# Patient Record
Sex: Male | Born: 1975
Health system: Southern US, Community
[De-identification: ages and names within clinical notes are randomized; demographics above are authoritative.]

## PROBLEM LIST (undated history)

## (undated) DIAGNOSIS — M1990 Unspecified osteoarthritis, unspecified site: Secondary | ICD-10-CM

## (undated) DIAGNOSIS — I10 Essential (primary) hypertension: Secondary | ICD-10-CM

## (undated) DIAGNOSIS — J302 Other seasonal allergic rhinitis: Secondary | ICD-10-CM

## (undated) DIAGNOSIS — F329 Major depressive disorder, single episode, unspecified: Secondary | ICD-10-CM

## (undated) DIAGNOSIS — F419 Anxiety disorder, unspecified: Secondary | ICD-10-CM

## (undated) DIAGNOSIS — J45909 Unspecified asthma, uncomplicated: Secondary | ICD-10-CM

## (undated) DIAGNOSIS — F32A Depression, unspecified: Secondary | ICD-10-CM

## (undated) DIAGNOSIS — G4730 Sleep apnea, unspecified: Secondary | ICD-10-CM

## (undated) DIAGNOSIS — E785 Hyperlipidemia, unspecified: Secondary | ICD-10-CM

## (undated) DIAGNOSIS — J189 Pneumonia, unspecified organism: Secondary | ICD-10-CM

## (undated) DIAGNOSIS — K219 Gastro-esophageal reflux disease without esophagitis: Secondary | ICD-10-CM

## (undated) HISTORY — PX: OTHER SURGICAL HISTORY: SHX169

## (undated) HISTORY — DX: Hyperlipidemia, unspecified: E78.5

## (undated) HISTORY — PX: TONSILECTOMY/ADENOIDECTOMY WITH MYRINGOTOMY: SHX6125

## (undated) HISTORY — PX: VASECTOMY: SHX75

## (undated) HISTORY — DX: Unspecified asthma, uncomplicated: J45.909

## (undated) HISTORY — DX: Essential (primary) hypertension: I10

## (undated) HISTORY — PX: EYE SURGERY: SHX253

---
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
## 2013-09-19 DIAGNOSIS — J189 Pneumonia, unspecified organism: Secondary | ICD-10-CM

## 2013-09-19 HISTORY — DX: Pneumonia, unspecified organism: J18.9

## 2013-11-01 ENCOUNTER — Ambulatory Visit (INDEPENDENT_AMBULATORY_CARE_PROVIDER_SITE_OTHER): Payer: BC Managed Care – HMO | Admitting: Physician Assistant

## 2013-11-01 ENCOUNTER — Encounter: Payer: Self-pay | Admitting: Physician Assistant

## 2013-11-01 VITALS — BP 125/77 | HR 79 | Ht >= 80 in | Wt 315.0 lb

## 2013-11-01 DIAGNOSIS — R635 Abnormal weight gain: Secondary | ICD-10-CM

## 2013-11-01 DIAGNOSIS — I10 Essential (primary) hypertension: Secondary | ICD-10-CM

## 2013-11-01 DIAGNOSIS — F32A Depression, unspecified: Secondary | ICD-10-CM

## 2013-11-01 DIAGNOSIS — F411 Generalized anxiety disorder: Secondary | ICD-10-CM

## 2013-11-01 DIAGNOSIS — F3289 Other specified depressive episodes: Secondary | ICD-10-CM

## 2013-11-01 DIAGNOSIS — F329 Major depressive disorder, single episode, unspecified: Secondary | ICD-10-CM

## 2013-11-01 DIAGNOSIS — K219 Gastro-esophageal reflux disease without esophagitis: Secondary | ICD-10-CM

## 2013-11-01 MED ORDER — BUPROPION HCL ER (XL) 150 MG PO TB24: 150.0000 mg | ORAL_TABLET | Freq: Every day | ORAL | Status: DC

## 2013-11-01 MED ORDER — FLUOXETINE HCL 20 MG PO TABS: 20.0000 mg | ORAL_TABLET | Freq: Every day | ORAL | Status: DC

## 2013-11-01 NOTE — Patient Instructions (Signed)
Celexa 40mg  take 1/2 tab of celexa for days and 1/2 tab for prozac for 7 days then stop celexa and start prozac 20mg  daily.   Start Wellbutrin 150mg  in morning once a day.

## 2013-11-01 NOTE — Progress Notes (Signed)
   Subjective:    Patient ID: Elijah Ford, male    DOB: 1975/11/20, 38 y.o.   MRN: [id number]  HPI Pt is a 38 yo male who presents to the clinic to establish care.   Pt is on medication for depression and anxiety. He feels like it is causing him to gain weight. Started on celexa 2 years ago and was 265 pt is now 3. He does feel like depression is controlled but still suffers with anxiety. He has problems going to sleep at night. Denies suicidal or homicidal thoughts. Never tried anything except celexa.   Marland KitchenGERD controlled on omeprazole.   HTN- no CP, paliptations, headaches, vision changes. Bp controlled with toprol.  Active Ambulatory Problems    Diagnosis Date Noted  . Essential hypertension, benign 11/04/2013  . Anxiety state, unspecified 11/04/2013  . Depression 11/04/2013  . Abnormal weight gain 11/04/2013  . GERD (gastroesophageal reflux disease) 11/04/2013   Resolved Ambulatory Problems    Diagnosis Date Noted  . No Resolved Ambulatory Problems   Past Medical History  Diagnosis Date  . Hypertension   . Hyperlipidemia    . Family History  Problem Relation Age of Onset  . Depression Mother   . Cancer Father   . Alcohol abuse Brother   . Depression Brother   . Alcohol abuse Brother   . Depression Brother    . History   Social History  . Marital Status: Married    Spouse Name: N/A    Number of Children: N/A  . Years of Education: N/A   Occupational History  . Not on file.   Social History Main Topics  . Smoking status: Never Smoker   . Smokeless tobacco: Not on file  . Alcohol Use: No  . Drug Use: No  . Sexual Activity: Yes   Other Topics Concern  . Not on file   Social History Narrative  . No narrative on file      Review of Systems  All other systems reviewed and are negative.       Objective:   Physical Exam  Constitutional: He is oriented to person, place, and time. He appears well-developed and well-nourished.  HENT:  Head:  Normocephalic and atraumatic.  Cardiovascular: Normal rate, regular rhythm and normal heart sounds.   Pulmonary/Chest: Effort normal and breath sounds normal.  Neurological: He is alert and oriented to person, place, and time.  Skin: Skin is dry.  Psychiatric: He has a normal mood and affect. His behavior is normal.          Assessment & Plan:  Depression/Anxietyabnormal weight gain/- PHQ-9 was 11 and GAD-7 was 13. Pt feels like celexa could be causing weight gain. Let's try prozac. Discussed taper of 1/2 celexa for 1 week with 1/2 prozac for one week then stop celexa and start 1 full tab prozac. Also added wellbutrin to help with anxiety as well as motivation for weight loss. Discussed regular exercise and starting to chart calories. Follow up in 6 weeks and will consider fasting labs etc.   HTN- Controlled on TOprol daily.   GERD- continue on omeprazole 40mg  daily.

## 2013-11-04 DIAGNOSIS — E6609 Other obesity due to excess calories: Secondary | ICD-10-CM | POA: Insufficient documentation

## 2013-11-04 DIAGNOSIS — E669 Obesity, unspecified: Secondary | ICD-10-CM | POA: Insufficient documentation

## 2013-11-04 DIAGNOSIS — F329 Major depressive disorder, single episode, unspecified: Secondary | ICD-10-CM | POA: Insufficient documentation

## 2013-11-04 DIAGNOSIS — I10 Essential (primary) hypertension: Secondary | ICD-10-CM | POA: Insufficient documentation

## 2013-11-04 DIAGNOSIS — F411 Generalized anxiety disorder: Secondary | ICD-10-CM

## 2013-11-04 DIAGNOSIS — F419 Anxiety disorder, unspecified: Secondary | ICD-10-CM | POA: Insufficient documentation

## 2013-11-04 DIAGNOSIS — K219 Gastro-esophageal reflux disease without esophagitis: Secondary | ICD-10-CM | POA: Insufficient documentation

## 2013-11-04 DIAGNOSIS — F32A Depression, unspecified: Secondary | ICD-10-CM | POA: Insufficient documentation

## 2013-12-18 ENCOUNTER — Ambulatory Visit (INDEPENDENT_AMBULATORY_CARE_PROVIDER_SITE_OTHER): Payer: Commercial Managed Care - PPO | Admitting: Physician Assistant

## 2013-12-18 ENCOUNTER — Encounter: Payer: Self-pay | Admitting: Physician Assistant

## 2013-12-18 VITALS — BP 137/82 | HR 72 | Ht <= 58 in | Wt 304.0 lb

## 2013-12-18 DIAGNOSIS — K219 Gastro-esophageal reflux disease without esophagitis: Secondary | ICD-10-CM | POA: Diagnosis not present

## 2013-12-18 DIAGNOSIS — R635 Abnormal weight gain: Secondary | ICD-10-CM

## 2013-12-18 DIAGNOSIS — R059 Cough, unspecified: Secondary | ICD-10-CM | POA: Diagnosis not present

## 2013-12-18 DIAGNOSIS — J45909 Unspecified asthma, uncomplicated: Secondary | ICD-10-CM

## 2013-12-18 DIAGNOSIS — I10 Essential (primary) hypertension: Secondary | ICD-10-CM | POA: Diagnosis not present

## 2013-12-18 DIAGNOSIS — E669 Obesity, unspecified: Secondary | ICD-10-CM

## 2013-12-18 DIAGNOSIS — F329 Major depressive disorder, single episode, unspecified: Secondary | ICD-10-CM

## 2013-12-18 DIAGNOSIS — F32A Depression, unspecified: Secondary | ICD-10-CM

## 2013-12-18 DIAGNOSIS — F3289 Other specified depressive episodes: Secondary | ICD-10-CM

## 2013-12-18 DIAGNOSIS — R05 Cough: Secondary | ICD-10-CM | POA: Diagnosis not present

## 2013-12-18 DIAGNOSIS — F411 Generalized anxiety disorder: Secondary | ICD-10-CM

## 2013-12-18 MED ORDER — AZITHROMYCIN 250 MG PO TABS: ORAL_TABLET | ORAL | Status: DC

## 2013-12-18 MED ORDER — PHENTERMINE HCL 37.5 MG PO CAPS: 37.5000 mg | ORAL_CAPSULE | ORAL | Status: DC

## 2013-12-18 MED ORDER — BUPROPION HCL ER (XL) 300 MG PO TB24: 300.0000 mg | ORAL_TABLET | Freq: Every day | ORAL | Status: DC

## 2013-12-18 MED ORDER — OMEPRAZOLE 40 MG PO CPDR: 40.0000 mg | DELAYED_RELEASE_CAPSULE | Freq: Every day | ORAL | Status: DC

## 2013-12-18 MED ORDER — ALBUTEROL SULFATE HFA 108 (90 BASE) MCG/ACT IN AERS: 2.0000 | INHALATION_SPRAY | Freq: Four times a day (QID) | RESPIRATORY_TRACT | Status: DC | PRN

## 2013-12-18 MED ORDER — METOPROLOL SUCCINATE ER 50 MG PO TB24: 50.0000 mg | ORAL_TABLET | Freq: Every day | ORAL | Status: DC

## 2013-12-18 MED ORDER — PREDNISONE 50 MG PO TABS
ORAL_TABLET | ORAL | Status: DC
Start: 2013-12-18 — End: 2014-01-07

## 2013-12-18 MED ORDER — IPRATROPIUM-ALBUTEROL 0.5-2.5 (3) MG/3ML IN SOLN
3.0000 mL | Freq: Once | RESPIRATORY_TRACT | Status: AC
  Administered 2013-12-18: 3 mL via RESPIRATORY_TRACT

## 2013-12-18 MED ORDER — HYDROCODONE-HOMATROPINE 5-1.5 MG/5ML PO SYRP: 5.0000 mL | ORAL_SOLUTION | Freq: Every evening | ORAL | Status: DC | PRN

## 2013-12-18 NOTE — Patient Instructions (Signed)
Could be triggered by allergies: zyrtec OTC daily/flonase 2 sprays each nostril.  Acute Bronchitis Bronchitis is inflammation of the airways that extend from the windpipe into the lungs (bronchi). The inflammation often causes mucus to develop. This leads to a cough, which is the most common symptom of bronchitis.  In acute bronchitis, the condition usually develops suddenly and goes away over time, usually in a couple weeks. Smoking, allergies, and asthma can make bronchitis worse. Repeated episodes of bronchitis may cause further lung problems.  CAUSES Acute bronchitis is most often caused by the same virus that causes a cold. The virus can spread from person to person (contagious).  SIGNS AND SYMPTOMS   Cough.   Fever.   Coughing up mucus.   Body aches.   Chest congestion.   Chills.   Shortness of breath.   Sore throat.  DIAGNOSIS  Acute bronchitis is usually diagnosed through a physical exam. Tests, such as chest X-rays, are sometimes done to rule out other conditions.  TREATMENT  Acute bronchitis usually goes away in a couple weeks. Often times, no medical treatment is necessary. Medicines are sometimes given for relief of fever or cough. Antibiotics are usually not needed but may be prescribed in certain situations. In some cases, an inhaler may be recommended to help reduce shortness of breath and control the cough. A cool mist vaporizer may also be used to help thin bronchial secretions and make it easier to clear the chest.  HOME CARE INSTRUCTIONS  Get plenty of rest.   Drink enough fluids to keep your urine clear or pale yellow (unless you have a medical condition that requires fluid restriction). Increasing fluids may help thin your secretions and will prevent dehydration.   Only take over-the-counter or prescription medicines as directed by your health care provider.   Avoid smoking and secondhand smoke. Exposure to cigarette smoke or irritating chemicals  will make bronchitis worse. If you are a smoker, consider using nicotine gum or skin patches to help control withdrawal symptoms. Quitting smoking will help your lungs heal faster.   Reduce the chances of another bout of acute bronchitis by washing your hands frequently, avoiding people with cold symptoms, and trying not to touch your hands to your mouth, nose, or eyes.   Follow up with your health care provider as directed.  SEEK MEDICAL CARE IF: Your symptoms do not improve after 1 week of treatment.  SEEK IMMEDIATE MEDICAL CARE IF:  You develop an increased fever or chills.   You have chest pain.   You have severe shortness of breath.  You have bloody sputum.   You develop dehydration.  You develop fainting.  You develop repeated vomiting.  You develop a severe headache. MAKE SURE YOU:   Understand these instructions.  Will watch your condition.  Will get help right away if you are not doing well or get worse. Document Released: 10/13/2004 Document Revised: 05/08/2013 Document Reviewed: 02/26/2013 [facility] Patient Information 2014 [place].

## 2013-12-19 DIAGNOSIS — R05 Cough: Secondary | ICD-10-CM

## 2013-12-19 DIAGNOSIS — R059 Cough, unspecified: Secondary | ICD-10-CM

## 2013-12-19 DIAGNOSIS — J45909 Unspecified asthma, uncomplicated: Secondary | ICD-10-CM | POA: Diagnosis not present

## 2013-12-19 MED ORDER — METHYLPREDNISOLONE SODIUM SUCC 125 MG IJ SOLR
125.0000 mg | Freq: Once | INTRAMUSCULAR | Status: AC
  Administered 2013-12-19: 125 mg via INTRAMUSCULAR

## 2013-12-19 NOTE — Progress Notes (Signed)
   Subjective:    Patient ID: Soloman Mckeithan, male    DOB: Jan 25, 1976, 38 y.o.   MRN: [id number]  HPI Pt comes for follow up on anxiety and depression but also has a cough to discuss.   Anxiety and depression- doing much better but still struggles with anxious feelings. Not regularly exercising but does work outside. Does feel more motivated. Depression much better. No side effects to medication. Concern with SSRI was weight gain. With change has lost 11lbs. Motivated to lose more. Would like to discuss options.   Cough for one month. Hx of asthma growing up and does have occasional flares. Chest feels very tight. Some wheezing. Often feels SOB. Cough is productive. Worse when he lays down at night. No sick contacts. Denies any fever, chills, sinus pressure, ear pain or ST. Not tried anything to make better. Not used albuterol.   HTN- taking medication daily. Denies any CP, palpitations, Headaches or vision changes.    Review of Systems     Objective:   Physical Exam  Constitutional: He is oriented to person, place, and time. He appears well-developed and well-nourished.  Obese.   HENT:  Head: Normocephalic and atraumatic.  Right Ear: External ear normal.  Left Ear: External ear normal.  Nose: Nose normal.  Mouth/Throat: Oropharynx is clear and moist.  Uvula removed.   Eyes: Conjunctivae are normal. Right eye exhibits no discharge. Left eye exhibits no discharge.  Neck: Normal range of motion. Neck supple.  Cardiovascular: Normal rate, regular rhythm and normal heart sounds.   Pulmonary/Chest:  Excessive coughing made worse with deep breathing. Wheezing heard bilaterally.   Neurological: He is alert and oriented to person, place, and time.  Skin: Skin is dry.  Psychiatric: He has a normal mood and affect. His behavior is normal.          Assessment & Plan:  Asthmatic bronchitis- peak flows in the red and yellow. duoneb given in office today. Wheezing and breathing improved  after nebulizer. Sent rescue inhaler to pharmacy to use every 2-6 hours. Solumedrol 125mg  IM today. Prednisone 50mg  for 5 days. Zpak for 5 days. Hycodan for bedtime.  Anxiety/depression-PHQ-9 was 13. GAD-7 was 10. Numbers have decreased minimally. increased wellbutrin to 300mg  daily. Continue on prozac 20mg  daily. Refilled medication. Vistaril as needed. Follow up in 3 months.   GERD- omeprazole refilled.   HTN- controlled refilled toprol.   Abnormal weight gain/obesity- started phentermine. Discussed SE of insomnia, anxiety, palpitations, increased HR, increased BP. Follow up in 1 month. Encouraged regular diet and exercise.

## 2014-01-07 ENCOUNTER — Encounter: Payer: Self-pay | Admitting: Physician Assistant

## 2014-01-07 ENCOUNTER — Ambulatory Visit (INDEPENDENT_AMBULATORY_CARE_PROVIDER_SITE_OTHER): Payer: Commercial Managed Care - PPO | Admitting: Physician Assistant

## 2014-01-07 VITALS — BP 124/83 | HR 62 | Ht >= 80 in | Wt 298.0 lb

## 2014-01-07 DIAGNOSIS — R635 Abnormal weight gain: Secondary | ICD-10-CM | POA: Diagnosis not present

## 2014-01-07 MED ORDER — PHENTERMINE HCL 37.5 MG PO CAPS: 37.5000 mg | ORAL_CAPSULE | ORAL | Status: DC

## 2014-01-07 NOTE — Progress Notes (Signed)
   Subjective:    Patient ID: Elijah Ford, male    DOB: Oct 05, 1975, 39 y.o.   MRN: [id number]  HPI Patient is a 38 year old male who presents to the clinic to followup on phentermine and to get her refill. He is also total of 17 pounds in a little over a month on phentermine. He has limited and almost stopped drinking sodas. He has added more walking and with his wife and kids. He is doing some yard work as well for exercise. He feels great and denies any chest pains, palpitations, insomnia.     Review of Systems     Objective:   Physical Exam  Constitutional: He appears well-developed and well-nourished.  Obese  HENT:  Head: Normocephalic and atraumatic.  Cardiovascular: Normal rate, regular rhythm and normal heart sounds.   Pulmonary/Chest: Effort normal and breath sounds normal. He has no wheezes.  Psychiatric: He has a normal mood and affect. His behavior is normal.          Assessment & Plan:  Abnormal weight gain-will refill phentermine for another month. Next month we can do a nurse visit only. Encouraged patient to continue diet and exercise. BMI is 32 today. This is very encouraging. Next goals to get BMI under 30. We'll continue to monitor.

## 2014-02-03 ENCOUNTER — Ambulatory Visit (INDEPENDENT_AMBULATORY_CARE_PROVIDER_SITE_OTHER): Payer: Commercial Managed Care - PPO | Admitting: Physician Assistant

## 2014-02-03 ENCOUNTER — Encounter: Payer: Self-pay | Admitting: Physician Assistant

## 2014-02-03 ENCOUNTER — Ambulatory Visit (INDEPENDENT_AMBULATORY_CARE_PROVIDER_SITE_OTHER): Payer: Commercial Managed Care - PPO

## 2014-02-03 VITALS — BP 108/72 | HR 63 | Ht >= 80 in | Wt 291.0 lb

## 2014-02-03 DIAGNOSIS — F411 Generalized anxiety disorder: Secondary | ICD-10-CM | POA: Diagnosis not present

## 2014-02-03 DIAGNOSIS — B359 Dermatophytosis, unspecified: Secondary | ICD-10-CM

## 2014-02-03 DIAGNOSIS — D2370 Other benign neoplasm of skin of unspecified lower limb, including hip: Secondary | ICD-10-CM

## 2014-02-03 DIAGNOSIS — F3289 Other specified depressive episodes: Secondary | ICD-10-CM

## 2014-02-03 DIAGNOSIS — M542 Cervicalgia: Secondary | ICD-10-CM

## 2014-02-03 DIAGNOSIS — D2270 Melanocytic nevi of unspecified lower limb, including hip: Secondary | ICD-10-CM

## 2014-02-03 DIAGNOSIS — F32A Depression, unspecified: Secondary | ICD-10-CM

## 2014-02-03 DIAGNOSIS — F329 Major depressive disorder, single episode, unspecified: Secondary | ICD-10-CM

## 2014-02-03 MED ORDER — CYCLOBENZAPRINE HCL 10 MG PO TABS: 10.0000 mg | ORAL_TABLET | Freq: Three times a day (TID) | ORAL | Status: DC | PRN

## 2014-02-03 MED ORDER — TERBINAFINE HCL 1 % EX CREA: 1.0000 "application " | TOPICAL_CREAM | Freq: Two times a day (BID) | CUTANEOUS | Status: DC

## 2014-02-03 MED ORDER — IBUPROFEN 800 MG PO TABS: 800.0000 mg | ORAL_TABLET | Freq: Three times a day (TID) | ORAL | Status: DC | PRN
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
## 2014-02-03 NOTE — Patient Instructions (Signed)
Ibuprofen 800mg  as needed up to three times a day for neck pain. Work on [employer] while doing activities that cause worsening neck pain.  Will give flexeril to use at bedtime.   Can use lamisil if does not continue to go away.   Body Ringworm Ringworm (tinea corporis) is a fungal infection of the skin on the body. This infection is not caused by worms, but is actually caused by a fungus. Fungus normally lives on the top of your skin and can be useful. However, in the case of ringworms, the fungus grows out of control and causes a skin infection. It can involve any area of skin on the body and can spread easily from one person to another (contagious). Ringworm is a common problem for children, but it can affect adults as well. Ringworm is also often found in athletes, especially wrestlers who share equipment and mats.  CAUSES  Ringworm of the body is caused by a fungus called dermatophyte. It can spread by:  Touchingother people who are infected.  Touchinginfected pets.  Touching or sharingobjects that have been in contact with the infected person or pet (hats, combs, towels, clothing, sports equipment). SYMPTOMS   Itchy, raised red spots and bumps on the skin.  Ring-shaped rash.  Redness near the border of the rash with a clear center.  Dry and scaly skin on or around the rash. Not every person develops a ring-shaped rash. Some develop only the red, scaly patches. DIAGNOSIS  Most often, ringworm can be diagnosed by performing a skin exam. Your caregiver may choose to take a skin scraping from the affected area. The sample will be examined under the microscope to see if the fungus is present.  TREATMENT  Body ringworm may be treated with a topical antifungal cream or ointment. Sometimes, an antifungal shampoo that can be used on your body is prescribed. You may be prescribed antifungal medicines to take by mouth if your ringworm is severe, keeps coming back, or lasts a long time.   HOME CARE INSTRUCTIONS   Only take over-the-counter or prescription medicines as directed by your caregiver.  Wash the infected area and dry it completely before applying yourcream or ointment.  When using antifungal shampoo to treat the ringworm, leave the shampoo on the body for 3 5 minutes before rinsing.   Wear loose clothing to stop clothes from rubbing and irritating the rash.  Wash or change your bed sheets every night while you have the rash.  Have your pet treated by your veterinarian if it has the same infection. To prevent ringworm:   Practice good hygiene.  Wear sandals or shoes in public places and showers.  Do not share personal items with others.  Avoid touching red patches of skin on other people.  Avoid touching pets that have bald spots or wash your hands after doing so. SEEK MEDICAL CARE IF:   Your rash continues to spread after 7 days of treatment.  Your rash is not gone in 4 weeks.  The area around your rash becomes red, warm, tender, and swollen. Document Released: 09/02/2000 Document Revised: 05/30/2012 Document Reviewed: 03/19/2012 [facility] Patient Information 2014 [place].   Cervical Strain and Sprain (Whiplash) with Rehab Cervical strain and sprains are injuries that commonly occur with "whiplash" injuries. Whiplash occurs when the neck is forcefully whipped backward or forward, such as during a motor vehicle accident. The muscles, ligaments, tendons, discs and nerves of the neck are susceptible to injury when this occurs. SYMPTOMS  Pain or stiffness in the front and/or back of neck  Symptoms may present immediately or up to 24 hours after injury.  Dizziness, headache, nausea and vomiting.  Muscle spasm with soreness and stiffness in the neck.  Tenderness and swelling at the injury site. CAUSES  Whiplash injuries often occur during contact sports or motor vehicle accidents.  RISK INCREASES WITH:  Osteoarthritis of the  spine.  Situations that make head or neck accidents or trauma more likely.  High-risk sports (football, rugby, wrestling, hockey, auto racing, gymnastics, diving, contact karate or boxing).  Poor strength and flexibility of the neck.  Previous neck injury.  Poor tackling technique.  Improperly fitted or padded equipment. PREVENTION  Learn and use proper technique (avoid tackling with the head, spearing and head-butting; use proper falling techniques to avoid landing on the head).  Warm up and stretch properly before activity.  Maintain physical fitness:  Strength, flexibility and endurance.  Cardiovascular fitness.  Wear properly fitted and padded protective equipment, such as padded soft collars, for participation in contact sports. PROGNOSIS  Recovery for cervical strain and sprain injuries is dependent on the extent of the injury. These injuries are usually curable in 1 week to 3 months with appropriate treatment.  RELATED COMPLICATIONS   Temporary numbness and weakness may occur if the nerve roots are damaged, and this may persist until the nerve has completely healed.  Chronic pain due to frequent recurrence of symptoms.  Prolonged healing, especially if activity is resumed too soon (before complete recovery). TREATMENT  Treatment initially involves the use of ice and medication to help reduce pain and inflammation. It is also important to perform strengthening and stretching exercises and modify activities that worsen symptoms so the injury does not get worse. These exercises may be performed at home or with a therapist. For patients who experience severe symptoms, a soft padded collar may be recommended to be worn around the neck.  Improving your posture may help reduce symptoms. Posture improvement includes pulling your chin and abdomen in while sitting or standing. If you are sitting, sit in a firm chair with your buttocks against the back of the chair. While sleeping,  try replacing your pillow with a small towel rolled to 2 inches in diameter, or use a cervical pillow or soft cervical collar. Poor sleeping positions delay healing.  For patients with nerve root damage, which causes numbness or weakness, the use of a cervical traction apparatus may be recommended. Surgery is rarely necessary for these injuries. However, cervical strain and sprains that are present at birth (congenital) may require surgery. MEDICATION   If pain medication is necessary, nonsteroidal anti-inflammatory medications, such as aspirin and ibuprofen, or other minor pain relievers, such as acetaminophen, are often recommended.  Do not take pain medication for 7 days before surgery.  Prescription pain relievers may be given if deemed necessary by your caregiver. Use only as directed and only as much as you need. HEAT AND COLD:   Cold treatment (icing) relieves pain and reduces inflammation. Cold treatment should be applied for 10 to 15 minutes every 2 to 3 hours for inflammation and pain and immediately after any activity that aggravates your symptoms. Use ice packs or an ice massage.  Heat treatment may be used prior to performing the stretching and strengthening activities prescribed by your caregiver, physical therapist, or athletic trainer. Use a heat pack or a warm soak. SEEK MEDICAL CARE IF:   Symptoms get worse or do not improve in 2  weeks despite treatment.  New, unexplained symptoms develop (drugs used in treatment may produce side effects). EXERCISES RANGE OF MOTION (ROM) AND STRETCHING EXERCISES - Cervical Strain and Sprain These exercises may help you when beginning to rehabilitate your injury. In order to successfully resolve your symptoms, you must improve your posture. These exercises are designed to help reduce the forward-head and rounded-shoulder posture which contributes to this condition. Your symptoms may resolve with or without further involvement from your  physician, physical therapist or athletic trainer. While completing these exercises, remember:   Restoring tissue flexibility helps normal motion to return to the joints. This allows healthier, less painful movement and activity.  An effective stretch should be held for at least 20 seconds, although you may need to begin with shorter hold times for comfort.  A stretch should never be painful. You should only feel a gentle lengthening or release in the stretched tissue. STRETCH- Axial Extensors  Lie on your back on the floor. You may bend your knees for comfort. Place a rolled up hand towel or dish towel, about 2 inches in diameter, under the part of your head that makes contact with the floor.  Gently tuck your chin, as if trying to make a "double chin," until you feel a gentle stretch at the base of your head.  Hold __________ seconds. Repeat __________ times. Complete this exercise __________ times per day.  STRETECH - Axial Extension   Stand or sit on a firm surface. Assume a good posture: chest up, shoulders drawn back, abdominal muscles slightly tense, knees unlocked (if standing) and feet hip width apart.  Slowly retract your chin so your head slides back and your chin slightly lowers.Continue to look straight ahead.  You should feel a gentle stretch in the back of your head. Be certain not to feel an aggressive stretch since this can cause headaches later.  Hold for __________ seconds. Repeat __________ times. Complete this exercise __________ times per day. STRETCH  Cervical Side Bend   Stand or sit on a firm surface. Assume a good posture: chest up, shoulders drawn back, abdominal muscles slightly tense, knees unlocked (if standing) and feet hip width apart.  Without letting your nose or shoulders move, slowly tip your right / left ear to your shoulder until your feel a gentle stretch in the muscles on the opposite side of your neck.  Hold __________ seconds. Repeat  __________ times. Complete this exercise __________ times per day. STRETCH  Cervical Rotators   Stand or sit on a firm surface. Assume a good posture: chest up, shoulders drawn back, abdominal muscles slightly tense, knees unlocked (if standing) and feet hip width apart.  Keeping your eyes level with the ground, slowly turn your head until you feel a gentle stretch along the back and opposite side of your neck.  Hold __________ seconds. Repeat __________ times. Complete this exercise __________ times per day. RANGE OF MOTION - Neck Circles   Stand or sit on a firm surface. Assume a good posture: chest up, shoulders drawn back, abdominal muscles slightly tense, knees unlocked (if standing) and feet hip width apart.  Gently roll your head down and around from the back of one shoulder to the back of the other. The motion should never be forced or painful.  Repeat the motion 10-20 times, or until you feel the neck muscles relax and loosen. Repeat __________ times. Complete the exercise __________ times per day. STRENGTHENING EXERCISES - Cervical Strain and Sprain These exercises may help  you when beginning to rehabilitate your injury. They may resolve your symptoms with or without further involvement from your physician, physical therapist or athletic trainer. While completing these exercises, remember:   Muscles can gain both the endurance and the strength needed for everyday activities through controlled exercises.  Complete these exercises as instructed by your physician, physical therapist or athletic trainer. Progress the resistance and repetitions only as guided.  You may experience muscle soreness or fatigue, but the pain or discomfort you are trying to eliminate should never worsen during these exercises. If this pain does worsen, stop and make certain you are following the directions exactly. If the pain is still present after adjustments, discontinue the exercise until you can discuss  the trouble with your clinician. STRENGTH Cervical Flexors, Isometric  Face a wall, standing about 6 inches away. Place a small pillow, a ball about 6-8 inches in diameter, or a folded towel between your forehead and the wall.  Slightly tuck your chin and gently push your forehead into the soft object. Push only with mild to moderate intensity, building up tension gradually. Keep your jaw and forehead relaxed.  Hold 10 to 20 seconds. Keep your breathing relaxed.  Release the tension slowly. Relax your neck muscles completely before you start the next repetition. Repeat __________ times. Complete this exercise __________ times per day. STRENGTH- Cervical Lateral Flexors, Isometric   Stand about 6 inches away from a wall. Place a small pillow, a ball about 6-8 inches in diameter, or a folded towel between the side of your head and the wall.  Slightly tuck your chin and gently tilt your head into the soft object. Push only with mild to moderate intensity, building up tension gradually. Keep your jaw and forehead relaxed.  Hold 10 to 20 seconds. Keep your breathing relaxed.  Release the tension slowly. Relax your neck muscles completely before you start the next repetition. Repeat __________ times. Complete this exercise __________ times per day. STRENGTH  Cervical Extensors, Isometric   Stand about 6 inches away from a wall. Place a small pillow, a ball about 6-8 inches in diameter, or a folded towel between the back of your head and the wall.  Slightly tuck your chin and gently tilt your head back into the soft object. Push only with mild to moderate intensity, building up tension gradually. Keep your jaw and forehead relaxed.  Hold 10 to 20 seconds. Keep your breathing relaxed.  Release the tension slowly. Relax your neck muscles completely before you start the next repetition. Repeat __________ times. Complete this exercise __________ times per day. POSTURE AND BODY MECHANICS  CONSIDERATIONS - Cervical Strain and Sprain Keeping correct posture when sitting, standing or completing your activities will reduce the stress put on different body tissues, allowing injured tissues a chance to heal and limiting painful experiences. The following are general guidelines for improved posture. Your physician or physical therapist will provide you with any instructions specific to your needs. While reading these guidelines, remember:  The exercises prescribed by your provider will help you have the flexibility and strength to maintain correct postures.  The correct posture provides the optimal environment for your joints to work. All of your joints have less wear and tear when properly supported by a spine with good posture. This means you will experience a healthier, less painful body.  Correct posture must be practiced with all of your activities, especially prolonged sitting and standing. Correct posture is as important when doing repetitive low-stress activities (typing)  as it is when doing a single heavy-load activity (lifting). PROLONGED STANDING WHILE SLIGHTLY LEANING FORWARD When completing a task that requires you to lean forward while standing in one place for a long time, place either foot up on a stationary 2-4 inch high object to help maintain the best posture. When both feet are on the ground, the low back tends to lose its slight inward curve. If this curve flattens (or becomes too large), then the back and your other joints will experience too much stress, fatigue more quickly and can cause pain.  RESTING POSITIONS Consider which positions are most painful for you when choosing a resting position. If you have pain with flexion-based activities (sitting, bending, stooping, squatting), choose a position that allows you to rest in a less flexed posture. You would want to avoid curling into a fetal position on your side. If your pain worsens with extension-based activities  (prolonged standing, working overhead), avoid resting in an extended position such as sleeping on your stomach. Most people will find more comfort when they rest with their spine in a more neutral position, neither too rounded nor too arched. Lying on a non-sagging bed on your side with a pillow between your knees, or on your back with a pillow under your knees will often provide some relief. Keep in mind, being in any one position for a prolonged period of time, no matter how correct your posture, can still lead to stiffness. WALKING Walk with an upright posture. Your ears, shoulders and hips should all line-up. OFFICE WORK When working at a desk, create an environment that supports good, upright posture. Without extra support, muscles fatigue and lead to excessive strain on joints and other tissues. CHAIR:  A chair should be able to slide under your desk when your back makes contact with the back of the chair. This allows you to work closely.  The chair's height should allow your eyes to be level with the upper part of your monitor and your hands to be slightly lower than your elbows.  Body position:  Your feet should make contact with the floor. If this is not possible, use a foot rest.  Keep your ears over your shoulders. This will reduce stress on your neck and low back. Document Released: 09/05/2005 Document Revised: 12/31/2012 Document Reviewed: 12/18/2008 [facility] Patient Information 2014 [place], [place].

## 2014-02-07 ENCOUNTER — Telehealth: Payer: Self-pay | Admitting: *Deleted

## 2014-02-07 MED ORDER — CEPHALEXIN 500 MG PO CAPS: 500.0000 mg | ORAL_CAPSULE | Freq: Two times a day (BID) | ORAL | Status: DC

## 2014-02-07 NOTE — Telephone Encounter (Signed)
Wife has called the area where you took off on the leg is red and swollen and she is asking if you will call in an antibiotic?

## 2014-02-07 NOTE — Telephone Encounter (Signed)
Ok to send keflex 500mg  bid for 7 days number 14 no refills to preferred pharmacy.

## 2014-02-07 NOTE — Telephone Encounter (Signed)
Sent to [facility]. Per wife's request.  Oscar La, LPN

## 2014-02-14 ENCOUNTER — Telehealth: Payer: Self-pay | Admitting: *Deleted

## 2014-02-14 DIAGNOSIS — R5383 Other fatigue: Secondary | ICD-10-CM

## 2014-02-14 DIAGNOSIS — N529 Male erectile dysfunction, unspecified: Secondary | ICD-10-CM

## 2014-02-14 DIAGNOSIS — Z0000 Encounter for general adult medical examination without abnormal findings: Secondary | ICD-10-CM

## 2014-02-14 NOTE — Telephone Encounter (Signed)
Labs ordered.

## 2014-02-20 ENCOUNTER — Other Ambulatory Visit: Payer: Self-pay | Admitting: Physician Assistant

## 2014-02-20 LAB — COMPREHENSIVE METABOLIC PANEL
ALT: 16 U/L (ref 0–53)
AST: 14 U/L (ref 0–37)
Albumin: 4.4 g/dL (ref 3.5–5.2)
Alkaline Phosphatase: 44 U/L (ref 39–117)
BILIRUBIN TOTAL: 0.6 mg/dL (ref 0.2–1.2)
BUN: 11 mg/dL (ref 6–23)
CO2: 26 meq/L (ref 19–32)
CREATININE: 1.15 mg/dL (ref 0.50–1.35)
Calcium: 9.6 mg/dL (ref 8.4–10.5)
Chloride: 103 mEq/L (ref 96–112)
Glucose, Bld: 101 mg/dL — ABNORMAL HIGH (ref 70–99)
Potassium: 4.1 mEq/L (ref 3.5–5.3)
Sodium: 140 mEq/L (ref 135–145)
Total Protein: 6.8 g/dL (ref 6.0–8.3)

## 2014-02-20 LAB — LIPID PANEL
Cholesterol: 217 mg/dL — ABNORMAL HIGH (ref 0–200)
HDL: 36 mg/dL — ABNORMAL LOW (ref >39)
LDL Cholesterol: 128 mg/dL — ABNORMAL HIGH (ref 0–99)
TRIGLYCERIDES: 266 mg/dL — AB (ref <150)
Total CHOL/HDL Ratio: 6 Ratio
VLDL: 53 mg/dL — ABNORMAL HIGH (ref 0–40)

## 2014-02-20 LAB — TESTOSTERONE, FREE, TOTAL, SHBG
Sex Hormone Binding: 16 nmol/L (ref 13–71)
TESTOSTERONE FREE: 86.1 pg/mL (ref 47.0–244.0)
Testosterone-% Free: 2.8 % (ref 1.6–2.9)
Testosterone: 308 ng/dL (ref 300–890)

## 2014-04-07 ENCOUNTER — Other Ambulatory Visit: Payer: Self-pay | Admitting: Physician Assistant

## 2014-04-22 ENCOUNTER — Encounter: Payer: Self-pay | Admitting: Emergency Medicine

## 2014-04-22 ENCOUNTER — Emergency Department
Admission: EM | Admit: 2014-04-22 | Discharge: 2014-04-22 | Disposition: A | Discharge status: Home / Self Care | Payer: 59 | Source: Home / Self Care

## 2014-04-22 DIAGNOSIS — R5383 Other fatigue: Secondary | ICD-10-CM

## 2014-04-22 DIAGNOSIS — R5381 Other malaise: Secondary | ICD-10-CM

## 2014-04-22 DIAGNOSIS — J209 Acute bronchitis, unspecified: Secondary | ICD-10-CM

## 2014-04-22 LAB — POCT CBC W AUTO DIFF (K'VILLE URGENT CARE)

## 2014-04-22 LAB — POCT MONO SCREEN (KUC): MONO, POC: NEGATIVE

## 2014-04-22 MED ORDER — ALBUTEROL SULFATE (2.5 MG/3ML) 0.083% IN NEBU
2.5000 mg | INHALATION_SOLUTION | Freq: Once | RESPIRATORY_TRACT | Status: AC
  Administered 2014-04-22: 2.5 mg via RESPIRATORY_TRACT

## 2014-04-22 MED ORDER — AZITHROMYCIN 250 MG PO TABS: ORAL_TABLET | ORAL | Status: DC

## 2014-04-22 MED ORDER — ALBUTEROL SULFATE HFA 108 (90 BASE) MCG/ACT IN AERS: 2.0000 | INHALATION_SPRAY | Freq: Four times a day (QID) | RESPIRATORY_TRACT | Status: DC | PRN

## 2014-04-22 MED ORDER — HYDROCODONE-HOMATROPINE 5-1.5 MG/5ML PO SYRP: 5.0000 mL | ORAL_SOLUTION | Freq: Three times a day (TID) | ORAL | Status: DC | PRN

## 2014-04-22 NOTE — ED Notes (Signed)
Crews complains of fever, chills, body aches, dizziness, headaches, diarrhea, productive cough with green sputum and shortness of breath for 6 days.

## 2014-04-22 NOTE — Discharge Instructions (Signed)

## 2014-04-22 NOTE — ED Provider Notes (Signed)
CSN: [id number]     Arrival date & time 04/22/14  0941 History   None    Chief Complaint  Patient presents with  . Fever  . Generalized Body Aches  . Cough   (Consider location/radiation/quality/duration/timing/severity/associated sxs/prior Treatment) HPI Pt is a 38 yo male who presents to the clinic with 6 days of fever, chill, body aches, dizziness, cough, headaches, SOB and diarrhea. No fever in office today but reports fever around 100 at home. Taking dayquil, nyquil, and motrin. Son has mono. Denies any ST, ear pain, nausea or vomiting. Some sinus pressure. Cough is productive with green sputum.    Past Medical History  Diagnosis Date  . Hypertension   . Hyperlipidemia    Past Surgical History  Procedure Laterality Date  . Uvula removal    . Tonsilectomy/adenoidectomy with myringotomy     Family History  Problem Relation Age of Onset  . Depression Mother   . Cancer Father   . Alcohol abuse Brother   . Depression Brother   . Alcohol abuse Brother   . Depression Brother    History  Substance Use Topics  . Smoking status: Never Smoker   . Smokeless tobacco: Not on file  . Alcohol Use: No    Review of Systems  All other systems reviewed and are negative.   Allergies  Review of patient's allergies indicates no known allergies.  Home Medications   Prior to Admission medications   Medication Sig Start Date End Date Taking? Authorizing Provider  albuterol (PROVENTIL HFA;VENTOLIN HFA) 108 (90 BASE) MCG/ACT inhaler Inhale 2 puffs into the lungs every 6 (six) hours as needed for wheezing or shortness of breath. 12/18/13  Yes Mannat Benedetti L Mae Cianci, PA-C  buPROPion (WELLBUTRIN XL) 300 MG 24 hr tablet TAKE 1 TABLET BY MOUTH DAILY. 04/07/14  Yes Demarrio Menges L Tresia Revolorio, PA-C  cyclobenzaprine (FLEXERIL) 10 MG tablet Take 1 tablet (10 mg total) by mouth 3 (three) times daily as needed for muscle spasms. 02/03/14  Yes Kobee Medlen L Tyshon Fanning, PA-C  FLUoxetine (PROZAC) 20 MG capsule TAKE 1 CAPSULE BY  MOUTH DAILY. 02/20/14  Yes Omeka Holben L Omaree Fuqua, PA-C  hydrOXYzine (VISTARIL) 50 MG capsule Take 50 mg by mouth 2 (two) times daily.   Yes Historical Provider, MD  ibuprofen (ADVIL,MOTRIN) 800 MG tablet Take 1 tablet (800 mg total) by mouth every 8 (eight) hours as needed. 02/03/14  Yes Jamerson Vonbargen L Natan Hartog, PA-C  metoprolol succinate (TOPROL-XL) 50 MG 24 hr tablet Take 1 tablet (50 mg total) by mouth daily. Take with or immediately following a meal. 12/18/13  Yes Isidoro Santillana L Ashante Snelling, PA-C  omeprazole (PRILOSEC) 40 MG capsule Take 1 capsule (40 mg total) by mouth daily. 12/18/13  Yes Saunders Arlington L Trenden Hazelrigg, PA-C  phentermine 37.5 MG capsule Take 1 capsule (37.5 mg total) by mouth every morning. 01/07/14  Yes Lamija Besse L Cyenna Rebello, PA-C  terbinafine (LAMISIL) 1 % cream Apply 1 application topically 2 (two) times daily. 02/03/14  Yes Diani Jillson L Riese Hellard, PA-C  Vilazodone HCl (VIIBRYD) 10 & 20 & 40 MG KIT Take by mouth.   Yes Historical Provider, MD  albuterol (PROVENTIL HFA;VENTOLIN HFA) 108 (90 BASE) MCG/ACT inhaler Inhale 2 puffs into the lungs every 6 (six) hours as needed for wheezing. And cough. 04/22/14   Ailana Cuadrado L Sharnika Binney, PA-C  azithromycin (ZITHROMAX Z-PAK) 250 MG tablet Take 2 tablets (500 mg) on  Day 1,  followed by 1 tablet (250 mg) once daily on Days 2 through 5. 04/22/14   Donella Stade,  PA-C  cephALEXin (KEFLEX) 500 MG capsule Take 1 capsule (500 mg total) by mouth 2 (two) times daily. 02/07/14   Sharlie Shreffler L Karla Pavone, PA-C  HYDROcodone-homatropine (HYCODAN) 5-1.5 MG/5ML syrup Take 5 mLs by mouth every 8 (eight) hours as needed for cough. 04/22/14   Wavie Hashimi L Briggs Edelen, PA-C   BP 128/84  Pulse 60  Temp(Src) 98.2 F (36.8 C) (Oral)  Ht 6' 8"  (2.032 m)  SpO2 97% Physical Exam  Constitutional: He is oriented to person, place, and time. He appears well-developed and well-nourished.  HENT:  Head: Normocephalic and atraumatic.  Right Ear: External ear normal.  Left Ear: External ear normal.  TM's clear.  No uvula present.  Oropharynx  erythematous with no tonsillar exudate or swelling.  Maxillary sinus tenderness bilaterally.  Nasal turbinates red and swollen.    Eyes: Conjunctivae are normal. Right eye exhibits no discharge. Left eye exhibits no discharge.  Neck: Normal range of motion. Neck supple.  Cardiovascular: Normal rate, regular rhythm and normal heart sounds.   Pulmonary/Chest: He has no wheezes.  Coarse breath sounds. No wheezing.   Abdominal: Soft. Bowel sounds are normal.  Generalized abdominal tenderness.   Lymphadenopathy:    He has no cervical adenopathy.  Neurological: He is alert and oriented to person, place, and time.  Skin: Skin is warm and dry.  Psychiatric: He has a normal mood and affect. His behavior is normal.    ED Course  Procedures (including critical care time) Labs Review Labs Reviewed  POCT CBC W AUTO DIFF ([place]) - Normal  POCT MONO SCREEN ([place]) - Normal  TSH    Imaging Review No results found.   MDM   1. Acute bronchitis, unspecified organism    WBC 5.0, HgB 16.8. Platelets 240.  Mono negative.  Nebulizer albuterol given today. Pt reports significant improvement.  zpak given for 5 days.  Albuterol rescue inhaler sent home with patient to use as needed 2puffs.  Hycodan for cough.  Encouraged mucinex D twice a day for congestion.  Follow up with PCP in 3-5 days if not improving.       Pt request thyroid to be checked. He is my patient in primary care as well ordered to have evaluated for overall fatigue.    Donella Stade, PA-C 04/22/14 1246

## 2014-04-23 LAB — TSH: TSH: 1.392 u[IU]/mL (ref 0.350–4.500)

## 2014-04-23 NOTE — ED Provider Notes (Signed)
Agree with exam, assessment, and plan.   Kandra Nicolas, MD 04/23/14 [phone]

## 2014-04-25 ENCOUNTER — Telehealth: Payer: Self-pay | Admitting: Emergency Medicine

## 2014-04-25 NOTE — ED Notes (Signed)
Inquired about patient's status; encourage them to call with questions/concerns.  

## 2014-05-06 ENCOUNTER — Other Ambulatory Visit: Payer: Self-pay | Admitting: Physician Assistant

## 2014-06-04 ENCOUNTER — Other Ambulatory Visit: Payer: Self-pay | Admitting: Emergency Medicine

## 2014-06-04 MED ORDER — OMEPRAZOLE 40 MG PO CPDR: 40.0000 mg | DELAYED_RELEASE_CAPSULE | Freq: Every day | ORAL | Status: DC

## 2014-06-04 MED ORDER — FLUOXETINE HCL 20 MG PO CAPS: 20.0000 mg | ORAL_CAPSULE | Freq: Every day | ORAL | Status: DC

## 2014-06-04 MED ORDER — BUPROPION HCL ER (XL) 300 MG PO TB24: 300.0000 mg | ORAL_TABLET | Freq: Every day | ORAL | Status: DC

## 2014-06-04 MED ORDER — METOPROLOL SUCCINATE ER 50 MG PO TB24: 50.0000 mg | ORAL_TABLET | Freq: Every day | ORAL | Status: DC

## 2014-06-04 NOTE — Telephone Encounter (Signed)
Elijah Ford, his wife, that pharmacy information has been updated; refills called in; necessary for patient to be evaluated for his hypertension within the next 30-45 days. She understands and will make appointment.

## 2014-06-18 ENCOUNTER — Ambulatory Visit (INDEPENDENT_AMBULATORY_CARE_PROVIDER_SITE_OTHER): Payer: Managed Care, Other (non HMO) | Admitting: Physician Assistant

## 2014-06-18 ENCOUNTER — Ambulatory Visit (INDEPENDENT_AMBULATORY_CARE_PROVIDER_SITE_OTHER): Payer: Managed Care, Other (non HMO)

## 2014-06-18 ENCOUNTER — Encounter: Payer: Self-pay | Admitting: Physician Assistant

## 2014-06-18 VITALS — BP 115/83 | HR 62 | Temp 98.1°F | Ht >= 80 in | Wt 297.0 lb

## 2014-06-18 DIAGNOSIS — M79671 Pain in right foot: Secondary | ICD-10-CM

## 2014-06-18 DIAGNOSIS — M19079 Primary osteoarthritis, unspecified ankle and foot: Secondary | ICD-10-CM

## 2014-06-18 DIAGNOSIS — M7730 Calcaneal spur, unspecified foot: Secondary | ICD-10-CM

## 2014-06-18 DIAGNOSIS — M79609 Pain in unspecified limb: Secondary | ICD-10-CM

## 2014-06-18 DIAGNOSIS — F329 Major depressive disorder, single episode, unspecified: Secondary | ICD-10-CM

## 2014-06-18 DIAGNOSIS — R635 Abnormal weight gain: Secondary | ICD-10-CM

## 2014-06-18 DIAGNOSIS — R2241 Localized swelling, mass and lump, right lower limb: Secondary | ICD-10-CM

## 2014-06-18 DIAGNOSIS — M19072 Primary osteoarthritis, left ankle and foot: Secondary | ICD-10-CM

## 2014-06-18 DIAGNOSIS — E669 Obesity, unspecified: Secondary | ICD-10-CM

## 2014-06-18 DIAGNOSIS — M19071 Primary osteoarthritis, right ankle and foot: Secondary | ICD-10-CM

## 2014-06-18 DIAGNOSIS — F32A Depression, unspecified: Secondary | ICD-10-CM

## 2014-06-18 DIAGNOSIS — F411 Generalized anxiety disorder: Secondary | ICD-10-CM

## 2014-06-18 DIAGNOSIS — I10 Essential (primary) hypertension: Secondary | ICD-10-CM

## 2014-06-18 DIAGNOSIS — F3289 Other specified depressive episodes: Secondary | ICD-10-CM

## 2014-06-18 MED ORDER — PHENTERMINE HCL 37.5 MG PO CAPS: 37.5000 mg | ORAL_CAPSULE | ORAL | Status: DC

## 2014-06-18 MED ORDER — BUPROPION HCL ER (XL) 300 MG PO TB24: 300.0000 mg | ORAL_TABLET | Freq: Every day | ORAL | Status: DC

## 2014-06-18 MED ORDER — FLUOXETINE HCL 40 MG PO CAPS: 40.0000 mg | ORAL_CAPSULE | Freq: Every day | ORAL | Status: DC

## 2014-06-18 NOTE — Patient Instructions (Signed)
Ibuprofen 800mg  up to three times.

## 2014-06-20 ENCOUNTER — Encounter: Payer: Self-pay | Admitting: Physician Assistant

## 2014-06-20 DIAGNOSIS — M19071 Primary osteoarthritis, right ankle and foot: Secondary | ICD-10-CM | POA: Insufficient documentation

## 2014-06-20 DIAGNOSIS — M19072 Primary osteoarthritis, left ankle and foot: Secondary | ICD-10-CM

## 2014-06-22 NOTE — Progress Notes (Signed)
   Subjective:    Patient ID: Elijah Ford, male    DOB: September 02, 1976, 38 y.o.   MRN: [id number]  HPI Pt presents to the clinic for follow up and to discuss bilateral dorsal foot irritation.   Pt had been losing weight but never followed back up for phentermine. Only on for 2 months and lost 20lbs. Continues to walk 14,000 steps a day and weight lift. Would like to try phentermine for a few more months. No side effects of medication.   HTN- doing well. Denies any CP, palpitations, vision changes or headaches. On metroprolol.   Anxiety and depression- on wellbutrin and prozac. Still finds himself down a lot. Mind often goes to the worse scenerio.   Having bilateral foot irritation over first metatarsal joint of both feet. Knot of foot seems to be getting larger and larger. Not tried anyting to make better. Walking for long distances makes worse.     Review of Systems  All other systems reviewed and are negative.      Objective:   Physical Exam  Constitutional: He is oriented to person, place, and time. He appears well-developed and well-nourished.  Obesity.   HENT:  Head: Normocephalic and atraumatic.  Cardiovascular: Normal rate, regular rhythm and normal heart sounds.   Pulmonary/Chest: Effort normal and breath sounds normal. He has no wheezes.  Musculoskeletal:  Larger than normal first metatarsal joint to palpation.right bigger than left. Right more sensitive than left.   Neurological: He is alert and oriented to person, place, and time.  Skin: Skin is dry.  Psychiatric: He has a normal mood and affect. His behavior is normal.          Assessment & Plan:  Abnormal weight/obesity- refilled phentermine. Recheck in one month.   HTn- refilled for 6 months.   Anxiety/depression- increased prozac to 40mg  daily. Continue wellbutrin. Follow up in 2 months.   Osteoarthritis of dorsal feet- xray over feet showed bone spurring and arthritis. Will send to T for injection. Avoid  shoes that rub the dorsal foot over that area. Consider ibuprofen when painful.

## 2014-06-27 ENCOUNTER — Encounter: Payer: Self-pay | Admitting: Sports Medicine

## 2014-06-27 ENCOUNTER — Ambulatory Visit (INDEPENDENT_AMBULATORY_CARE_PROVIDER_SITE_OTHER): Payer: Managed Care, Other (non HMO)

## 2014-06-27 ENCOUNTER — Ambulatory Visit (INDEPENDENT_AMBULATORY_CARE_PROVIDER_SITE_OTHER): Payer: Managed Care, Other (non HMO) | Admitting: Sports Medicine

## 2014-06-27 VITALS — BP 122/79 | HR 58 | Ht >= 80 in | Wt 291.0 lb

## 2014-06-27 DIAGNOSIS — R05 Cough: Secondary | ICD-10-CM

## 2014-06-27 DIAGNOSIS — M19072 Primary osteoarthritis, left ankle and foot: Secondary | ICD-10-CM

## 2014-06-27 DIAGNOSIS — M19071 Primary osteoarthritis, right ankle and foot: Secondary | ICD-10-CM

## 2014-06-27 DIAGNOSIS — R509 Fever, unspecified: Secondary | ICD-10-CM

## 2014-06-27 DIAGNOSIS — M6588 Other synovitis and tenosynovitis, other site: Secondary | ICD-10-CM

## 2014-06-27 DIAGNOSIS — J209 Acute bronchitis, unspecified: Secondary | ICD-10-CM

## 2014-06-27 DIAGNOSIS — M7750 Other enthesopathy of unspecified foot: Secondary | ICD-10-CM | POA: Insufficient documentation

## 2014-06-27 DIAGNOSIS — B353 Tinea pedis: Secondary | ICD-10-CM

## 2014-06-27 MED ORDER — BENZONATATE 200 MG PO CAPS: 200.0000 mg | ORAL_CAPSULE | Freq: Three times a day (TID) | ORAL | Status: DC | PRN

## 2014-06-27 MED ORDER — AZITHROMYCIN 250 MG PO TABS: ORAL_TABLET | ORAL | Status: DC

## 2014-06-27 MED ORDER — TERBINAFINE HCL 250 MG PO TABS: 250.0000 mg | ORAL_TABLET | Freq: Every day | ORAL | Status: DC

## 2014-06-27 NOTE — Assessment & Plan Note (Addendum)
Terbinafine 250 mg daily. He does have a hyperpigmented lesion between his fourth and fifth toes of the left foot, after the toenail fungus is treated I would like to consider a biopsy of this lesion.

## 2014-06-27 NOTE — Assessment & Plan Note (Addendum)
There is some osteoarthritis however this is not his pain generator.

## 2014-06-27 NOTE — Progress Notes (Signed)
Subjective:    I'm seeing this patient as a consultation for:  Iran Planas, PA-C  CC: Bilateral foot pain  HPI: This is a very pleasant 38 year old male with multiple complaints. For months now he's had swelling and pain over the dorsum of his feet, localized over the tarsometatarsal joint, it is tender to palpate, and it hurts when his shoes rub across. He did have x-rays done that showed some arthritis at the tarsometatarsal joints. Pain is moderate, persistent.  Cough: Moderate, persistent, present for several days. No fevers, chills, night sweats, weight loss, cough is nonproductive. No skin rashes or GI symptoms.  Foot rash: Pruritic, he does have an area in the 4/5 interphalangeal space, of hyperpigmentation..  Past medical history, Surgical history, Family history not pertinant except as noted below, Social history, Allergies, and medications have been entered into the medical record, reviewed, and no changes needed.   Review of Systems: No headache, visual changes, nausea, vomiting, diarrhea, constipation, dizziness, abdominal pain, skin rash, fevers, chills, night sweats, weight loss, swollen lymph nodes, body aches, joint swelling, muscle aches, chest pain, shortness of breath, mood changes, visual or auditory hallucinations.   Objective:   General: Well Developed, well nourished, and in no acute distress.  Neuro/Psych: Alert and oriented x3, extra-ocular muscles intact, able to move all 4 extremities, sensation grossly intact. Skin: Warm and dry, no rashes noted.  Respiratory: Not using accessory muscles, speaking in full sentences, trachea midline.  Cardiovascular: Pulses palpable, no extremity edema. Abdomen: Does not appear distended. Bilateral feet: Visible swelling over the dorsum of the tarsometatarsal joint, over the extensor hallucis longus tendon bilaterally. There is also scaling over the entire foot. On the left foot between the fourth and fifth digits there is a  hyperpigmented 1 cm macule. Range of motion is full in all directions. Strength is 5/5 in all directions. No hallux valgus. Bilateral pes cavus No abnormal callus noted. No pain over the navicular prominence, or base of fifth metatarsal. No tenderness to palpation of the calcaneal insertion of plantar fascia. No pain at the Achilles insertion. No pain over the calcaneal bursa. No pain of the retrocalcaneal bursa. No tenderness to palpation over the tarsals, metatarsals, or phalanges. No hallux rigidus or limitus. No tenderness palpation over interphalangeal joints. No pain with compression of the metatarsal heads. Neurovascularly intact distally.  Procedure: Real-time Ultrasound Guided Injection of left extensor hallucis longus tendon sheath Device: GE Logiq E  Verbal informed consent obtained.  Time-out conducted.  Noted no overlying erythema, induration, or other signs of local infection.  Skin prepped in a sterile fashion.  Local anesthesia: Topical Ethyl chloride.  With sterile technique and under real time ultrasound guidance: Initially suspected swelling was from a first metatarsocuneiform joint effusion however noted fluid pocket in the extensor hallucis longus tendon sheath. 0.5 cc Kenalog 40, 0.5 cc lidocaine injected easily.  Completed without difficulty  Pain immediately resolved suggesting accurate placement of the medication.  Advised to call if fevers/chills, erythema, induration, drainage, or persistent bleeding.  Images permanently stored and available for review in the ultrasound unit.  Impression: Technically successful ultrasound guided injection.  Procedure: Real-time Ultrasound Guided Injection of right extensor hallucis longus tendon sheath Device: GE Logiq E  Verbal informed consent obtained.  Time-out conducted.  Noted no overlying erythema, induration, or other signs of local infection.  Skin prepped in a sterile fashion.  Local anesthesia: Topical Ethyl  chloride.  With sterile technique and under real time ultrasound guidance: Initially suspected  swelling was from a first metatarsocuneiform joint effusion however noted fluid pocket in the extensor hallucis longus tendon sheath. 0.5 cc Kenalog 40, 0.5 cc lidocaine injected easily.  Completed without difficulty  Pain immediately resolved suggesting accurate placement of the medication.  Advised to call if fevers/chills, erythema, induration, drainage, or persistent bleeding.  Images permanently stored and available for review in the ultrasound unit.  Impression: Technically successful ultrasound guided injection.  Impression and Recommendations:   This case required medical decision making of moderate complexity.

## 2014-06-27 NOTE — Assessment & Plan Note (Signed)
Noted significant fluid in the extensor hallucis longus tendon sheath bilaterally. Bilateral extensor hallucis longus tendon sheath injections, return for custom orthotics.

## 2014-06-27 NOTE — Assessment & Plan Note (Signed)
Persistent and severe. Benzonatate, chest x-ray, azithromycin. Return to see PCP if no better in 2 weeks.

## 2014-07-08 ENCOUNTER — Ambulatory Visit (INDEPENDENT_AMBULATORY_CARE_PROVIDER_SITE_OTHER): Payer: Managed Care, Other (non HMO)

## 2014-07-08 ENCOUNTER — Encounter: Payer: Self-pay | Admitting: Sports Medicine

## 2014-07-08 ENCOUNTER — Ambulatory Visit (INDEPENDENT_AMBULATORY_CARE_PROVIDER_SITE_OTHER): Payer: Managed Care, Other (non HMO) | Admitting: Sports Medicine

## 2014-07-08 VITALS — BP 125/77 | HR 71 | Ht >= 80 in | Wt 290.0 lb

## 2014-07-08 DIAGNOSIS — M84375A Stress fracture, left foot, initial encounter for fracture: Secondary | ICD-10-CM | POA: Insufficient documentation

## 2014-07-08 DIAGNOSIS — M6588 Other synovitis and tenosynovitis, other site: Secondary | ICD-10-CM

## 2014-07-08 DIAGNOSIS — R7309 Other abnormal glucose: Secondary | ICD-10-CM

## 2014-07-08 DIAGNOSIS — R200 Anesthesia of skin: Secondary | ICD-10-CM

## 2014-07-08 DIAGNOSIS — R7303 Prediabetes: Secondary | ICD-10-CM

## 2014-07-08 DIAGNOSIS — M109 Gout, unspecified: Secondary | ICD-10-CM

## 2014-07-08 DIAGNOSIS — R208 Other disturbances of skin sensation: Secondary | ICD-10-CM

## 2014-07-08 DIAGNOSIS — M545 Low back pain: Secondary | ICD-10-CM

## 2014-07-08 DIAGNOSIS — B353 Tinea pedis: Secondary | ICD-10-CM

## 2014-07-08 DIAGNOSIS — M7750 Other enthesopathy of unspecified foot: Secondary | ICD-10-CM

## 2014-07-08 LAB — COMPREHENSIVE METABOLIC PANEL WITH GFR
ALT: 17 U/L (ref 0–53)
AST: 16 U/L (ref 0–37)
Calcium: 9.6 mg/dL (ref 8.4–10.5)
Chloride: 106 meq/L (ref 96–112)
Creat: 1.2 mg/dL (ref 0.50–1.35)
Sodium: 140 meq/L (ref 135–145)
Total Bilirubin: 0.4 mg/dL (ref 0.2–1.2)
Total Protein: 7.1 g/dL (ref 6.0–8.3)

## 2014-07-08 LAB — CBC
HCT: 48.6 % (ref 39.0–52.0)
Hemoglobin: 17 g/dL (ref 13.0–17.0)
MCH: 31.3 pg (ref 26.0–34.0)
MCHC: 35 g/dL (ref 30.0–36.0)
MCV: 89.3 fL (ref 78.0–100.0)
Platelets: 261 K/uL (ref 150–400)
RBC: 5.44 MIL/uL (ref 4.22–5.81)
RDW: 14 % (ref 11.5–15.5)
WBC: 7.6 10*3/uL (ref 4.0–10.5)

## 2014-07-08 LAB — COMPREHENSIVE METABOLIC PANEL
Albumin: 4.7 g/dL (ref 3.5–5.2)
Alkaline Phosphatase: 55 U/L (ref 39–117)
BUN: 16 mg/dL (ref 6–23)
CO2: 20 mEq/L (ref 19–32)
Glucose, Bld: 98 mg/dL (ref 70–99)
Potassium: 4.4 mEq/L (ref 3.5–5.3)

## 2014-07-08 LAB — VITAMIN B12: Vitamin B-12: 467 pg/mL (ref 211–911)

## 2014-07-08 LAB — URIC ACID: Uric Acid, Serum: 8.5 mg/dL — ABNORMAL HIGH (ref 4.0–7.8)

## 2014-07-08 LAB — TSH: TSH: 0.827 u[IU]/mL (ref 0.350–4.500)

## 2014-07-08 MED ORDER — TERBINAFINE HCL 250 MG PO TABS: 250.0000 mg | ORAL_TABLET | Freq: Every day | ORAL | Status: DC

## 2014-07-08 MED ORDER — PREDNISONE 50 MG PO TABS: ORAL_TABLET | ORAL | Status: DC

## 2014-07-08 NOTE — Progress Notes (Signed)

## 2014-07-08 NOTE — Assessment & Plan Note (Signed)
Improved with extensor pollicis longus tendon sheath injection bilaterally. Still with some pain. Custom orthotics as above.

## 2014-07-08 NOTE — Assessment & Plan Note (Signed)
Bilateral, I do suspect that this is radicular, prednisone, lumbar spine x-rays. Also checking blood work.

## 2014-07-08 NOTE — Assessment & Plan Note (Signed)
Continue Lamisil.

## 2014-07-09 DIAGNOSIS — R7303 Prediabetes: Secondary | ICD-10-CM | POA: Insufficient documentation

## 2014-07-09 DIAGNOSIS — M109 Gout, unspecified: Secondary | ICD-10-CM | POA: Insufficient documentation

## 2014-07-09 LAB — VITAMIN D 25 HYDROXY (VIT D DEFICIENCY, FRACTURES): Vit D, 25-Hydroxy: 26 ng/mL — ABNORMAL LOW (ref 30–89)

## 2014-07-09 LAB — HEMOGLOBIN A1C
Hgb A1c MFr Bld: 6 % — ABNORMAL HIGH (ref <5.7)
Mean Plasma Glucose: 126 mg/dL — ABNORMAL HIGH (ref <117)

## 2014-07-09 MED ORDER — VITAMIN D (ERGOCALCIFEROL) 1.25 MG (50000 UNIT) PO CAPS: 50000.0000 [IU] | ORAL_CAPSULE | ORAL | Status: DC

## 2014-07-09 NOTE — Addendum Note (Signed)
Addended by: Silverio Decamp on: 07/09/2014 11:44 AM   Modules accepted: Orders

## 2014-07-09 NOTE — Assessment & Plan Note (Signed)
We will consider lipid-lowering therapy in the future.

## 2014-07-10 ENCOUNTER — Other Ambulatory Visit: Payer: Self-pay | Admitting: Physician Assistant

## 2014-07-17 ENCOUNTER — Ambulatory Visit (INDEPENDENT_AMBULATORY_CARE_PROVIDER_SITE_OTHER): Payer: Managed Care, Other (non HMO) | Admitting: Sports Medicine

## 2014-07-17 ENCOUNTER — Encounter: Payer: Self-pay | Admitting: Sports Medicine

## 2014-07-17 VITALS — BP 106/74 | HR 66 | Ht >= 80 in | Wt 289.0 lb

## 2014-07-17 DIAGNOSIS — M7750 Other enthesopathy of unspecified foot: Secondary | ICD-10-CM

## 2014-07-17 DIAGNOSIS — M6588 Other synovitis and tenosynovitis, other site: Secondary | ICD-10-CM

## 2014-07-17 DIAGNOSIS — Z9889 Other specified postprocedural states: Secondary | ICD-10-CM | POA: Insufficient documentation

## 2014-07-17 DIAGNOSIS — M109 Gout, unspecified: Secondary | ICD-10-CM

## 2014-07-17 DIAGNOSIS — G4733 Obstructive sleep apnea (adult) (pediatric): Secondary | ICD-10-CM | POA: Insufficient documentation

## 2014-07-17 NOTE — Assessment & Plan Note (Signed)
Still with persistent pain dorsally. We can address this further at the future visit.

## 2014-07-17 NOTE — Progress Notes (Signed)
  Subjective:    CC: Bilateral knee swelling  HPI: Knee pain: Swelling started a day or 2 ago, he does have a history of an elevated uric acid, swelling is moderate, persistent, no mechanical symptoms, no constitutional symptoms.  Foot pain: Persistent pain over the extensor hallucis longus, there was some swelling that improved a little bit with an injection at the last visit. He is overall doing well with his custom orthotics. We will also discuss his foot numbness at a future visit.  Past medical history, Surgical history, Family history not pertinant except as noted below, Social history, Allergies, and medications have been entered into the medical record, reviewed, and no changes needed.   Review of Systems: No fevers, chills, night sweats, weight loss, chest pain, or shortness of breath.   Objective:    General: Well Developed, well nourished, and in no acute distress.  Neuro: Alert and oriented x3, extra-ocular muscles intact, sensation grossly intact.  HEENT: Normocephalic, atraumatic, pupils equal round reactive to light, neck supple, no masses, no lymphadenopathy, thyroid nonpalpable.  Skin: Warm and dry, no rashes. Cardiac: Regular rate and rhythm, no murmurs rubs or gallops, no lower extremity edema.  Respiratory: Clear to auscultation bilaterally. Not using accessory muscles, speaking in full sentences. Bilateral knees: Visible and palpable effusion. Palpation normal with no warmth or joint line tenderness or patellar tenderness or condyle tenderness. ROM normal in flexion and extension and lower leg rotation. Ligaments with solid consistent endpoints including ACL, PCL, LCL, MCL. Negative Mcmurray's and provocative meniscal tests. Non painful patellar compression. Patellar and quadriceps tendons unremarkable. Hamstring and quadriceps strength is normal.  Procedure: Real-time Ultrasound Guided aspiration/Injection of right knee Device: GE Logiq E  Verbal informed  consent obtained.  Time-out conducted.  Noted no overlying erythema, induration, or other signs of local infection.  Skin prepped in a sterile fashion.  Local anesthesia: Topical Ethyl chloride.  With sterile technique and under real time ultrasound guidance:  Aspirated 15 mL of straw-colored fluid, syringe switched and 2 mL kenalog 40, 4 mL lidocaine injected easily. Completed without difficulty  Pain immediately resolved suggesting accurate placement of the medication.  Advised to call if fevers/chills, erythema, induration, drainage, or persistent bleeding.  Images permanently stored and available for review in the ultrasound unit.  Impression: Technically successful ultrasound guided injection.  Procedure: Real-time Ultrasound Guided Injection of left knee Device: GE Logiq E  Verbal informed consent obtained.  Time-out conducted.  Noted no overlying erythema, induration, or other signs of local infection.  Skin prepped in a sterile fashion.  Local anesthesia: Topical Ethyl chloride.  With sterile technique and under real time ultrasound guidance:  2 mL kenalog 40, 4 mL lidocaine injected easily. Completed without difficulty  Pain immediately resolved suggesting accurate placement of the medication.  Advised to call if fevers/chills, erythema, induration, drainage, or persistent bleeding.  Images permanently stored and available for review in the ultrasound unit.  Impression: Technically successful ultrasound guided injection.  Impression and Recommendations:

## 2014-07-17 NOTE — Assessment & Plan Note (Signed)
With bilateral knee effusions. Aspiration and injection as above. Fluid analysis. Return in one month, we will likely start allopurinol at that time, uric acid levels were greater than 8.

## 2014-07-18 LAB — SYNOVIAL CELL COUNT + DIFF, W/ CRYSTALS
Crystals, Fluid: NONE SEEN
Eosinophils-Synovial: 0 % (ref 0–1)
Lymphocytes-Synovial Fld: 72 % — ABNORMAL HIGH (ref 0–20)
Monocyte/Macrophage: 28 % — ABNORMAL LOW (ref 50–90)
Neutrophil, Synovial: 0 % (ref 0–25)
WBC, Synovial: 450 cu mm — ABNORMAL HIGH (ref 0–200)

## 2014-07-21 LAB — BODY FLUID CULTURE
Gram Stain: NONE SEEN
Gram Stain: NONE SEEN
Organism ID, Bacteria: NO GROWTH

## 2014-07-28 ENCOUNTER — Ambulatory Visit: Payer: Managed Care, Other (non HMO) | Admitting: Sports Medicine

## 2014-08-09 ENCOUNTER — Other Ambulatory Visit: Payer: Self-pay | Admitting: Physician Assistant

## 2014-08-13 ENCOUNTER — Encounter: Payer: Self-pay | Admitting: Sports Medicine

## 2014-08-13 ENCOUNTER — Ambulatory Visit (INDEPENDENT_AMBULATORY_CARE_PROVIDER_SITE_OTHER): Payer: Commercial Managed Care - PPO | Admitting: Sports Medicine

## 2014-08-13 VITALS — BP 114/78 | HR 65 | Wt 289.0 lb

## 2014-08-13 DIAGNOSIS — R208 Other disturbances of skin sensation: Secondary | ICD-10-CM

## 2014-08-13 DIAGNOSIS — M6588 Other synovitis and tenosynovitis, other site: Secondary | ICD-10-CM

## 2014-08-13 DIAGNOSIS — R200 Anesthesia of skin: Secondary | ICD-10-CM

## 2014-08-13 DIAGNOSIS — M109 Gout, unspecified: Secondary | ICD-10-CM

## 2014-08-13 DIAGNOSIS — M7750 Other enthesopathy of unspecified foot: Secondary | ICD-10-CM

## 2014-08-13 MED ORDER — ALLOPURINOL 300 MG PO TABS: 300.0000 mg | ORAL_TABLET | Freq: Every day | ORAL | Status: DC

## 2014-08-13 MED ORDER — GABAPENTIN 300 MG PO CAPS: ORAL_CAPSULE | ORAL | Status: DC

## 2014-08-13 NOTE — Progress Notes (Signed)
  Subjective:    CC: follow-up  HPI: Bilateral foot pain: Resolved after injections. And with custom orthotics.  Bilateral knee pain: Improved significantly with aspiration and injection. Right knee is starting to hurt a bit more.  Gout: This is the likely cause of his knee and foot pain in addition to some underlying degenerative changes. Uric acid levels were above 8, he has not yet started a uric acid lowering medication.  Right foot numbness: Moderate, persistent, with back pain and localized in an L4 distribution. Would like to try medication before pursuing MRI or intervention.  Past medical history, Surgical history, Family history not pertinant except as noted below, Social history, Allergies, and medications have been entered into the medical record, reviewed, and no changes needed.   Review of Systems: No fevers, chills, night sweats, weight loss, chest pain, or shortness of breath.   Objective:    General: Well Developed, well nourished, and in no acute distress.  Neuro: Alert and oriented x3, extra-ocular muscles intact, sensation grossly intact.  HEENT: Normocephalic, atraumatic, pupils equal round reactive to light, neck supple, no masses, no lymphadenopathy, thyroid nonpalpable.  Skin: Warm and dry, no rashes. Cardiac: Regular rate and rhythm, no murmurs rubs or gallops, no lower extremity edema.  Respiratory: Clear to auscultation bilaterally. Not using accessory muscles, speaking in full sentences. Right Knee: Normal to inspection with no erythema or effusion or obvious bony abnormalities. Palpation normal with no warmth or joint line tenderness or patellar tenderness or condyle tenderness. ROM normal in flexion and extension and lower leg rotation. Ligaments with solid consistent endpoints including ACL, PCL, LCL, MCL. Negative Mcmurray's and provocative meniscal tests. Non painful patellar compression. Patellar and quadriceps tendons unremarkable. Hamstring and  quadriceps strength is normal. Bilateral Foot: No visible erythema or swelling. There is mild visible fullness over the first tarsometatarsal joint bilaterally. Range of motion is full in all directions. Strength is 5/5 in all directions. No hallux valgus. No pes cavus or pes planus. No abnormal callus noted. No pain over the navicular prominence, or base of fifth metatarsal. No tenderness to palpation of the calcaneal insertion of plantar fascia. No pain at the Achilles insertion. No pain over the calcaneal bursa. No pain of the retrocalcaneal bursa. No tenderness to palpation over the tarsals, metatarsals, or phalanges. No hallux rigidus or limitus. No tenderness palpation over interphalangeal joints. No pain with compression of the metatarsal heads. Neurovascularly intact distally.  Impression and Recommendations:

## 2014-08-13 NOTE — Assessment & Plan Note (Signed)
Resolved with injections and orthotics.

## 2014-08-13 NOTE — Assessment & Plan Note (Signed)
Likely represents right L4 radiculopathy. Starting gabapentin. Return in one month, we will consider MRI for intervention at that point.

## 2014-08-13 NOTE — Assessment & Plan Note (Signed)
Flares in both knees has resolved. Starting allopurinol. Recheck uric acid levels in one month.

## 2014-08-25 ENCOUNTER — Telehealth: Payer: Self-pay

## 2014-08-25 ENCOUNTER — Other Ambulatory Visit: Payer: Self-pay | Admitting: Physician Assistant

## 2014-08-25 MED ORDER — HYDROXYZINE PAMOATE 50 MG PO CAPS: 50.0000 mg | ORAL_CAPSULE | Freq: Two times a day (BID) | ORAL | Status: DC

## 2014-08-25 NOTE — Telephone Encounter (Signed)
Sent!

## 2014-08-25 NOTE — Telephone Encounter (Signed)
Patient request refill for Vistaril. Rhonda Cunningham,CMA

## 2014-09-08 ENCOUNTER — Other Ambulatory Visit: Payer: Self-pay | Admitting: Physician Assistant

## 2014-09-09 ENCOUNTER — Ambulatory Visit: Payer: Managed Care, Other (non HMO) | Admitting: Sports Medicine

## 2014-09-09 ENCOUNTER — Ambulatory Visit (INDEPENDENT_AMBULATORY_CARE_PROVIDER_SITE_OTHER): Payer: Managed Care, Other (non HMO) | Admitting: Physician Assistant

## 2014-09-09 ENCOUNTER — Encounter: Payer: Self-pay | Admitting: Physician Assistant

## 2014-09-09 VITALS — BP 116/77 | HR 75 | Temp 98.1°F | Ht >= 80 in | Wt 296.0 lb

## 2014-09-09 DIAGNOSIS — J209 Acute bronchitis, unspecified: Secondary | ICD-10-CM

## 2014-09-09 MED ORDER — PREDNISONE 50 MG PO TABS: ORAL_TABLET | ORAL | Status: DC

## 2014-09-09 MED ORDER — HYDROCOD POLST-CHLORPHEN POLST 10-8 MG/5ML PO LQCR: 5.0000 mL | Freq: Two times a day (BID) | ORAL | Status: DC | PRN

## 2014-09-09 MED ORDER — DOXYCYCLINE HYCLATE 100 MG PO TABS: 100.0000 mg | ORAL_TABLET | Freq: Two times a day (BID) | ORAL | Status: DC

## 2014-09-09 NOTE — Progress Notes (Signed)
   Subjective:    Patient ID: Elijah Ford, male    DOB: September 04, 1976, 38 y.o.   MRN: [id number]  HPI Patient presents to the clinic with a cough, headache, fatigue for the last 3 days. He did run a low-grade temperature at start of symptoms. He has not ran temperatures since. His cough is extremely violent causes his whole body ache. He does feel short of breath and wheezing. He does have an inhaler and he is using infrequently with little to no benefit. No other sick contacts.   Review of Systems  All other systems reviewed and are negative.      Objective:   Physical Exam  Constitutional: He is oriented to person, place, and time. He appears well-developed and well-nourished.  HENT:  Head: Normocephalic and atraumatic.  Right Ear: External ear normal.  Left Ear: External ear normal.  Nose: Nose normal.  Mouth/Throat: Oropharynx is clear and moist.  Eyes:  Watery bilateral eyes with no injected conjunctiva  Neck: Normal range of motion. Neck supple.  Cardiovascular: Normal rate, regular rhythm and normal heart sounds.   Pulmonary/Chest:  Patient is unable to take deep breaths due to coughing violently. DuoNeb was given and lungs were auscultated. Bilateral wheezing, expiratory. No rhonchi or rales.  Lymphadenopathy:    He has no cervical adenopathy.  Neurological: He is alert and oriented to person, place, and time.  Skin: Skin is dry.  Psychiatric: He has a normal mood and affect. His behavior is normal.          Assessment & Plan:  Acute bronchitis/wheezing-DuoNeb given in office today with significant improvement. Solu Medrol 125mg  IM given in office today. Prednisone 50 mg for the next 5 days give to start tomorrow. Doxycycline twice daily for the next 10 days. Tussionex given for cough at bedtime. Written out a work for the next 2 days rest and hydration. Follow-up as needed. Patient has albuterol at home to continue to use every 2-4 hours as needed for SOB. If still not  get improvement call we can get set up with a nebulizer.

## 2014-09-09 NOTE — Patient Instructions (Signed)
Keep albuterol 2 puff every 2-4 hours.   Acute Bronchitis Bronchitis is inflammation of the airways that extend from the windpipe into the lungs (bronchi). The inflammation often causes mucus to develop. This leads to a cough, which is the most common symptom of bronchitis.  In acute bronchitis, the condition usually develops suddenly and goes away over time, usually in a couple weeks. Smoking, allergies, and asthma can make bronchitis worse. Repeated episodes of bronchitis may cause further lung problems.  CAUSES Acute bronchitis is most often caused by the same virus that causes a cold. The virus can spread from person to person (contagious) through coughing, sneezing, and touching contaminated objects. SIGNS AND SYMPTOMS   Cough.   Fever.   Coughing up mucus.   Body aches.   Chest congestion.   Chills.   Shortness of breath.   Sore throat.  DIAGNOSIS  Acute bronchitis is usually diagnosed through a physical exam. Your health care provider will also ask you questions about your medical history. Tests, such as chest X-rays, are sometimes done to rule out other conditions.  TREATMENT  Acute bronchitis usually goes away in a couple weeks. Oftentimes, no medical treatment is necessary. Medicines are sometimes given for relief of fever or cough. Antibiotic medicines are usually not needed but may be prescribed in certain situations. In some cases, an inhaler may be recommended to help reduce shortness of breath and control the cough. A cool mist vaporizer may also be used to help thin bronchial secretions and make it easier to clear the chest.  HOME CARE INSTRUCTIONS  Get plenty of rest.   Drink enough fluids to keep your urine clear or pale yellow (unless you have a medical condition that requires fluid restriction). Increasing fluids may help thin your respiratory secretions (sputum) and reduce chest congestion, and it will prevent dehydration.   Take medicines only as  directed by your health care provider.  If you were prescribed an antibiotic medicine, finish it all even if you start to feel better.  Avoid smoking and secondhand smoke. Exposure to cigarette smoke or irritating chemicals will make bronchitis worse. If you are a smoker, consider using nicotine gum or skin patches to help control withdrawal symptoms. Quitting smoking will help your lungs heal faster.   Reduce the chances of another bout of acute bronchitis by washing your hands frequently, avoiding people with cold symptoms, and trying not to touch your hands to your mouth, nose, or eyes.   Keep all follow-up visits as directed by your health care provider.  SEEK MEDICAL CARE IF: Your symptoms do not improve after 1 week of treatment.  SEEK IMMEDIATE MEDICAL CARE IF:  You develop an increased fever or chills.   You have chest pain.   You have severe shortness of breath.  You have bloody sputum.   You develop dehydration.  You faint or repeatedly feel like you are going to pass out.  You develop repeated vomiting.  You develop a severe headache. MAKE SURE YOU:   Understand these instructions.  Will watch your condition.  Will get help right away if you are not doing well or get worse. Document Released: 10/13/2004 Document Revised: 01/20/2014 Document Reviewed: 02/26/2013 [facility] Patient Information 2015 [place], [place]. This information is not intended to replace advice given to you by your health care provider. Make sure you discuss any questions you have with your health care provider.

## 2014-09-18 ENCOUNTER — Encounter (HOSPITAL_BASED_OUTPATIENT_CLINIC_OR_DEPARTMENT_OTHER): Payer: Self-pay | Admitting: *Deleted

## 2014-09-18 ENCOUNTER — Emergency Department (HOSPITAL_BASED_OUTPATIENT_CLINIC_OR_DEPARTMENT_OTHER): Payer: Managed Care, Other (non HMO)

## 2014-09-18 ENCOUNTER — Inpatient Hospital Stay (HOSPITAL_BASED_OUTPATIENT_CLINIC_OR_DEPARTMENT_OTHER)
Admission: EM | Admit: 2014-09-18 | Discharge: 2014-09-20 | DRG: 194 | Disposition: A | Discharge status: Home / Self Care | Payer: Managed Care, Other (non HMO) | Attending: Internal Medicine | Admitting: Internal Medicine

## 2014-09-18 DIAGNOSIS — I10 Essential (primary) hypertension: Secondary | ICD-10-CM | POA: Diagnosis present

## 2014-09-18 DIAGNOSIS — K219 Gastro-esophageal reflux disease without esophagitis: Secondary | ICD-10-CM

## 2014-09-18 DIAGNOSIS — M109 Gout, unspecified: Secondary | ICD-10-CM | POA: Diagnosis present

## 2014-09-18 DIAGNOSIS — R059 Cough, unspecified: Secondary | ICD-10-CM

## 2014-09-18 DIAGNOSIS — J45901 Unspecified asthma with (acute) exacerbation: Secondary | ICD-10-CM | POA: Diagnosis present

## 2014-09-18 DIAGNOSIS — Z6832 Body mass index (BMI) 32.0-32.9, adult: Secondary | ICD-10-CM | POA: Diagnosis not present

## 2014-09-18 DIAGNOSIS — R7309 Other abnormal glucose: Secondary | ICD-10-CM | POA: Diagnosis present

## 2014-09-18 DIAGNOSIS — R7303 Prediabetes: Secondary | ICD-10-CM | POA: Diagnosis present

## 2014-09-18 DIAGNOSIS — J189 Pneumonia, unspecified organism: Secondary | ICD-10-CM | POA: Diagnosis present

## 2014-09-18 DIAGNOSIS — R55 Syncope and collapse: Secondary | ICD-10-CM

## 2014-09-18 DIAGNOSIS — R42 Dizziness and giddiness: Secondary | ICD-10-CM | POA: Diagnosis not present

## 2014-09-18 DIAGNOSIS — E785 Hyperlipidemia, unspecified: Secondary | ICD-10-CM | POA: Diagnosis present

## 2014-09-18 DIAGNOSIS — E669 Obesity, unspecified: Secondary | ICD-10-CM | POA: Diagnosis present

## 2014-09-18 DIAGNOSIS — R05 Cough: Secondary | ICD-10-CM

## 2014-09-18 DIAGNOSIS — J1100 Influenza due to unidentified influenza virus with unspecified type of pneumonia: Secondary | ICD-10-CM | POA: Diagnosis not present

## 2014-09-18 LAB — CBC WITH DIFFERENTIAL/PLATELET
BASOS PCT: 2 % — AB (ref 0–1)
Basophils Absolute: 0.2 10*3/uL — ABNORMAL HIGH (ref 0.0–0.1)
EOS PCT: 5 % (ref 0–5)
Eosinophils Absolute: 0.4 10*3/uL (ref 0.0–0.7)
HCT: 46.3 % (ref 39.0–52.0)
HEMOGLOBIN: 16 g/dL (ref 13.0–17.0)
Lymphocytes Relative: 21 % (ref 12–46)
Lymphs Abs: 1.9 10*3/uL (ref 0.7–4.0)
MCH: 31.4 pg (ref 26.0–34.0)
MCHC: 34.6 g/dL (ref 30.0–36.0)
MCV: 91 fL (ref 78.0–100.0)
MONO ABS: 0.6 10*3/uL (ref 0.1–1.0)
MONOS PCT: 7 % (ref 3–12)
Metamyelocytes Relative: 1 %
NEUTROS PCT: 64 % (ref 43–77)
Neutro Abs: 5.8 10*3/uL (ref 1.7–7.7)
Platelets: 260 10*3/uL (ref 150–400)
RBC: 5.09 MIL/uL (ref 4.22–5.81)
RDW: 14.2 % (ref 11.5–15.5)
WBC: 8.9 10*3/uL (ref 4.0–10.5)

## 2014-09-18 LAB — COMPREHENSIVE METABOLIC PANEL
ALT: 29 U/L (ref 0–53)
AST: 22 U/L (ref 0–37)
Albumin: 4.2 g/dL (ref 3.5–5.2)
Alkaline Phosphatase: 54 U/L (ref 39–117)
Anion gap: 8 (ref 5–15)
BUN: 16 mg/dL (ref 6–23)
CHLORIDE: 102 meq/L (ref 96–112)
CO2: 29 mmol/L (ref 19–32)
CREATININE: 1.18 mg/dL (ref 0.50–1.35)
Calcium: 10.2 mg/dL (ref 8.4–10.5)
GFR calc Af Amer: 88 mL/min — ABNORMAL LOW (ref >90)
GFR calc non Af Amer: 76 mL/min — ABNORMAL LOW (ref >90)
Glucose, Bld: 98 mg/dL (ref 70–99)
POTASSIUM: 4.5 mmol/L (ref 3.5–5.1)
Sodium: 139 mmol/L (ref 135–145)
TOTAL PROTEIN: 7.2 g/dL (ref 6.0–8.3)
Total Bilirubin: 0.4 mg/dL (ref 0.3–1.2)

## 2014-09-18 LAB — CREATININE, SERUM
Creatinine, Ser: 1.15 mg/dL (ref 0.50–1.35)
GFR calc Af Amer: >90 mL/min (ref >90)
GFR calc non Af Amer: 79 mL/min — ABNORMAL LOW (ref >90)

## 2014-09-18 LAB — I-STAT CG4 LACTIC ACID, ED: LACTIC ACID, VENOUS: 1.43 mmol/L (ref 0.5–2.2)

## 2014-09-18 LAB — TROPONIN I

## 2014-09-18 LAB — STREP PNEUMONIAE URINARY ANTIGEN: STREP PNEUMO URINARY ANTIGEN: NEGATIVE

## 2014-09-18 MED ORDER — SODIUM CHLORIDE 0.9 % IV SOLN
1000.0000 mL | INTRAVENOUS | Status: DC
  Administered 2014-09-18 – 2014-09-19 (×2): 1000 mL via INTRAVENOUS

## 2014-09-18 MED ORDER — ALBUTEROL SULFATE HFA 108 (90 BASE) MCG/ACT IN AERS: 2.0000 | INHALATION_SPRAY | Freq: Four times a day (QID) | RESPIRATORY_TRACT | Status: DC | PRN

## 2014-09-18 MED ORDER — SODIUM CHLORIDE 0.9 % IV SOLN
1000.0000 mL | Freq: Once | INTRAVENOUS | Status: AC
  Administered 2014-09-18: 1000 mL via INTRAVENOUS

## 2014-09-18 MED ORDER — HYDROXYZINE HCL 50 MG PO TABS
50.0000 mg | ORAL_TABLET | Freq: Two times a day (BID) | ORAL | Status: DC
  Administered 2014-09-18 – 2014-09-20 (×4): 50 mg via ORAL
  Filled 2014-09-18 (×5): qty 1

## 2014-09-18 MED ORDER — ACETAMINOPHEN 500 MG PO TABS
1000.0000 mg | ORAL_TABLET | Freq: Once | ORAL | Status: AC
  Administered 2014-09-18: 1000 mg via ORAL
  Filled 2014-09-18: qty 2

## 2014-09-18 MED ORDER — ALBUTEROL SULFATE (2.5 MG/3ML) 0.083% IN NEBU: 2.5000 mg | INHALATION_SOLUTION | RESPIRATORY_TRACT | Status: DC | PRN

## 2014-09-18 MED ORDER — ACETAMINOPHEN 325 MG PO TABS
650.0000 mg | ORAL_TABLET | Freq: Once | ORAL | Status: DC
Start: 2014-09-18 — End: 2014-09-18

## 2014-09-18 MED ORDER — ENOXAPARIN SODIUM 40 MG/0.4ML ~~LOC~~ SOLN
40.0000 mg | SUBCUTANEOUS | Status: DC
  Administered 2014-09-18 – 2014-09-19 (×2): 40 mg via SUBCUTANEOUS
  Filled 2014-09-18 (×3): qty 0.4

## 2014-09-18 MED ORDER — GUAIFENESIN-DM 100-10 MG/5ML PO SYRP
10.0000 mL | ORAL_SOLUTION | ORAL | Status: DC | PRN
  Filled 2014-09-18: qty 10

## 2014-09-18 MED ORDER — SODIUM CHLORIDE 0.9 % IJ SOLN: 3.0000 mL | Freq: Two times a day (BID) | INTRAMUSCULAR | Status: DC

## 2014-09-18 MED ORDER — ALBUTEROL SULFATE (2.5 MG/3ML) 0.083% IN NEBU
2.5000 mg | INHALATION_SOLUTION | Freq: Four times a day (QID) | RESPIRATORY_TRACT | Status: DC
  Administered 2014-09-18 – 2014-09-20 (×8): 2.5 mg via RESPIRATORY_TRACT
  Filled 2014-09-18 (×8): qty 3

## 2014-09-18 MED ORDER — HYDROCODONE-ACETAMINOPHEN 5-325 MG PO TABS: 1.0000 | ORAL_TABLET | ORAL | Status: DC | PRN

## 2014-09-18 MED ORDER — CEFTRIAXONE SODIUM IN DEXTROSE 20 MG/ML IV SOLN
1.0000 g | INTRAVENOUS | Status: DC
  Administered 2014-09-19 – 2014-09-20 (×2): 1 g via INTRAVENOUS
  Filled 2014-09-18 (×2): qty 50

## 2014-09-18 MED ORDER — BUPROPION HCL ER (XL) 300 MG PO TB24
300.0000 mg | ORAL_TABLET | Freq: Every day | ORAL | Status: DC
Start: 2014-09-19 — End: 2014-09-20
  Administered 2014-09-19 – 2014-09-20 (×2): 300 mg via ORAL
  Filled 2014-09-18 (×2): qty 1

## 2014-09-18 MED ORDER — ACETAMINOPHEN 650 MG RE SUPP: 650.0000 mg | Freq: Four times a day (QID) | RECTAL | Status: DC | PRN

## 2014-09-18 MED ORDER — FLUOXETINE HCL 20 MG PO CAPS
40.0000 mg | ORAL_CAPSULE | Freq: Every day | ORAL | Status: DC
  Administered 2014-09-18 – 2014-09-19 (×2): 40 mg via ORAL
  Filled 2014-09-18 (×3): qty 2

## 2014-09-18 MED ORDER — GUAIFENESIN ER 600 MG PO TB12
1200.0000 mg | ORAL_TABLET | Freq: Two times a day (BID) | ORAL | Status: DC
  Administered 2014-09-18 – 2014-09-20 (×4): 1200 mg via ORAL
  Filled 2014-09-18 (×5): qty 2

## 2014-09-18 MED ORDER — DEXTROSE 5 % IV SOLN
500.0000 mg | INTRAVENOUS | Status: DC
  Administered 2014-09-18 – 2014-09-20 (×3): 500 mg via INTRAVENOUS
  Filled 2014-09-18 (×3): qty 500

## 2014-09-18 MED ORDER — ALLOPURINOL 300 MG PO TABS
300.0000 mg | ORAL_TABLET | Freq: Every day | ORAL | Status: DC
  Administered 2014-09-19 – 2014-09-20 (×2): 300 mg via ORAL
  Filled 2014-09-18 (×2): qty 1

## 2014-09-18 MED ORDER — VITAMIN D (ERGOCALCIFEROL) 1.25 MG (50000 UNIT) PO CAPS: 50000.0000 [IU] | ORAL_CAPSULE | ORAL | Status: DC

## 2014-09-18 MED ORDER — SODIUM CHLORIDE 0.9 % IV SOLN: 1000.0000 mL | INTRAVENOUS | Status: DC

## 2014-09-18 MED ORDER — ACETAMINOPHEN 325 MG PO TABS: 650.0000 mg | ORAL_TABLET | Freq: Four times a day (QID) | ORAL | Status: DC | PRN

## 2014-09-18 MED ORDER — MORPHINE SULFATE 2 MG/ML IJ SOLN: 1.0000 mg | INTRAMUSCULAR | Status: DC | PRN

## 2014-09-18 MED ORDER — GUAIFENESIN-CODEINE 100-10 MG/5ML PO SOLN
5.0000 mL | ORAL | Status: DC | PRN
  Administered 2014-09-19: 5 mL via ORAL
  Filled 2014-09-18: qty 5

## 2014-09-18 MED ORDER — PANTOPRAZOLE SODIUM 40 MG PO TBEC
40.0000 mg | DELAYED_RELEASE_TABLET | Freq: Every day | ORAL | Status: DC
  Administered 2014-09-19 – 2014-09-20 (×2): 40 mg via ORAL
  Filled 2014-09-18 (×2): qty 1

## 2014-09-18 MED ORDER — CEFTRIAXONE SODIUM 1 G IJ SOLR
INTRAMUSCULAR | Status: AC
  Filled 2014-09-18: qty 10

## 2014-09-18 MED ORDER — DEXTROSE 5 % IV SOLN
1.0000 g | Freq: Once | INTRAVENOUS | Status: AC
  Administered 2014-09-18: 1 g via INTRAVENOUS

## 2014-09-18 MED ORDER — SODIUM CHLORIDE 0.9 % IV SOLN: 250.0000 mL | INTRAVENOUS | Status: DC | PRN

## 2014-09-18 MED ORDER — DEXTROSE 5 % IV SOLN: 500.0000 mg | INTRAVENOUS | Status: DC

## 2014-09-18 MED ORDER — SODIUM CHLORIDE 0.9 % IJ SOLN: 3.0000 mL | INTRAMUSCULAR | Status: DC | PRN

## 2014-09-18 MED ORDER — GABAPENTIN 400 MG PO CAPS
1500.0000 mg | ORAL_CAPSULE | Freq: Every day | ORAL | Status: DC
  Administered 2014-09-18 – 2014-09-19 (×2): 1500 mg via ORAL
  Filled 2014-09-18 (×3): qty 1

## 2014-09-18 MED ORDER — METOPROLOL SUCCINATE ER 50 MG PO TB24
50.0000 mg | ORAL_TABLET | Freq: Every day | ORAL | Status: DC
  Administered 2014-09-18 – 2014-09-19 (×2): 50 mg via ORAL
  Filled 2014-09-18 (×3): qty 1

## 2014-09-18 NOTE — Progress Notes (Signed)
Elijah Ford [id number] Code Status: Full   Admission Data: 09/18/2014 6:49 PM Attending Provider:  Singh  SHF:WYOVZCHY, JADE, PA-C Consults/ Treatment Team:    Mendell Bontempo is a 38 y.o. male patient admitted from ED awake, alert - oriented  X 3 - no acute distress noted.  VSS - Blood pressure 135/98, pulse 65, temperature 98 F (36.7 C), temperature source Oral, resp. rate 18, height 6\' 8"  (2.032 m), weight 134.265 kg (296 lb), SpO2 98 %.    IV in place, occlusive dsg intact without redness.  Orientation to room, and floor completed with information packet given to patient/family. Admission INP armband ID verified with patient/family, and in place.   SR up x 2, fall assessment complete, with patient and family able to verbalize understanding of risk associated with falls, and verbalized understanding to call nsg before up out of bed.  Call light within reach, patient able to voice, and demonstrate understanding.     Will cont to eval and treat per MD orders.  Henriette Combs, [place] 09/18/2014 6:49 PM

## 2014-09-18 NOTE — H&P (Addendum)
[facility]                                                                                    Patient Demographics  Elijah Ford, is a 38 y.o. male  CSN: [id number]  MRN: [id number]  DOB - 31-Mar-1976  Admit Date - 09/18/2014  Outpatient Primary MD for the patient is Iran Planas, PA-C   With History of -  Past Medical History  Diagnosis Date  . Hypertension   . Hyperlipidemia       Past Surgical History  Procedure Laterality Date  . Uvula removal    . Tonsilectomy/adenoidectomy with myringotomy      in for   Chief Complaint  Patient presents with  . Influenza     HPI  Elijah Ford  is a 38 y.o. male, with past medical history significant for hypertension and hyperlipidemia presenting with 3 weeks history of cough and shortness of breath . Patient was started on doxycycline however no improvement No nausea or vomiting. The patient had a near syncopal episode today when coughing however he did not lose consciousness, but He reports losing peripheral vision . Patient reports previous bronchitis episodes. No history of drug abuse    Review of Systems    In addition to the HPI above,  No Fever-chills, No Headache, No changes with Vision or hearing, No problems swallowing food or Liquids, No Chest pain No Abdominal pain, No Nausea or Vommitting, Bowel movements are regular, No Blood in stool or Urine, No dysuria, No new skin rashes or bruises, No new joints pains-aches,  No new weakness, tingling, numbness in any extremity, No recent weight gain or loss, No polyuria, polydypsia or polyphagia, No significant Mental Stressors.  A full 10 point Review of Systems was done, except as stated above, all other Review of Systems were negative.   Social History History  Substance Use Topics  . Smoking status: Never Smoker   . Smokeless tobacco: Not on file  . Alcohol Use: No     Family History   Prior to Admission medications   Medication  Sig Start Date End Date Taking? Authorizing Provider  albuterol (PROVENTIL HFA;VENTOLIN HFA) 108 (90 BASE) MCG/ACT inhaler Inhale 2 puffs into the lungs every 6 (six) hours as needed for wheezing or shortness of breath. 12/18/13  Yes Jade L Breeback, PA-C  allopurinol (ZYLOPRIM) 300 MG tablet Take 1 tablet (300 mg total) by mouth daily. 08/13/14  Yes Silverio Decamp, MD  buPROPion (WELLBUTRIN XL) 300 MG 24 hr tablet Take 1 tablet (300 mg total) by mouth daily. 06/18/14  Yes Jade L Breeback, PA-C  chlorpheniramine-HYDROcodone (TUSSIONEX) 10-8 MG/5ML LQCR Take 5 mLs by mouth every 12 (twelve) hours as needed for cough (cough, will cause drowsiness.). 09/09/14  Yes Jade L Breeback, PA-C  Dextromethorphan-Guaifenesin (MUCINEX DM MAXIMUM STRENGTH PO) Take 1 tablet by mouth daily as needed (cold symptoms).   Yes Historical Provider, MD  doxycycline (VIBRA-TABS) 100 MG tablet Take 1 tablet (100 mg total) by mouth 2 (two) times daily. 09/09/14  Yes Jade L Breeback, PA-C  FLUoxetine (PROZAC) 40 MG capsule Take 1 capsule (40 mg total) by mouth daily.  06/18/14  Yes Jade L Breeback, PA-C  gabapentin (NEURONTIN) 300 MG capsule One tab PO qHS for a week, then BID for a week, then TID. May double weekly to a max of 3,600mg /day 08/13/14  Yes Silverio Decamp, MD  hydrOXYzine (VISTARIL) 50 MG capsule Take 1 capsule (50 mg total) by mouth 2 (two) times daily. Patient taking differently: Take 50 mg by mouth daily as needed for anxiety.  08/25/14  Yes Jade L Breeback, PA-C  metoprolol succinate (TOPROL-XL) 50 MG 24 hr tablet Take 1 tablet (50 mg total) by mouth daily. 09/08/14  Yes Jade L Breeback, PA-C  omeprazole (PRILOSEC) 40 MG capsule Take 1 capsule (40 mg total) by mouth daily. 06/04/14  Yes Jade L Breeback, PA-C  terbinafine (LAMISIL) 250 MG tablet Take 1 tablet (250 mg total) by mouth daily. 07/08/14 09/30/14 Yes Silverio Decamp, MD  ibuprofen (ADVIL,MOTRIN) 800 MG tablet Take 1 tablet (800 mg total) by  mouth every 8 (eight) hours as needed. Patient not taking: Reported on 09/18/2014 02/03/14   Royetta Car Breeback, PA-C  predniSONE (DELTASONE) 50 MG tablet Take one tablet for 5 days. Patient not taking: Reported on 09/18/2014 09/09/14   Donella Stade, PA-C  Vitamin D, Ergocalciferol, (DRISDOL) 50000 UNITS CAPS capsule Take 1 capsule (50,000 Units total) by mouth every 7 (seven) days. Patient not taking: Reported on 09/18/2014 07/09/14   Silverio Decamp, MD    Allergies  Allergen Reactions  . Other Itching    melons    Physical Exam  Vitals  Blood pressure 142/116, pulse 66, temperature 98.1 F (36.7 C), temperature source Oral, resp. rate 18, height 6\' 8"  (2.032 m), weight 133.1 kg (293 lb 6.9 oz), SpO2 100 %.   1. General healthy young male very pleasant in no acute distress  2. Normal affect and insight, Not Suicidal or Homicidal, Awake Alert, Oriented X 3.  3. No F.N deficits, grossly, ALL C.Nerves Intact,  4. Ears and Eyes appear Normal, Conjunctivae clear, PERRLA. Moist Oral Mucosa.  5. Supple Neck, No JVD, No cervical lymphadenopathy appriciated, No Carotid Bruits.  6. Symmetrical Chest wall movement, scattered rhonchi and wheezing.  7. RRR, No Gallops, Rubs or Murmurs, No Parasternal Heave.  8. Positive Bowel Sounds, Abdomen Soft, Non tender, No organomegaly appriciated,No rebound -guarding or rigidity.  9.  No Cyanosis, Normal Skin Turgor, No Skin Rash or Bruise.  10. Good muscle tone,  joints appear normal , no effusions, Normal ROM.  11. No Palpable Lymph Nodes in Neck or Axillae    Data Review  CBC  Recent Labs Lab 09/18/14 1200  WBC 8.9  HGB 16.0  HCT 46.3  PLT 260  MCV 91.0  MCH 31.4  MCHC 34.6  RDW 14.2  LYMPHSABS 1.9  MONOABS 0.6  EOSABS 0.4  BASOSABS 0.2*   ------------------------------------------------------------------------------------------------------------------  Chemistries   Recent Labs Lab 09/18/14 1200  NA 139   K 4.5  CL 102  CO2 29  GLUCOSE 98  BUN 16  CREATININE 1.18  CALCIUM 10.2  AST 22  ALT 29  ALKPHOS 54  BILITOT 0.4   ------------------------------------------------------------------------------------------------------------------ estimated creatinine clearance is 131.7 mL/min (by C-G formula based on Cr of 1.18). ------------------------------------------------------------------------------------------------------------------ No results for input(s): TSH, T4TOTAL, T3FREE, THYROIDAB in the last 72 hours.  Invalid input(s): FREET3   Coagulation profile No results for input(s): INR, PROTIME in the last 168 hours. ------------------------------------------------------------------------------------------------------------------- No results for input(s): DDIMER in the last 72 hours. -------------------------------------------------------------------------------------------------------------------  Cardiac Enzymes  Recent Labs  Lab 09/18/14 1616  TROPONINI <0.03   ------------------------------------------------------------------------------------------------------------------ Invalid input(s): POCBNP   ---------------------------------------------------------------------------------------------------------------  Urinalysis No results found for: COLORURINE, APPEARANCEUR, LABSPEC, PHURINE, GLUCOSEU, HGBUR, BILIRUBINUR, KETONESUR, PROTEINUR, UROBILINOGEN, NITRITE, LEUKOCYTESUR  ----------------------------------------------------------------------------------------------------------------   Imaging results:   Dg Chest 2 View  09/18/2014   CLINICAL DATA:  Productive cough with congestion for 3 weeks.  EXAM: CHEST  2 VIEW  COMPARISON:  06/27/2014.  FINDINGS: Trachea is midline. Heart size normal. There is new airspace disease projecting over the heart on the lateral view, possibly within the lingula on the frontal view. Lungs are low in volume but otherwise clear. No pleural  fluid.  IMPRESSION: Airspace opacification projecting over the heart on the lateral view may be within the lingula on the frontal view and is worrisome for pneumonia. Followup to clearing is recommended to exclude a centrally obstructing lesion.   Electronically Signed   By: Lorin Picket M.D.   On: 09/18/2014 12:30    My personal review of EKG: Rhythm NSR, at 58 bpm no acute changes    Assessment & Plan   1. Community-acquired pneumonia with dry cough . 2. History of asthma as a child 3. Presyncopal episode probably vasovagal 4. History of hypertension 5. Obesity 6. History of hyperlipidemia and prediabetes  Plan  IV Rocephin and Zithromax Nebulizer treatments  Robitussin-AC for the continuous cough Check sputum Gram stain culture and sensitivity Serial troponins Check echocardiogram     DVT ProphylaxisLovenox  AM Labs Ordered, also please review Full Orders  Family Communication: Admission, patients condition and plan of care including tests being ordered have been discussed with the patient and wife  who indicate understanding and agree with the plan and Code Status.  Code Status full  Disposition Plan: Home  Time spent in minutes : 34 minutes  Condition GUARDED   @SIGNATURE @

## 2014-09-18 NOTE — Progress Notes (Signed)
Placed patient on CPAP auto 5-20cmH20, 21%.  Patient is tolerating well at this time.

## 2014-09-18 NOTE — ED Notes (Signed)
Pt placed on cardiac monitor 

## 2014-09-18 NOTE — ED Notes (Signed)
He is taking an antibiotic for bronchitis that he started over a week ago. At work today he had an episode of lightheadedness, slurred speech and weakness. He is diaphoretic on arrival. Speech is clear.

## 2014-09-18 NOTE — ED Provider Notes (Signed)
CSN: [id number]     Arrival date & time 09/18/14  1121 History   None    Chief Complaint  Patient presents with  . Influenza     (Consider location/radiation/quality/duration/timing/severity/associated sxs/prior Treatment) Patient is a 38 y.o. male presenting with cough. The history is provided by the patient. No language interpreter was used.  Cough Cough characteristics:  Productive Sputum characteristics:  Nondescript Severity:  Moderate Onset quality:  Gradual Duration:  1 week Timing:  Constant Progression:  Worsening Chronicity:  New Smoker: no   Relieved by:  Nothing Worsened by:  Nothing tried Ineffective treatments:  None tried Associated symptoms: fever and shortness of breath   Associated symptoms: no chest pain    Pt has been on antibiotics for 9 days.   Pt reports near syncopal episode at work today.   Pt reports he felt dizzy and everything began looking dark like he was passing out.  Pt reports persistent cough.    Past Medical History  Diagnosis Date  . Hypertension   . Hyperlipidemia    Past Surgical History  Procedure Laterality Date  . Uvula removal    . Tonsilectomy/adenoidectomy with myringotomy     Family History  Problem Relation Age of Onset  . Depression Mother   . Cancer Father   . Alcohol abuse Brother   . Depression Brother   . Alcohol abuse Brother   . Depression Brother    History  Substance Use Topics  . Smoking status: Never Smoker   . Smokeless tobacco: Not on file  . Alcohol Use: No    Review of Systems  Constitutional: Positive for fever.  Respiratory: Positive for cough and shortness of breath.   Cardiovascular: Negative for chest pain.  All other systems reviewed and are negative.     Allergies  Review of patient's allergies indicates no known allergies.  Home Medications   Prior to Admission medications   Medication Sig Start Date End Date Taking? Authorizing Provider  albuterol (PROVENTIL HFA;VENTOLIN HFA)  108 (90 BASE) MCG/ACT inhaler Inhale 2 puffs into the lungs every 6 (six) hours as needed for wheezing or shortness of breath. 12/18/13   Jade L Breeback, PA-C  allopurinol (ZYLOPRIM) 300 MG tablet Take 1 tablet (300 mg total) by mouth daily. 08/13/14   Silverio Decamp, MD  buPROPion (WELLBUTRIN XL) 300 MG 24 hr tablet Take 1 tablet (300 mg total) by mouth daily. 06/18/14   Jade L Breeback, PA-C  chlorpheniramine-HYDROcodone (TUSSIONEX) 10-8 MG/5ML LQCR Take 5 mLs by mouth every 12 (twelve) hours as needed for cough (cough, will cause drowsiness.). 09/09/14   Jade L Breeback, PA-C  doxycycline (VIBRA-TABS) 100 MG tablet Take 1 tablet (100 mg total) by mouth 2 (two) times daily. 09/09/14   Jade L Breeback, PA-C  FLUoxetine (PROZAC) 40 MG capsule Take 1 capsule (40 mg total) by mouth daily. 06/18/14   Jade L Breeback, PA-C  gabapentin (NEURONTIN) 300 MG capsule One tab PO qHS for a week, then BID for a week, then TID. May double weekly to a max of 3,600mg /day 08/13/14   Silverio Decamp, MD  hydrOXYzine (VISTARIL) 50 MG capsule Take 1 capsule (50 mg total) by mouth 2 (two) times daily. 08/25/14   Jade L Breeback, PA-C  ibuprofen (ADVIL,MOTRIN) 800 MG tablet Take 1 tablet (800 mg total) by mouth every 8 (eight) hours as needed. 02/03/14   Jade L Breeback, PA-C  metoprolol succinate (TOPROL-XL) 50 MG 24 hr tablet Take 1 tablet (50 mg  total) by mouth daily. 09/08/14   Jade L Breeback, PA-C  omeprazole (PRILOSEC) 40 MG capsule Take 1 capsule (40 mg total) by mouth daily. 06/04/14   Jade L Breeback, PA-C  predniSONE (DELTASONE) 50 MG tablet Take one tablet for 5 days. 09/09/14   Jade L Breeback, PA-C  terbinafine (LAMISIL) 250 MG tablet Take 1 tablet (250 mg total) by mouth daily. 07/08/14 09/30/14  Silverio Decamp, MD  Vitamin D, Ergocalciferol, (DRISDOL) 50000 UNITS CAPS capsule Take 1 capsule (50,000 Units total) by mouth every 7 (seven) days. 07/09/14   Silverio Decamp, MD   BP 122/71 mmHg   Pulse 68  Temp(Src) 98.3 F (36.8 C) (Oral)  Resp 22  Ht 6\' 8"  (2.032 m)  Wt 296 lb (134.265 kg)  BMI 32.52 kg/m2  SpO2 98% Physical Exam  Constitutional: He is oriented to person, place, and time. He appears well-developed and well-nourished.  sweaty  HENT:  Head: Normocephalic and atraumatic.  Right Ear: External ear normal.  Left Ear: External ear normal.  Nose: Nose normal.  Mouth/Throat: Oropharynx is clear and moist.  Eyes: Conjunctivae and EOM are normal.  Neck: Normal range of motion.  Cardiovascular: Normal rate and normal heart sounds.   Pulmonary/Chest: Effort normal.  Rhonchi all lobes,    Abdominal: Soft. He exhibits no distension.  Musculoskeletal: Normal range of motion.  Neurological: He is alert and oriented to person, place, and time.  Skin: Skin is warm.  Psychiatric: He has a normal mood and affect.  Nursing note and vitals reviewed.   ED Course  Procedures (including critical care time) Labs Review Labs Reviewed - No data to display  Imaging Review No results found.   EKG Interpretation None      MDM  Pt given Iv fluids x 2 liters,  zithromax and Rocephin,   Pt able to tolerate po fluids.  02 sats 93 % at rest, improves with coughing and deep breath.   Pt able to ambulate after fluids, decreased dizziness.     Final diagnoses:  Community acquired pneumonia  Near syncope  Dizziness    I spoke with Dr. Candiss Norse.  He request troponin orthostatics and hiv testing.   Orders added.      Burke, PA-C 09/18/14 [place], MD 09/18/14 [phone]

## 2014-09-18 NOTE — Progress Notes (Signed)
MD has been paged. Currently awaiting orders.

## 2014-09-18 NOTE — ED Notes (Signed)
Pt ambulated around the dept, o2 sats remained 98-99% during ambulation.

## 2014-09-18 NOTE — ED Notes (Signed)
Pt requested med for h/a. K. Sofia PA aware.

## 2014-09-18 NOTE — ED Notes (Signed)
Report called to Cindy RN on 5W.  

## 2014-09-19 LAB — CBC
HEMATOCRIT: 45.5 % (ref 39.0–52.0)
Hemoglobin: 15.6 g/dL (ref 13.0–17.0)
MCH: 32.2 pg (ref 26.0–34.0)
MCHC: 34.3 g/dL (ref 30.0–36.0)
MCV: 94 fL (ref 78.0–100.0)
Platelets: 234 10*3/uL (ref 150–400)
RBC: 4.84 MIL/uL (ref 4.22–5.81)
RDW: 14.1 % (ref 11.5–15.5)
WBC: 7.2 10*3/uL (ref 4.0–10.5)

## 2014-09-19 MED ORDER — METHYLPREDNISOLONE SODIUM SUCC 125 MG IJ SOLR
60.0000 mg | Freq: Two times a day (BID) | INTRAMUSCULAR | Status: DC
  Administered 2014-09-19 – 2014-09-20 (×2): 60 mg via INTRAVENOUS
  Filled 2014-09-19 (×2): qty 2
  Filled 2014-09-19 (×2): qty 0.96

## 2014-09-19 MED ORDER — SODIUM CHLORIDE 0.9 % IV SOLN: INTRAVENOUS | Status: DC

## 2014-09-19 NOTE — Progress Notes (Signed)
[place] TEAM 1 - Stepdown/ICU TEAM Progress Note  Kahil Agner KXF:[id number] DOB: 1975/12/27 DOA: 09/18/2014 PCP: Iran Planas, PA-C  Admit HPI / Brief Narrative: 39 yo male with history significant for hypertension and hyperlipidemia who presented with 3 week history of cough and shortness of breath . Patient was started on doxycycline however experienced no improvement.  The patient had a near syncopal episode the day of admission when coughing (he did not lose consciousness, but lost peripheral vision). Patient reported previous bronchitis episodes.   HPI/Subjective: Pt states he does not yet feel significantly better.  He is wheezing today, which he states he has been doing lately, but has not otherwise been a problem for him "since he was a kid."  He denies n/v, abdom pain, or CP.    Assessment/Plan:  LUL (lingula) Community-acquired pneumonia  Cont empiric abx tx and follow clinically  Bronchospasm / acute asthma exacerbation Pt confirms he does not nor has he ever smoked - did have "childhood asthma" - is wheezing presently - will initiate neb tx and steroids - will need to leave on maintenance and rescue MDIs at time of d/c - suggest outpt PFTs once has returned to his baseline   Presyncopal episode probably vasovagal related to heavy hacking cough - EKG sinus brady at 58 but otherwise unrevealing w/ normal QTc  Hypertension BP currently well controlled  Obesity - Body mass index is 32.24 kg/(m^2).  Hyperlipidemia LDL 128 June 2015 - recheck  Prediabetes A1c pending   Gout Well controlled at present   Code Status: FULL Family Communication: no family present at time of exam - spoke w/ wife over phone w/ pt permission  Disposition Plan: med bed   Consultants: none  Procedures: none  Antibiotics: Azithro 12/31 > Rocephin 12/31 >  DVT prophylaxis: lovenox  Objective: Blood pressure 124/78, pulse 72, temperature 98.4 F (36.9 C), temperature source  Oral, resp. rate 18, height 6\' 8"  (2.032 m), weight 133.1 kg (293 lb 6.9 oz), SpO2 97 %.  Intake/Output Summary (Last 24 hours) at 09/19/14 1145 Last data filed at 09/19/14 1014  Gross per 24 hour  Intake    360 ml  Output    350 ml  Net     10 ml   Exam: General: No acute respiratory distress laying in bed  Lungs: no focal crackles appreciated - diffuse exp wheeze - able to complete full sentences w/o any distress Cardiovascular: Regular rate and rhythm without murmur gallop or rub normal S1 and S2 Abdomen: Nontender, nondistended, soft, bowel sounds positive, no rebound, no ascites, no appreciable mass Extremities: No significant cyanosis, clubbing, or edema bilateral lower extremities  Data Reviewed: Basic Metabolic Panel:  Recent Labs Lab 09/18/14 1200 09/18/14 2035  NA 139  --   K 4.5  --   CL 102  --   CO2 29  --   GLUCOSE 98  --   BUN 16  --   CREATININE 1.18 1.15  CALCIUM 10.2  --    Liver Function Tests:  Recent Labs Lab 09/18/14 1200  AST 22  ALT 29  ALKPHOS 54  BILITOT 0.4  PROT 7.2  ALBUMIN 4.2   CBC:  Recent Labs Lab 09/18/14 1200 09/19/14 0515  WBC 8.9 7.2  NEUTROABS 5.8  --   HGB 16.0 15.6  HCT 46.3 45.5  MCV 91.0 94.0  PLT 260 234   Cardiac Enzymes:  Recent Labs Lab 09/18/14 1616  TROPONINI <0.03   Studies:  Recent x-ray  studies have been reviewed in detail by the Attending Physician  Scheduled Meds:  Scheduled Meds: . albuterol  2.5 mg Nebulization Q6H  . allopurinol  300 mg Oral Daily  . azithromycin  500 mg Intravenous Q24H  . buPROPion  300 mg Oral Daily  . cefTRIAXone (ROCEPHIN)  IV  1 g Intravenous Q24H  . enoxaparin (LOVENOX) injection  40 mg Subcutaneous Q24H  . FLUoxetine  40 mg Oral QHS  . gabapentin  1,500 mg Oral QHS  . guaiFENesin  1,200 mg Oral BID  . hydrOXYzine  50 mg Oral BID  . metoprolol succinate  50 mg Oral QHS  . pantoprazole  40 mg Oral Daily  . sodium chloride  3 mL Intravenous Q12H  . sodium  chloride  3 mL Intravenous Q12H    Time spent on care of this patient: 35 mins   Sergio Zawislak T , MD   [facility] Office  [phone] Pager - Text Page per Shea Evans as per below:  On-Call/Text Page:      [email or web address]      password [email or web address]  If 7PM-7AM, please contact night-coverage [email or web address] Password [email or web address] 09/19/2014, 11:45 AM   LOS: 1 day

## 2014-09-19 NOTE — Progress Notes (Addendum)
Pt stated that he didn't need any assistance with CPAP machine tonight and that he would self administer when ready. Current CPAP settings are Auto CPAP 5-20 CMh20. Pt notified to call if any assistance is required, RT to monitor and assess as needed.

## 2014-09-20 LAB — COMPREHENSIVE METABOLIC PANEL
ALT: 24 U/L (ref 0–53)
ANION GAP: 10 (ref 5–15)
AST: 29 U/L (ref 0–37)
Albumin: 3.9 g/dL (ref 3.5–5.2)
Alkaline Phosphatase: 56 U/L (ref 39–117)
BUN: 17 mg/dL (ref 6–23)
CHLORIDE: 103 meq/L (ref 96–112)
CO2: 23 mmol/L (ref 19–32)
CREATININE: 1.4 mg/dL — AB (ref 0.50–1.35)
Calcium: 9.4 mg/dL (ref 8.4–10.5)
GFR calc Af Amer: 72 mL/min — ABNORMAL LOW (ref >90)
GFR, EST NON AFRICAN AMERICAN: 62 mL/min — AB (ref >90)
GLUCOSE: 224 mg/dL — AB (ref 70–99)
Potassium: 4.9 mmol/L (ref 3.5–5.1)
SODIUM: 136 mmol/L (ref 135–145)
Total Bilirubin: 0.7 mg/dL (ref 0.3–1.2)
Total Protein: 6 g/dL (ref 6.0–8.3)

## 2014-09-20 LAB — CBC
HEMATOCRIT: 46.7 % (ref 39.0–52.0)
Hemoglobin: 16.1 g/dL (ref 13.0–17.0)
MCH: 32.1 pg (ref 26.0–34.0)
MCHC: 34.5 g/dL (ref 30.0–36.0)
MCV: 93.2 fL (ref 78.0–100.0)
Platelets: 242 10*3/uL (ref 150–400)
RBC: 5.01 MIL/uL (ref 4.22–5.81)
RDW: 13.8 % (ref 11.5–15.5)
WBC: 10.1 10*3/uL (ref 4.0–10.5)

## 2014-09-20 LAB — LIPID PANEL
CHOL/HDL RATIO: 7.4 ratio
Cholesterol: 275 mg/dL — ABNORMAL HIGH (ref 0–200)
HDL: 37 mg/dL — AB (ref >39)
LDL CALC: UNDETERMINED mg/dL (ref 0–99)
TRIGLYCERIDES: 729 mg/dL — AB (ref <150)
VLDL: UNDETERMINED mg/dL (ref 0–40)

## 2014-09-20 LAB — BASIC METABOLIC PANEL
ANION GAP: 11 (ref 5–15)
BUN: 15 mg/dL (ref 6–23)
CALCIUM: 9.5 mg/dL (ref 8.4–10.5)
CHLORIDE: 103 meq/L (ref 96–112)
CO2: 23 mmol/L (ref 19–32)
CREATININE: 1.17 mg/dL (ref 0.50–1.35)
GFR, EST AFRICAN AMERICAN: 89 mL/min — AB (ref >90)
GFR, EST NON AFRICAN AMERICAN: 77 mL/min — AB (ref >90)
GLUCOSE: 271 mg/dL — AB (ref 70–99)
POTASSIUM: 4.6 mmol/L (ref 3.5–5.1)
Sodium: 137 mmol/L (ref 135–145)

## 2014-09-20 MED ORDER — LEVOFLOXACIN 750 MG PO TABS: 750.0000 mg | ORAL_TABLET | Freq: Every day | ORAL | Status: DC

## 2014-09-20 MED ORDER — SODIUM CHLORIDE 0.9 % IV SOLN
INTRAVENOUS | Status: AC
  Administered 2014-09-20: 12:00:00 via INTRAVENOUS

## 2014-09-20 MED ORDER — METHYLPREDNISOLONE (PAK) 4 MG PO TABS: ORAL_TABLET | ORAL | Status: DC

## 2014-09-20 MED ORDER — PREDNISONE 20 MG PO TABS
40.0000 mg | ORAL_TABLET | Freq: Every day | ORAL | Status: DC
Start: 2014-09-20 — End: 2014-09-20
  Administered 2014-09-20: 40 mg via ORAL
  Filled 2014-09-20 (×2): qty 2

## 2014-09-20 NOTE — Progress Notes (Signed)
Pt. Received discharge instructions and prescriptions. Educated pt. On follow-up appointments. Removed IV. No skin issues noted. All questions answered. No further needs noted at this time.

## 2014-09-20 NOTE — Discharge Instructions (Signed)
Follow with Primary MD BREEBACK, JADE, PA-C in 7 days   Get CBC, CMP, 2 view Chest X ray checked  by Primary MD next visit.    Activity: As tolerated with Full fall precautions   Disposition Home   Diet: Regular diet , with feeding assistance and aspiration precautions as needed.    On your next visit with your primary care physician please Get Medicines reviewed and adjusted.   Please request your Prim.MD to go over all Hospital Tests and Procedure/Radiological results at the follow up, please get all Hospital records sent to your Prim MD by signing hospital release before you go home.   If you experience worsening of your admission symptoms, develop shortness of breath, life threatening emergency, suicidal or homicidal thoughts you must seek medical attention immediately by calling 911 or calling your MD immediately  if symptoms less severe.  You Must read complete instructions/literature along with all the possible adverse reactions/side effects for all the Medicines you take and that have been prescribed to you. Take any new Medicines after you have completely understood and accpet all the possible adverse reactions/side effects.   Do not drive, operating heavy machinery, perform activities at heights, swimming or participation in water activities or provide baby sitting services if your were admitted for syncope or siezures until you have seen by Primary MD or a Neurologist and advised to do so again.  Do not drive when taking Pain medications.    Do not take more than prescribed Pain, Sleep and Anxiety Medications  Special Instructions: If you have smoked or chewed Tobacco  in the last 2 yrs please stop smoking, stop any regular Alcohol  and or any Recreational drug use.  Wear Seat belts while driving.   Please note  You were cared for by a hospitalist during your hospital stay. If you have any questions about your discharge medications or the care you received while you  were in the hospital after you are discharged, you can call the unit and asked to speak with the hospitalist on call if the hospitalist that took care of you is not available. Once you are discharged, your primary care physician will handle any further medical issues. Please note that NO REFILLS for any discharge medications will be authorized once you are discharged, as it is imperative that you return to your primary care physician (or establish a relationship with a primary care physician if you do not have one) for your aftercare needs so that they can reassess your need for medications and monitor your lab values.

## 2014-09-20 NOTE — Discharge Summary (Signed)
Elijah Ford, 39 y.o., DOB [birth year], MRN [id number]. Admission date: 09/18/2014 Discharge Date 09/20/2014 Primary MD Iran Planas, PA-C Admitting Physician Thurnell Lose, MD  Admission Diagnosis  Dizziness [R42] Coughing [R05] Community acquired pneumonia [J18.9] Near syncope [R55]  PCP please follow: - CBC, BMP, chest x-ray during next visit.  Discharge Diagnosis   Principal Problem:   CAP (community acquired pneumonia) Active Problems:   Essential hypertension, benign   GERD (gastroesophageal reflux disease)   Prediabetes   Gout   Pneumonia      Past Medical History  Diagnosis Date  . Hypertension   . Hyperlipidemia     Past Surgical History  Procedure Laterality Date  . Uvula removal    . Tonsilectomy/adenoidectomy with myringotomy       Hospital Course See H&P, Labs, Consult and Test reports for all details in brief, patient was admitted for   Principal Problem:   CAP (community acquired pneumonia) Active Problems:   Essential hypertension, benign   GERD (gastroesophageal reflux disease)   Prediabetes   Gout   Pneumonia  Admit HPI / Brief Narrative: 39 yo male with history significant for hypertension and hyperlipidemia who presented with 3 week history of cough and shortness of breath . Patient was started on doxycycline however experienced no improvement. The patient had a near syncopal episode the day of admission when coughing (he did not lose consciousness, but lost peripheral vision). Patient reported previous bronchitis episodes.  His x-ray did show left upper lobe infiltrate, so he was started on IV Rocephin and azithromycin for community-acquired pneumonia,, as well patient was started on IV steroids given his wheezing or bronchospasm, it was felt initially on IV steroids, with transition to oral, he'll be discharged home on Medrol pack.  LUL (lingula) Community-acquired pneumonia  Initially on IV Rocephin and azithromycin, to finish another 5 days  of oral levofloxacin as an outpatient.  Bronchospasm / acute asthma exacerbation Pt confirms he does not nor has he ever smoked - did have "childhood asthma" - is wheezing initially - started on IV steroids, no recurrence of wheezing at day of discharge, he is transition to Medrol pack as an outpatient, continue with MDI as needed .  Presyncopal episode probably vasovagal related to heavy hacking cough - EKG sinus brady at 58 but otherwise unrevealing w/ normal QTc  Hypertension BP currently well controlled  Obesity - Body mass index is 32.24 kg/(m^2).  Hyperlipidemia LDL 128 June 2015 - recheck  Prediabetes A1c is 6. - Counseled, coverage about diet and exercise, if no improvement may need to be started on metformin by his PCP as an outpatient.  Gout Well controlled at present     Significant Tests:  See full reports for all details    Dg Chest 2 View  09/18/2014   CLINICAL DATA:  Productive cough with congestion for 3 weeks.  EXAM: CHEST  2 VIEW  COMPARISON:  06/27/2014.  FINDINGS: Trachea is midline. Heart size normal. There is new airspace disease projecting over the heart on the lateral view, possibly within the lingula on the frontal view. Lungs are low in volume but otherwise clear. No pleural fluid.  IMPRESSION: Airspace opacification projecting over the heart on the lateral view may be within the lingula on the frontal view and is worrisome for pneumonia. Followup to clearing is recommended to exclude a centrally obstructing lesion.   Electronically Signed   By: Lorin Picket M.D.   On: 09/18/2014 12:30     Today  Subjective:   Elijah Ford today has no headache,no chest abdominal pain,no new weakness tingling or numbness, feels much better wants to go home today.   Objective:   Blood pressure 124/77, pulse 91, temperature 97.9 F (36.6 C), temperature source Oral, resp. rate 18, height 6\' 8"  (2.032 m), weight 133.1 kg (293 lb 6.9 oz), SpO2 96 %.  Intake/Output  Summary (Last 24 hours) at 09/20/14 1348 Last data filed at 09/20/14 0600  Gross per 24 hour  Intake   1760 ml  Output      0 ml  Net   1760 ml    Exam Awake Alert, Oriented *3, No new F.N deficits, Normal affect [place].AT,PERRAL Supple Neck,No JVD, No cervical lymphadenopathy appriciated.  Symmetrical Chest wall movement, Good air movement bilaterally, CTAB RRR,No Gallops,Rubs or new Murmurs, No Parasternal Heave +ve B.Sounds, Abd Soft, Non tender, No organomegaly appriciated, No rebound -guarding or rigidity. No Cyanosis, Clubbing or edema, No new Rash or bruise  Data Review     CBC w Diff: Lab Results  Component Value Date   WBC 10.1 09/20/2014   HGB 16.1 09/20/2014   HCT 46.7 09/20/2014   PLT 242 09/20/2014   LYMPHOPCT 21 09/18/2014   MONOPCT 7 09/18/2014   EOSPCT 5 09/18/2014   BASOPCT 2* 09/18/2014   CMP: Lab Results  Component Value Date   NA 136 09/20/2014   K 4.9 09/20/2014   CL 103 09/20/2014   CO2 23 09/20/2014   BUN 17 09/20/2014   CREATININE 1.40* 09/20/2014   CREATININE 1.20 07/08/2014   PROT 6.0 09/20/2014   ALBUMIN 3.9 09/20/2014   BILITOT 0.7 09/20/2014   ALKPHOS 56 09/20/2014   AST 29 09/20/2014   ALT 24 09/20/2014  .  Micro Results No results found for this or any previous visit (from the past 240 hour(s)).   Discharge Instructions     Please check CBC, BMP, chest x-ray during next visit. - May need oral hypoglycemic agent to be started as hemoglobin A1c is 6. Follow-up Information    Follow up with BREEBACK, JADE, PA-C. Schedule an appointment as soon as possible for a visit in 1 week.   Specialty:  Family Medicine   Contact information:   [address] [place] [address] [phone]       Discharge Medications     Medication List    STOP taking these medications        doxycycline 100 MG tablet  Commonly known as:  VIBRA-TABS     hydrOXYzine 50 MG capsule  Commonly known as:  VISTARIL     ibuprofen  800 MG tablet  Commonly known as:  ADVIL,MOTRIN     predniSONE 50 MG tablet  Commonly known as:  DELTASONE     terbinafine 250 MG tablet  Commonly known as:  LAMISIL      TAKE these medications        albuterol 108 (90 BASE) MCG/ACT inhaler  Commonly known as:  PROVENTIL HFA;VENTOLIN HFA  Inhale 2 puffs into the lungs every 6 (six) hours as needed for wheezing or shortness of breath.     allopurinol 300 MG tablet  Commonly known as:  ZYLOPRIM  Take 1 tablet (300 mg total) by mouth daily.     buPROPion 300 MG 24 hr tablet  Commonly known as:  WELLBUTRIN XL  Take 1 tablet (300 mg total) by mouth daily.     chlorpheniramine-HYDROcodone 10-8 MG/5ML Lqcr  Commonly known as:  TUSSIONEX  Take 5 mLs by mouth every 12 (twelve) hours as needed for cough (cough, will cause drowsiness.).     FLUoxetine 40 MG capsule  Commonly known as:  PROZAC  Take 1 capsule (40 mg total) by mouth daily.     gabapentin 300 MG capsule  Commonly known as:  NEURONTIN  One tab PO qHS for a week, then BID for a week, then TID. May double weekly to a max of 3,600mg /day     levofloxacin 750 MG tablet  Commonly known as:  LEVAQUIN  Take 1 tablet (750 mg total) by mouth daily.     methylPREDNIsolone 4 MG tablet  Commonly known as:  MEDROL DOSPACK  follow package directions     metoprolol succinate 50 MG 24 hr tablet  Commonly known as:  TOPROL-XL  Take 1 tablet (50 mg total) by mouth daily.     MUCINEX DM MAXIMUM STRENGTH PO  Take 1 tablet by mouth daily as needed (cold symptoms).     omeprazole 40 MG capsule  Commonly known as:  PRILOSEC  Take 1 capsule (40 mg total) by mouth daily.     Vitamin D (Ergocalciferol) 50000 UNITS Caps capsule  Commonly known as:  DRISDOL  Take 1 capsule (50,000 Units total) by mouth every 7 (seven) days.         Total Time in preparing paper work, data evaluation and todays exam - 35 minutes  Heliodoro Domagalski M.D on 09/20/2014 at 1:48 PM  [place]  [phone]

## 2014-09-21 LAB — HEMOGLOBIN A1C
Hgb A1c MFr Bld: 5.9 % — ABNORMAL HIGH (ref <5.7)
Mean Plasma Glucose: 123 mg/dL — ABNORMAL HIGH (ref <117)

## 2014-09-21 LAB — LEGIONELLA ANTIGEN, URINE

## 2014-09-21 LAB — HIV ANTIBODY (ROUTINE TESTING W REFLEX): HIV 1&2 Ab, 4th Generation: NONREACTIVE

## 2014-09-22 ENCOUNTER — Ambulatory Visit (INDEPENDENT_AMBULATORY_CARE_PROVIDER_SITE_OTHER): Payer: Managed Care, Other (non HMO) | Admitting: Physician Assistant

## 2014-09-22 ENCOUNTER — Encounter: Payer: Self-pay | Admitting: Physician Assistant

## 2014-09-22 VITALS — BP 123/60 | HR 81 | Temp 98.1°F | Ht >= 80 in | Wt 301.0 lb

## 2014-09-22 DIAGNOSIS — J189 Pneumonia, unspecified organism: Secondary | ICD-10-CM

## 2014-09-22 DIAGNOSIS — R748 Abnormal levels of other serum enzymes: Secondary | ICD-10-CM

## 2014-09-22 DIAGNOSIS — R7989 Other specified abnormal findings of blood chemistry: Secondary | ICD-10-CM

## 2014-09-22 MED ORDER — ALBUTEROL SULFATE (2.5 MG/3ML) 0.083% IN NEBU: 2.5000 mg | INHALATION_SOLUTION | RESPIRATORY_TRACT | Status: DC | PRN

## 2014-09-22 NOTE — Progress Notes (Signed)
   Subjective:    Patient ID: Elijah Ford, male    DOB: 11-13-1975, 39 y.o.   MRN: [id number]  HPI Pt presents to the clinic to follow from ER where discharged on 09/20/14 for CAP. He was given azithromycin and rocephin in hospital. Sent home with levaquin and medrol dose pack. Serum creatine elevated at 1.40 in hospital. Here for follow up today. Not started levaquin or prednisone that he was discharged with.    Review of Systems  All other systems reviewed and are negative.      Objective:   Physical Exam  Constitutional: He is oriented to person, place, and time. He appears well-developed and well-nourished.  HENT:  Head: Normocephalic and atraumatic.  Cardiovascular: Normal rate, regular rhythm and normal heart sounds.   Pulmonary/Chest:  Bilateral crackles, rhonchi, wheezing.  Worst on left.   Neurological: He is alert and oriented to person, place, and time.  Skin: Skin is dry.  Psychiatric: He has a normal mood and affect. His behavior is normal.          Assessment & Plan:  CAP- will recheck CBC and CXR in 2 days. Pt returned sooner than 1 week therefore will not recheck today. Still a lot of wheezing and crackles. Must go get levaquin today to start today. Set up with nebulizer to use every 2-4 hours as needed for SOB/Cough/Wheezing. Call if worsening or not improving. Written out of work for rest of week. Follow up on Friday for recheck.   Elevated serum creatine- will recheck in 2 days. Elevated in hospital.

## 2014-09-22 NOTE — Patient Instructions (Signed)
Get set up with home nebulizer.  Start levaquin and prednisone.  Recheck blood work in next 2 days.  Follow up on Friday to see if improving.

## 2014-09-24 ENCOUNTER — Ambulatory Visit (INDEPENDENT_AMBULATORY_CARE_PROVIDER_SITE_OTHER): Payer: Managed Care, Other (non HMO)

## 2014-09-24 DIAGNOSIS — J9811 Atelectasis: Secondary | ICD-10-CM

## 2014-09-24 DIAGNOSIS — R918 Other nonspecific abnormal finding of lung field: Secondary | ICD-10-CM

## 2014-09-24 LAB — CBC WITH DIFFERENTIAL/PLATELET
Basophils Absolute: 0 10*3/uL (ref 0.0–0.1)
Basophils Relative: 0 % (ref 0–1)
Eosinophils Absolute: 0 10*3/uL (ref 0.0–0.7)
Eosinophils Relative: 0 % (ref 0–5)
HCT: 51.8 % (ref 39.0–52.0)
Hemoglobin: 17.3 g/dL — ABNORMAL HIGH (ref 13.0–17.0)
Lymphocytes Relative: 9 % — ABNORMAL LOW (ref 12–46)
Lymphs Abs: 1.3 10*3/uL (ref 0.7–4.0)
MCH: 30.6 pg (ref 26.0–34.0)
MCHC: 33.5 g/dL (ref 30.0–36.0)
MCV: 91.6 fL (ref 78.0–100.0)
MONO ABS: 0.6 10*3/uL (ref 0.1–1.0)
MONOS PCT: 4 % (ref 3–12)
Neutro Abs: 12.7 10*3/uL — ABNORMAL HIGH (ref 1.7–7.7)
Neutrophils Relative %: 87 % — ABNORMAL HIGH (ref 43–77)
Platelets: 330 10*3/uL (ref 150–400)
RBC: 5.65 MIL/uL (ref 4.22–5.81)
RDW: 14 % (ref 11.5–15.5)
WBC: 14.6 10*3/uL — ABNORMAL HIGH (ref 4.0–10.5)

## 2014-09-24 LAB — BASIC METABOLIC PANEL
BUN: 13 mg/dL (ref 6–23)
CALCIUM: 10.3 mg/dL (ref 8.4–10.5)
CO2: 25 mEq/L (ref 19–32)
CREATININE: 1.36 mg/dL — AB (ref 0.50–1.35)
Chloride: 99 mEq/L (ref 96–112)
Glucose, Bld: 110 mg/dL — ABNORMAL HIGH (ref 70–99)
Potassium: 4.6 mEq/L (ref 3.5–5.3)
SODIUM: 139 meq/L (ref 135–145)

## 2014-09-24 LAB — CULTURE, BLOOD (ROUTINE X 2)
Culture: NO GROWTH
Culture: NO GROWTH

## 2014-09-25 LAB — CULTURE, BLOOD (ROUTINE X 2)
CULTURE: NO GROWTH
Culture: NO GROWTH

## 2014-09-26 ENCOUNTER — Ambulatory Visit (INDEPENDENT_AMBULATORY_CARE_PROVIDER_SITE_OTHER): Payer: Managed Care, Other (non HMO) | Admitting: Physician Assistant

## 2014-09-26 ENCOUNTER — Encounter: Payer: Self-pay | Admitting: Physician Assistant

## 2014-09-26 VITALS — BP 124/83 | HR 81 | Ht >= 80 in | Wt 304.0 lb

## 2014-09-26 DIAGNOSIS — R062 Wheezing: Secondary | ICD-10-CM

## 2014-09-26 DIAGNOSIS — R0602 Shortness of breath: Secondary | ICD-10-CM

## 2014-09-26 DIAGNOSIS — J189 Pneumonia, unspecified organism: Secondary | ICD-10-CM

## 2014-09-26 MED ORDER — HYDROCOD POLST-CHLORPHEN POLST 10-8 MG/5ML PO LQCR: 5.0000 mL | Freq: Two times a day (BID) | ORAL | Status: DC | PRN

## 2014-09-26 MED ORDER — METHYLPREDNISOLONE SODIUM SUCC 125 MG IJ SOLR
125.0000 mg | Freq: Once | INTRAMUSCULAR | Status: AC
  Administered 2014-09-26: 125 mg via INTRAMUSCULAR

## 2014-09-26 MED ORDER — LEVOFLOXACIN 750 MG PO TABS: 750.0000 mg | ORAL_TABLET | Freq: Every day | ORAL | Status: DC

## 2014-09-26 MED ORDER — METHYLPREDNISOLONE ACETATE 40 MG/ML IJ SUSP
40.0000 mg | Freq: Once | INTRAMUSCULAR | Status: AC
  Administered 2014-09-26: 40 mg via INTRAMUSCULAR

## 2014-09-26 NOTE — Patient Instructions (Addendum)
Given shot of solumedrol and depo medrol.  Given 5 more days of levaquin.  Continue nebulizer as needed.  symbicort sample 2 puffs twice a day.  tussinonex ONLY at bedtime  Follow up Tuesday.

## 2014-09-28 NOTE — Progress Notes (Signed)
   Subjective:    Patient ID: Elijah Ford, male    DOB: [birth year], 39 y.o.   MRN: [id number]  HPI Pt comes in to follow up on pneumonia. He is feeling better but still wheezing, SOB and weak. Concerned because he has a very physical job and does not think he could start back. No fever or chills. Coughing a lot. Continues to use nebulizer which is helping. Stopped prednisone 4 days early because did not like the way it made him feel. Finished levaquin.    Review of Systems  All other systems reviewed and are negative.      Objective:   Physical Exam  Constitutional: He is oriented to person, place, and time. He appears well-developed and well-nourished.  HENT:  Head: Normocephalic and atraumatic.  Cardiovascular: Normal rate and regular rhythm.   Pulmonary/Chest:  Bilateral lung wheezing.  Wheezing and crackles in right lung only.   Neurological: He is alert and oriented to person, place, and time.  Skin: Skin is dry.  Psychiatric: He has a normal mood and affect. His behavior is normal.          Assessment & Plan:  CAP/wheezing/SOB- spo2 continues to rise. Pt's lungs still sound very wheezes and with some remaining crackles in right lung. CXR reassuring. Due to patient becoming very SOB with exertion I still don't think ready to go to work with physical labor. Will write out until next Wednesday. Pt did stop prednisone and does not like to take oral prednisone. Solumedrol 125mg  and Depo medrol 40mg  IM given today. 5 more days of levaquin. Continue to use nebulizer as needed. tussinex only to use at bedtime. Considered starting symbicort nurse inadvertnely did not give inhaler. Will wait until follow up to asscess need.  If not improving next week will add that in.   Follow up next Tuesday to access going back to work. Discuss may still battle SOB for a few weeks.

## 2014-09-30 ENCOUNTER — Ambulatory Visit: Payer: Managed Care, Other (non HMO) | Admitting: Physician Assistant

## 2014-09-30 ENCOUNTER — Ambulatory Visit (INDEPENDENT_AMBULATORY_CARE_PROVIDER_SITE_OTHER): Payer: Managed Care, Other (non HMO) | Admitting: Physician Assistant

## 2014-09-30 ENCOUNTER — Encounter: Payer: Self-pay | Admitting: Physician Assistant

## 2014-09-30 VITALS — BP 113/74 | HR 71 | Ht >= 80 in | Wt 302.0 lb

## 2014-09-30 DIAGNOSIS — J189 Pneumonia, unspecified organism: Secondary | ICD-10-CM

## 2014-09-30 NOTE — Progress Notes (Signed)
   Subjective:    Patient ID: Elijah Ford, male    DOB: 1975/09/28, 39 y.o.   MRN: [id number]  HPI Pt presents to the clinic to follow up on pneumonia. Doing much better today. Cough almost completely resolved. No wheezing. Able to walk to mailbox and do duties around house with only minimal SOB. Not using nebulizer or rescue inhaler. One more day of levaquin to finish.    Review of Systems  All other systems reviewed and are negative.      Objective:   Physical Exam  Constitutional: He is oriented to person, place, and time. He appears well-developed and well-nourished.  HENT:  Head: Normocephalic and atraumatic.  Cardiovascular: Normal rate, regular rhythm and normal heart sounds.   Pulmonary/Chest: Effort normal and breath sounds normal. He has no wheezes.  Neurological: He is alert and oriented to person, place, and time.  Skin: Skin is dry.  Psychiatric: He has a normal mood and affect. His behavior is normal.          Assessment & Plan:  CAP/SOB- doing much better. BP great. Vital good. Lungs sound great with a lot of improvement. Pt still feels like job may be too demanding with physical load. He has to walk on average 12,000 steps a shift and lots of lifting. Discussed rehab plan for next week building up to work week with going back on 10/06/14. Follow up as needed.

## 2014-10-15 ENCOUNTER — Encounter: Payer: Self-pay | Admitting: Physician Assistant

## 2014-10-15 ENCOUNTER — Ambulatory Visit (INDEPENDENT_AMBULATORY_CARE_PROVIDER_SITE_OTHER): Payer: Managed Care, Other (non HMO) | Admitting: Physician Assistant

## 2014-10-15 VITALS — BP 116/80 | HR 66 | Temp 98.0°F | Wt 305.0 lb

## 2014-10-15 DIAGNOSIS — J209 Acute bronchitis, unspecified: Secondary | ICD-10-CM

## 2014-10-15 DIAGNOSIS — J45909 Unspecified asthma, uncomplicated: Secondary | ICD-10-CM | POA: Insufficient documentation

## 2014-10-15 DIAGNOSIS — Z8701 Personal history of pneumonia (recurrent): Secondary | ICD-10-CM

## 2014-10-15 DIAGNOSIS — J4521 Mild intermittent asthma with (acute) exacerbation: Secondary | ICD-10-CM | POA: Diagnosis not present

## 2014-10-15 MED ORDER — CEFTRIAXONE SODIUM 1 G IJ SOLR
1.0000 g | Freq: Once | INTRAMUSCULAR | Status: AC
  Administered 2014-10-15: 1 g via INTRAMUSCULAR

## 2014-10-15 MED ORDER — PREDNISONE 20 MG PO TABS: ORAL_TABLET | ORAL | Status: DC

## 2014-10-15 MED ORDER — METHYLPREDNISOLONE SODIUM SUCC 125 MG IJ SOLR
125.0000 mg | Freq: Once | INTRAMUSCULAR | Status: AC
  Administered 2014-10-15: 125 mg via INTRAMUSCULAR

## 2014-10-15 MED ORDER — AZITHROMYCIN 250 MG PO TABS: ORAL_TABLET | ORAL | Status: DC

## 2014-10-15 NOTE — Patient Instructions (Addendum)
Zpak/prednisone mucinex with lots of water. 2 tablets twice a day.  Nebulizer every 2 hours as needed.

## 2014-10-15 NOTE — Progress Notes (Signed)
   Subjective:    Patient ID: Elijah Ford, male    DOB: 03/19/1976, 39 y.o.   MRN: [id number]  HPI  Pt presents to the clinic with cough and wheezing that came back 1 day ago. He recently had PNE at beginning of January where he was hospitalized and took weeks to get strength back and stop wheezing. Using nebulizer 4-5 times a day. Cough and wheezing worsening. Very fatigued. No fever. Cough is productive. Not taking anything OTC. Pt admits he had to work 20 hours straight and all weekend long at [employer]. He was also out in the snow.    Review of Systems  All other systems reviewed and are negative.      Objective:   Physical Exam  Constitutional: He is oriented to person, place, and time. He appears well-developed and well-nourished.  HENT:  Head: Normocephalic and atraumatic.  Right Ear: External ear normal.  Left Ear: External ear normal.  Nose: Nose normal.  Mouth/Throat: Oropharynx is clear and moist. No oropharyngeal exudate.  Eyes: Conjunctivae are normal. Right eye exhibits no discharge. Left eye exhibits no discharge.  Neck: Normal range of motion. Neck supple.  Cardiovascular: Normal rate, regular rhythm and normal heart sounds.   Pulmonary/Chest:  Bilateral lung wheezing and rhonchi. Worse at base of left lungs.   Lymphadenopathy:    He has no cervical adenopathy.  Neurological: He is alert and oriented to person, place, and time.  Skin: Skin is dry.  Psychiatric: He has a normal mood and affect. His behavior is normal.          Assessment & Plan:  Asthmatic bronchitis- pulse ox and vitals reassuring. Will get cbc.  it seems like certainly asthma is making bronchitis worse despite never being dx with asthma. Once feeling better would like to get spironmetry. IM rocephin 1g and solumedrol 125mg  given today IM. Start zpak orally and prednisone taper tomorrow. Continue to use nebulizer every 2 hours as needed at least for next 2-3 days. Written out of work until  Monday. Drink water and 2 tabs of mucinex twice a day. Follow up as needed or if symptoms worsening.

## 2014-10-16 LAB — CBC WITH DIFFERENTIAL/PLATELET
BASOS ABS: 0.1 10*3/uL (ref 0.0–0.1)
Basophils Relative: 1 % (ref 0–1)
EOS ABS: 0.4 10*3/uL (ref 0.0–0.7)
EOS PCT: 7 % — AB (ref 0–5)
HEMATOCRIT: 45.8 % (ref 39.0–52.0)
Hemoglobin: 15.9 g/dL (ref 13.0–17.0)
LYMPHS ABS: 1.4 10*3/uL (ref 0.7–4.0)
Lymphocytes Relative: 22 % (ref 12–46)
MCH: 30.9 pg (ref 26.0–34.0)
MCHC: 34.7 g/dL (ref 30.0–36.0)
MCV: 89.1 fL (ref 78.0–100.0)
MPV: 9 fL (ref 8.6–12.4)
Monocytes Absolute: 0.5 10*3/uL (ref 0.1–1.0)
Monocytes Relative: 8 % (ref 3–12)
NEUTROS ABS: 4 10*3/uL (ref 1.7–7.7)
NEUTROS PCT: 62 % (ref 43–77)
Platelets: 272 10*3/uL (ref 150–400)
RBC: 5.14 MIL/uL (ref 4.22–5.81)
RDW: 14.8 % (ref 11.5–15.5)
WBC: 6.4 10*3/uL (ref 4.0–10.5)

## 2014-10-17 ENCOUNTER — Telehealth: Payer: Self-pay | Admitting: *Deleted

## 2014-10-17 ENCOUNTER — Telehealth: Payer: Self-pay

## 2014-10-17 ENCOUNTER — Ambulatory Visit (INDEPENDENT_AMBULATORY_CARE_PROVIDER_SITE_OTHER): Payer: Managed Care, Other (non HMO)

## 2014-10-17 DIAGNOSIS — R062 Wheezing: Secondary | ICD-10-CM

## 2014-10-17 DIAGNOSIS — R0602 Shortness of breath: Secondary | ICD-10-CM

## 2014-10-17 DIAGNOSIS — R059 Cough, unspecified: Secondary | ICD-10-CM

## 2014-10-17 DIAGNOSIS — R05 Cough: Secondary | ICD-10-CM

## 2014-10-17 MED ORDER — IOHEXOL 350 MG/ML SOLN
120.0000 mL | Freq: Once | INTRAVENOUS | Status: AC | PRN
  Administered 2014-10-17: 120 mL via INTRAVENOUS

## 2014-10-17 NOTE — Telephone Encounter (Signed)
CT Angio chest PEw/cm &/or wo cm prior auth approved [id number]

## 2014-10-17 NOTE — Telephone Encounter (Signed)
Pt's wife called with worsening SOB. Cannot walk long distances. concerned

## 2014-10-20 ENCOUNTER — Encounter: Payer: Self-pay | Admitting: Physician Assistant

## 2014-10-20 ENCOUNTER — Ambulatory Visit (INDEPENDENT_AMBULATORY_CARE_PROVIDER_SITE_OTHER): Payer: Commercial Managed Care - PPO | Admitting: Physician Assistant

## 2014-10-20 VITALS — BP 114/72 | HR 78 | Ht >= 80 in | Wt 308.0 lb

## 2014-10-20 DIAGNOSIS — R061 Stridor: Secondary | ICD-10-CM

## 2014-10-20 DIAGNOSIS — R062 Wheezing: Secondary | ICD-10-CM | POA: Diagnosis not present

## 2014-10-20 DIAGNOSIS — J4521 Mild intermittent asthma with (acute) exacerbation: Secondary | ICD-10-CM

## 2014-10-20 MED ORDER — LEVOFLOXACIN 750 MG PO TABS: 750.0000 mg | ORAL_TABLET | Freq: Every day | ORAL | Status: DC

## 2014-10-20 MED ORDER — BUDESONIDE-FORMOTEROL FUMARATE 160-4.5 MCG/ACT IN AERO: 2.0000 | INHALATION_SPRAY | Freq: Two times a day (BID) | RESPIRATORY_TRACT | Status: DC

## 2014-10-20 MED ORDER — MONTELUKAST SODIUM 10 MG PO TABS: 10.0000 mg | ORAL_TABLET | Freq: Every day | ORAL | Status: DC

## 2014-10-20 MED ORDER — IPRATROPIUM-ALBUTEROL 0.5-2.5 (3) MG/3ML IN SOLN
3.0000 mL | Freq: Once | RESPIRATORY_TRACT | Status: AC
  Administered 2014-10-20: 3 mL via RESPIRATORY_TRACT

## 2014-10-20 NOTE — Patient Instructions (Signed)
Start levaquin.  Start symbicort 2 puffs twice a day.  Start singulair.  Start zyrtec daily.

## 2014-10-21 NOTE — Progress Notes (Addendum)
   Subjective:    Patient ID: Elijah Ford, male    DOB: [birth year], 39 y.o.   MRN: [id number]  HPI Pt is a 39 yo male who presents to the clinic still complaining of SOB, wheezing, fatigue and weakness. On last day of zpak. Continues on prednisone taper. Using nebulizers fairly regularly 2-3 hours apart. On Friday we did get CTA to rule out blood clot in lungs. Negative.    Review of Systems  All other systems reviewed and are negative.      Objective:   Physical Exam  Constitutional:  Pt sits on exam room tablet hearing him rattle with normal breaths.   HENT:  Head: Normocephalic and atraumatic.  Right Ear: External ear normal.  Left Ear: External ear normal.  Nose: Nose normal.  Mouth/Throat: Oropharynx is clear and moist. No oropharyngeal exudate.  Eyes: Conjunctivae are normal. Right eye exhibits no discharge. Left eye exhibits no discharge.  Neck: Normal range of motion. Neck supple.  Cardiovascular: Normal rate, regular rhythm and normal heart sounds.   Pulmonary/Chest:  Pulse ox 98 Percent.  Diffuse vibratory rattling with some wheezing both lungs.   Lymphadenopathy:    He has no cervical adenopathy.  Skin: He is diaphoretic.  Psychiatric: He has a normal mood and affect. His behavior is normal.          Assessment & Plan:  Asthmatic bronchitis/stridor/wheezing/SOB- reassuring that pulse ox is so good. CTA on Friday negative for infection/CHF/PE. Eosinophils were slightly elevated. Start singulair and zyrtec at bedtime. Peak flows orginally red zone with almost passing out 260, 340, 330 after duoneb they were not improved at 300 and 200. This caused pt a lot of stress. Add symbicort 2 puffs bid as well. Start levquin 750mg  for 7 days to cover for any potential infection all though no overt infection found. Continue nebulizer at home as needed.  Brought supervising physician in today to listen and see if plan was appropriate. She agrees with plan. She is concerned of some  type up inspiratory upper respiraory, per haps of larynx cause of symptoms.  I am making urgent referral to pulmonology. Follow up on Friday or as needed.

## 2014-10-22 ENCOUNTER — Other Ambulatory Visit: Payer: Self-pay | Admitting: *Deleted

## 2014-10-24 ENCOUNTER — Ambulatory Visit: Payer: Managed Care, Other (non HMO) | Admitting: Physician Assistant

## 2014-11-07 ENCOUNTER — Other Ambulatory Visit: Payer: Self-pay | Admitting: Internal Medicine

## 2014-11-07 DIAGNOSIS — R059 Cough, unspecified: Secondary | ICD-10-CM

## 2014-11-07 DIAGNOSIS — R05 Cough: Secondary | ICD-10-CM

## 2014-11-26 ENCOUNTER — Encounter: Payer: Self-pay | Admitting: Physician Assistant

## 2014-11-26 ENCOUNTER — Ambulatory Visit (INDEPENDENT_AMBULATORY_CARE_PROVIDER_SITE_OTHER): Payer: Managed Care, Other (non HMO) | Admitting: Physician Assistant

## 2014-11-26 VITALS — BP 136/86 | HR 88 | Ht >= 80 in | Wt 315.0 lb

## 2014-11-26 DIAGNOSIS — J45901 Unspecified asthma with (acute) exacerbation: Secondary | ICD-10-CM | POA: Insufficient documentation

## 2014-11-26 DIAGNOSIS — J4540 Moderate persistent asthma, uncomplicated: Secondary | ICD-10-CM | POA: Diagnosis not present

## 2014-11-26 MED ORDER — LEVOCETIRIZINE DIHYDROCHLORIDE 5 MG PO TABS: 5.0000 mg | ORAL_TABLET | Freq: Every evening | ORAL | Status: DC

## 2014-11-26 NOTE — Progress Notes (Signed)
   Subjective:    Patient ID: Elijah Ford, male    DOB: 01/24/1976, 39 y.o.   MRN: [id number]  HPI  Patient is a 39 year old male who presents to the clinic to follow-up on asthma. He currently is still taking Spiriva and aware. He does still having mild cough with occasional shortness of breath and wheezing. He continues to stay on Singulair. He only uses his albuterol inhaler as needed. He does have a recent visit with his pulmonologist on 11/24/2014. Patient is concerned about going to work. He feels like the physical activity expected of him. Elijah Ford will likely cause exacerbation. He was able to walk around [place] the other day a couple of times and he felt okay. Still has days where there is more coughing and wheezing.    Review of Systems  All other systems reviewed and are negative.      Objective:   Physical Exam  Constitutional: He is oriented to person, place, and time. He appears well-developed and well-nourished.  HENT:  Head: Normocephalic and atraumatic.  Cardiovascular: Normal rate, regular rhythm and normal heart sounds.   Pulmonary/Chest:  Scattered rhonchi bilaterally. Occasional wheeze.   Neurological: He is alert and oriented to person, place, and time.  Skin: Skin is dry.  Psychiatric: He has a normal mood and affect. His behavior is normal.          Assessment & Plan:  Asthma, moderate persistent- pt see Dr. Michela Pitcher. Last note states to stop spirivia and continue dulera and albuterol as needed. May go back to work in the next couple of weeks. Pt is to be set up with CPAP. Still some rhonchi with occasional wheeze on exam. Continue on dulera will write out for 2 more weeks. Stay on singulair add zyrtec at bedtime. xyzal may be cheaper sent rx to try. Go back to work in 2 weeks unless otherwise stated.

## 2014-11-28 ENCOUNTER — Other Ambulatory Visit: Payer: Self-pay | Admitting: *Deleted

## 2014-11-28 DIAGNOSIS — J209 Acute bronchitis, unspecified: Secondary | ICD-10-CM

## 2014-11-28 MED ORDER — AZITHROMYCIN 250 MG PO TABS: ORAL_TABLET | ORAL | Status: DC

## 2014-12-09 ENCOUNTER — Encounter: Payer: Self-pay | Admitting: Physician Assistant

## 2014-12-09 ENCOUNTER — Ambulatory Visit (INDEPENDENT_AMBULATORY_CARE_PROVIDER_SITE_OTHER): Payer: Managed Care, Other (non HMO) | Admitting: Physician Assistant

## 2014-12-09 VITALS — BP 133/87 | HR 93 | Ht >= 80 in | Wt 322.0 lb

## 2014-12-09 DIAGNOSIS — R399 Unspecified symptoms and signs involving the genitourinary system: Secondary | ICD-10-CM

## 2014-12-09 DIAGNOSIS — J4540 Moderate persistent asthma, uncomplicated: Secondary | ICD-10-CM | POA: Diagnosis not present

## 2014-12-09 DIAGNOSIS — R200 Anesthesia of skin: Secondary | ICD-10-CM

## 2014-12-09 DIAGNOSIS — I10 Essential (primary) hypertension: Secondary | ICD-10-CM

## 2014-12-09 DIAGNOSIS — M109 Gout, unspecified: Secondary | ICD-10-CM

## 2014-12-09 DIAGNOSIS — F32A Depression, unspecified: Secondary | ICD-10-CM

## 2014-12-09 DIAGNOSIS — F411 Generalized anxiety disorder: Secondary | ICD-10-CM

## 2014-12-09 DIAGNOSIS — R208 Other disturbances of skin sensation: Secondary | ICD-10-CM

## 2014-12-09 DIAGNOSIS — F329 Major depressive disorder, single episode, unspecified: Secondary | ICD-10-CM

## 2014-12-09 MED ORDER — METOPROLOL SUCCINATE ER 50 MG PO TB24: 50.0000 mg | ORAL_TABLET | Freq: Every day | ORAL | Status: DC

## 2014-12-09 MED ORDER — ALLOPURINOL 300 MG PO TABS: 300.0000 mg | ORAL_TABLET | Freq: Every day | ORAL | Status: DC

## 2014-12-09 MED ORDER — BUPROPION HCL ER (XL) 300 MG PO TB24: 300.0000 mg | ORAL_TABLET | Freq: Every day | ORAL | Status: DC

## 2014-12-09 MED ORDER — GABAPENTIN 300 MG PO CAPS: ORAL_CAPSULE | ORAL | Status: DC

## 2014-12-09 MED ORDER — FLUOXETINE HCL 40 MG PO CAPS: 40.0000 mg | ORAL_CAPSULE | Freq: Every day | ORAL | Status: DC

## 2014-12-09 MED ORDER — TIOTROPIUM BROMIDE MONOHYDRATE 18 MCG IN CAPS: 18.0000 ug | ORAL_CAPSULE | Freq: Every day | RESPIRATORY_TRACT | Status: DC

## 2014-12-09 MED ORDER — MONTELUKAST SODIUM 10 MG PO TABS: 10.0000 mg | ORAL_TABLET | Freq: Every day | ORAL | Status: DC

## 2014-12-09 MED ORDER — LEVOCETIRIZINE DIHYDROCHLORIDE 5 MG PO TABS: 5.0000 mg | ORAL_TABLET | Freq: Every evening | ORAL | Status: DC

## 2014-12-09 MED ORDER — OMEPRAZOLE 40 MG PO CPDR: 40.0000 mg | DELAYED_RELEASE_CAPSULE | Freq: Every day | ORAL | Status: DC

## 2014-12-09 NOTE — Patient Instructions (Signed)
Will refer to allergist.  Beta glucan tablet twice a day.  Start back on spirvia with dulera.  Can go back to work on 4th of April with restrictions walking over 20 minutes at a time, lifting over 50lbs for more than 20 minutes.

## 2014-12-09 NOTE — Progress Notes (Addendum)
   Subjective:    Patient ID: Elijah Ford, male    DOB: 02-08-1976, 39 y.o.   MRN: [id number]  HPI  Patient is a 39 year old male who has been battling new onset asthma symptoms since December 2015 that started with pneumonia and bronchitis and have left him with a persistent cough, wheezing and shortness of breath. He is currently been out of work for multiple weeks. He has seen a pulmonologist who has done spirometry and diagnosed him with asthma. He's been off spur Riva for one week based on the bicep pulmonologist that he feels like his cough and wheezing has worsened he has remained on Dulera, xyzal, Singulair, and rescue inhaler as needed. He is trying not to use his rescue inhaler.  Patient also mentions some urinary discomfort and increase in frequency for the last 2-3 days. He denies any fever, chills, back pain, nausea, vomiting or abdominal pain. Not tried anything to make better.  Patient also like some refills on medication today.    Review of Systems  All other systems reviewed and are negative.      Objective:   Physical Exam  Constitutional: He is oriented to person, place, and time. He appears well-developed and well-nourished.  HENT:  Head: Normocephalic and atraumatic.  Cardiovascular: Normal rate, regular rhythm and normal heart sounds.   Pulmonary/Chest:  Decreased effort. Significant coughing with doing peak flow. Bilateral lungs with significant rhonchi and some wheezing scattered bilaterally.  Neurological: He is alert and oriented to person, place, and time.  Skin: Skin is dry.  Psychiatric: He has a normal mood and affect. His behavior is normal.          Assessment & Plan:  Asthma moderate persistent-I will write for patient to start work 12/22/2014. I would like for patient to go ahead and start back on Spiriva along with Dulera, xyzal, singulair, and rescue as needed. I would like for him to follow-up with his pulmonologist to discuss why his symptoms  continue and no real relief. His peak flow today was 370. His lung exam is still consistent with wheezing and rhonchi. Medications were refilled.  Paperwork was filled out today for patient to return to work 12/22/2014 with restrictions.  Urinary tract symptoms-try to get urine sample today. Patient states he cannot give it. Discussed coming back with sample the next 24 hours. Patient aware and will test.  Anxiety-well-controlled today. Refilled Prozac and Wellbutrin for 6 months.  Hypertension-well controlled refilled Toprol for 6 months.  Gout-patient is having no problems or concerns. Refilled preventative for 6 months.  Numbness in toes-Dr. T prescribed Neurontin. Refilled today.

## 2015-02-04 ENCOUNTER — Other Ambulatory Visit: Payer: Self-pay | Admitting: *Deleted

## 2015-02-04 DIAGNOSIS — M109 Gout, unspecified: Secondary | ICD-10-CM

## 2015-02-04 MED ORDER — MONTELUKAST SODIUM 10 MG PO TABS: 10.0000 mg | ORAL_TABLET | Freq: Every day | ORAL | Status: DC

## 2015-02-04 MED ORDER — ALLOPURINOL 300 MG PO TABS: 300.0000 mg | ORAL_TABLET | Freq: Every day | ORAL | Status: DC

## 2015-02-13 ENCOUNTER — Ambulatory Visit (INDEPENDENT_AMBULATORY_CARE_PROVIDER_SITE_OTHER): Payer: Commercial Managed Care - PPO | Admitting: Family Medicine

## 2015-02-13 ENCOUNTER — Encounter: Payer: Self-pay | Admitting: Family Medicine

## 2015-02-13 VITALS — BP 118/80 | HR 92 | Wt 314.0 lb

## 2015-02-13 DIAGNOSIS — IMO0001 Reserved for inherently not codable concepts without codable children: Secondary | ICD-10-CM

## 2015-02-13 DIAGNOSIS — R61 Generalized hyperhidrosis: Secondary | ICD-10-CM | POA: Diagnosis not present

## 2015-02-13 DIAGNOSIS — Z6834 Body mass index (BMI) 34.0-34.9, adult: Secondary | ICD-10-CM

## 2015-02-13 DIAGNOSIS — R635 Abnormal weight gain: Secondary | ICD-10-CM

## 2015-02-13 MED ORDER — PHENTERMINE HCL 37.5 MG PO CAPS: 37.5000 mg | ORAL_CAPSULE | ORAL | Status: DC

## 2015-02-13 NOTE — Progress Notes (Signed)
   Subjective:    Patient ID: Elijah Ford, male    DOB: 1976-05-02, 39 y.o.   MRN: [id number]  HPI Abnormal weight gain/BMI 34 - was on phentermine months ago until got really sick with several pulmonary infection secondary to mold in the house. He had stopped the medication at that time. He is actually been fairly sedentary for months and continue to gain weight. Over the last few weeks becoming more active he has been able to get his weight down to 314 pounds on his own. He would really like to get back on the phentermine. When he took it previously he did not have any palms with chest pain and palpitations are elevated blood pressure levels. He does have hypertension but has been well controlled for quite some time. His  goal weight loss to 250lbs.  he actually plans on certain to train for a 5K in the fall with a friend. He's also started drinking more water and has been trying to really increase the vegetables in his diet.  He also complains of excessive sweating. It's nothing new and has been going on for quite some time. It happens even with minimal activity.  Review of Systems     Objective:   Physical Exam  Constitutional: He is oriented to person, place, and time. He appears well-developed and well-nourished.  HENT:  Head: Normocephalic and atraumatic.  Cardiovascular: Normal rate, regular rhythm and normal heart sounds.   Pulmonary/Chest: Effort normal and breath sounds normal.  Neurological: He is alert and oriented to person, place, and time.  Skin: Skin is warm and dry.  Psychiatric: He has a normal mood and affect. His behavior is normal.          Assessment & Plan:  Abnormal weight gain/BMI 34-discussed different options. He is most interested in going back on phentermine. Will restart medication. Discussed potential side effects. Stop immediately if any chest pain short of breath or palpitations. We'll monitor for any side effects. He can follow-up in one month for  nurse blood pressure and weight check especially since he's taken this before. We discussed setting some goals for himself including setting daily caloric goals. I recommended the smart phone application called [employer] fitness pal. He are he has a plan in place for exercise. I think he is going to be quite successful. Follow-up with Jade in 3 months.  Excessive sweating-we discussed that weight loss can sometimes improve this significantly. Obesity can sometimes increase estrogenic levels which can affect sweating as well. We will check his thyroid as well as a blood count to rule out anemia. Also explained that sometimes this is just normal area at age person is different and how easily they sweat.  Time spent 20 min, greater than 50% time spent counseling about need for weight loss, BMI 34

## 2015-02-13 NOTE — Patient Instructions (Signed)
[employer] - Sport and exercise psychologist.

## 2015-03-13 ENCOUNTER — Ambulatory Visit (INDEPENDENT_AMBULATORY_CARE_PROVIDER_SITE_OTHER): Payer: Commercial Managed Care - PPO | Admitting: Sports Medicine

## 2015-03-13 ENCOUNTER — Encounter: Payer: Self-pay | Admitting: Sports Medicine

## 2015-03-13 VITALS — BP 113/80 | HR 57 | Ht >= 80 in | Wt 303.0 lb

## 2015-03-13 DIAGNOSIS — L989 Disorder of the skin and subcutaneous tissue, unspecified: Secondary | ICD-10-CM | POA: Diagnosis not present

## 2015-03-13 DIAGNOSIS — E669 Obesity, unspecified: Secondary | ICD-10-CM

## 2015-03-13 MED ORDER — TAMSULOSIN HCL 0.4 MG PO CAPS: 0.4000 mg | ORAL_CAPSULE | Freq: Every day | ORAL | Status: DC

## 2015-03-13 MED ORDER — PHENTERMINE HCL 37.5 MG PO TABS: ORAL_TABLET | ORAL | Status: DC

## 2015-03-13 MED ORDER — LIRAGLUTIDE -WEIGHT MANAGEMENT 18 MG/3ML ~~LOC~~ SOPN: 3.0000 mg | PEN_INJECTOR | Freq: Every day | SUBCUTANEOUS | Status: DC

## 2015-03-13 MED ORDER — TOPIRAMATE 50 MG PO TABS: ORAL_TABLET | ORAL | Status: DC

## 2015-03-13 NOTE — Assessment & Plan Note (Addendum)
Well with good weight loss after the first month. He is having some urinary obstructive symptoms so we will decrease phentermine to one half tab twice a day rather than a full tab daily. I'm going to add Topamax as well as Saxenda for additional weight loss per patient request. He will return for a nurse visit for Saxenda. If unable to obtain Saxenda I think we should simply use Victoza I'm also going to give him bit of Flomax to use if splitting the dose in half and using it twice a day is ineffective.

## 2015-03-13 NOTE — Assessment & Plan Note (Signed)
Last webspace of the right foot. Appears to be a melanoma.  Referral to dermatology for consideration of excision considering precarious location.

## 2015-03-13 NOTE — Patient Instructions (Signed)
Go to www.saxenda.com and print out savings coupon after registering.

## 2015-03-13 NOTE — Progress Notes (Signed)
  Subjective:    CC: Weight check  HPI: Obesity: Greater than 10 pound weight loss after the first month on phentermine, he has not had any palpitations, chest pain, he does have a bit of obstructive urinary symptoms, with hesitancy, dribbling and frequency. Otherwise no issues. He does want to accelerate his weight loss and wonders what else can be done.  Skin lesion: Between the right fourth webspace, there is a dark lesion. This is been there for some time.  Past medical history, Surgical history, Family history not pertinant except as noted below, Social history, Allergies, and medications have been entered into the medical record, reviewed, and no changes needed.   Review of Systems: No fevers, chills, night sweats, weight loss, chest pain, or shortness of breath.   Objective:    General: Well Developed, well nourished, and in no acute distress.  Neuro: Alert and oriented x3, extra-ocular muscles intact, sensation grossly intact.  HEENT: Normocephalic, atraumatic, pupils equal round reactive to light, neck supple, no masses, no lymphadenopathy, thyroid nonpalpable.  Skin: Warm and dry, there is a 1 cm, dark, variegated lesion in the fourth webspace of the right foot. This is concerning for melanoma versus an atypical or dysplastic nevus. Cardiac: Regular rate and rhythm, no murmurs rubs or gallops, no lower extremity edema.  Respiratory: Clear to auscultation bilaterally. Not using accessory muscles, speaking in full sentences.  Impression and Recommendations:

## 2015-03-14 LAB — CBC WITH DIFFERENTIAL/PLATELET
BASOS ABS: 0.1 10*3/uL (ref 0.0–0.1)
BASOS PCT: 1 % (ref 0–1)
EOS ABS: 0.3 10*3/uL (ref 0.0–0.7)
Eosinophils Relative: 5 % (ref 0–5)
HCT: 49 % (ref 39.0–52.0)
Hemoglobin: 16.4 g/dL (ref 13.0–17.0)
LYMPHS ABS: 1.6 10*3/uL (ref 0.7–4.0)
Lymphocytes Relative: 32 % (ref 12–46)
MCH: 29.7 pg (ref 26.0–34.0)
MCHC: 33.5 g/dL (ref 30.0–36.0)
MCV: 88.6 fL (ref 78.0–100.0)
MONOS PCT: 8 % (ref 3–12)
MPV: 9.2 fL (ref 8.6–12.4)
Monocytes Absolute: 0.4 10*3/uL (ref 0.1–1.0)
Neutro Abs: 2.7 10*3/uL (ref 1.7–7.7)
Neutrophils Relative %: 54 % (ref 43–77)
Platelets: 263 10*3/uL (ref 150–400)
RBC: 5.53 MIL/uL (ref 4.22–5.81)
RDW: 15.1 % (ref 11.5–15.5)
WBC: 5 10*3/uL (ref 4.0–10.5)

## 2015-03-14 LAB — TSH: TSH: 1.157 u[IU]/mL (ref 0.350–4.500)

## 2015-03-27 ENCOUNTER — Ambulatory Visit: Payer: Commercial Managed Care - PPO

## 2015-04-10 DIAGNOSIS — R339 Retention of urine, unspecified: Secondary | ICD-10-CM | POA: Insufficient documentation

## 2015-04-10 DIAGNOSIS — N503 Cyst of epididymis: Secondary | ICD-10-CM | POA: Insufficient documentation

## 2015-04-10 DIAGNOSIS — N529 Male erectile dysfunction, unspecified: Secondary | ICD-10-CM | POA: Insufficient documentation

## 2015-05-29 ENCOUNTER — Other Ambulatory Visit: Payer: Self-pay | Admitting: *Deleted

## 2015-05-29 DIAGNOSIS — M109 Gout, unspecified: Secondary | ICD-10-CM

## 2015-05-29 MED ORDER — ALLOPURINOL 300 MG PO TABS: 300.0000 mg | ORAL_TABLET | Freq: Every day | ORAL | Status: DC

## 2015-06-18 ENCOUNTER — Encounter: Payer: Self-pay | Admitting: Physician Assistant

## 2015-06-19 ENCOUNTER — Telehealth: Payer: Self-pay | Admitting: *Deleted

## 2015-06-19 ENCOUNTER — Encounter: Payer: Self-pay | Admitting: Physician Assistant

## 2015-06-19 DIAGNOSIS — R7989 Other specified abnormal findings of blood chemistry: Secondary | ICD-10-CM

## 2015-06-19 DIAGNOSIS — Z79899 Other long term (current) drug therapy: Secondary | ICD-10-CM

## 2015-06-19 NOTE — Telephone Encounter (Signed)
Labs ordered.

## 2015-06-20 ENCOUNTER — Other Ambulatory Visit: Payer: Self-pay | Admitting: Sports Medicine

## 2015-06-24 ENCOUNTER — Other Ambulatory Visit: Payer: Self-pay | Admitting: Physician Assistant

## 2015-06-25 LAB — HEPATIC FUNCTION PANEL
ALK PHOS: 52 U/L (ref 40–115)
ALT: 35 U/L (ref 9–46)
AST: 23 U/L (ref 10–40)
Albumin: 4.8 g/dL (ref 3.6–5.1)
BILIRUBIN DIRECT: 0.2 mg/dL (ref <=0.2)
BILIRUBIN INDIRECT: 0.6 mg/dL (ref 0.2–1.2)
Total Bilirubin: 0.8 mg/dL (ref 0.2–1.2)
Total Protein: 7.1 g/dL (ref 6.1–8.1)

## 2015-06-25 LAB — CBC WITH DIFFERENTIAL/PLATELET
Basophils Absolute: 0.1 10*3/uL (ref 0.0–0.1)
Basophils Relative: 1 % (ref 0–1)
EOS ABS: 0.6 10*3/uL (ref 0.0–0.7)
Eosinophils Relative: 8 % — ABNORMAL HIGH (ref 0–5)
HCT: 47.2 % (ref 39.0–52.0)
HEMOGLOBIN: 16.5 g/dL (ref 13.0–17.0)
LYMPHS ABS: 1.8 10*3/uL (ref 0.7–4.0)
Lymphocytes Relative: 25 % (ref 12–46)
MCH: 29.9 pg (ref 26.0–34.0)
MCHC: 35 g/dL (ref 30.0–36.0)
MCV: 85.7 fL (ref 78.0–100.0)
MONOS PCT: 7 % (ref 3–12)
MPV: 9.3 fL (ref 8.6–12.4)
Monocytes Absolute: 0.5 10*3/uL (ref 0.1–1.0)
NEUTROS PCT: 59 % (ref 43–77)
Neutro Abs: 4.2 10*3/uL (ref 1.7–7.7)
PLATELETS: 269 10*3/uL (ref 150–400)
RBC: 5.51 MIL/uL (ref 4.22–5.81)
RDW: 14.8 % (ref 11.5–15.5)
WBC: 7.2 10*3/uL (ref 4.0–10.5)

## 2015-06-25 LAB — PSA: PSA: 0.5 ng/mL (ref <=4.00)

## 2015-06-26 ENCOUNTER — Encounter: Payer: Self-pay | Admitting: Physician Assistant

## 2015-06-26 DIAGNOSIS — D721 Eosinophilia, unspecified: Secondary | ICD-10-CM | POA: Insufficient documentation

## 2015-06-26 LAB — TESTOSTERONE, FREE, DIRECT: Free Testosterone, Direct: 5 pg/mL (ref 3.8–34.2)

## 2015-06-27 LAB — TESTOSTERONE: Testosterone: 166 ng/dL — ABNORMAL LOW (ref 300–890)

## 2015-06-29 ENCOUNTER — Encounter: Payer: Self-pay | Admitting: Physician Assistant

## 2015-07-06 ENCOUNTER — Ambulatory Visit (INDEPENDENT_AMBULATORY_CARE_PROVIDER_SITE_OTHER): Payer: 59 | Admitting: Physician Assistant

## 2015-07-06 ENCOUNTER — Encounter: Payer: Self-pay | Admitting: Physician Assistant

## 2015-07-06 VITALS — BP 149/97 | HR 70 | Wt 312.0 lb

## 2015-07-06 DIAGNOSIS — E291 Testicular hypofunction: Secondary | ICD-10-CM | POA: Diagnosis not present

## 2015-07-06 DIAGNOSIS — E669 Obesity, unspecified: Secondary | ICD-10-CM | POA: Diagnosis not present

## 2015-07-06 DIAGNOSIS — Z23 Encounter for immunization: Secondary | ICD-10-CM | POA: Diagnosis not present

## 2015-07-06 MED ORDER — PHENTERMINE HCL 37.5 MG PO TABS: ORAL_TABLET | ORAL | Status: DC

## 2015-07-06 MED ORDER — TESTOSTERONE CYPIONATE 200 MG/ML IM SOLN
200.0000 mg | Freq: Once | INTRAMUSCULAR | Status: AC
  Administered 2015-07-06: 200 mg via INTRAMUSCULAR

## 2015-07-06 NOTE — Progress Notes (Signed)
Elijah Ford is a 39 y.o. male who presents to [facility]  today to discuss testosterone replacement therapy as well as a refill on phentermine. Elijah Ford states that he would like to begin testosterone therapy as soon as possible because he feels he is experiencing fatigue and an inability to exercise due to his low testosterone levels. Additionally, he would like a refill on his phentermine because he has not been taking it for several months and has gained some weight back. He denies anxiety, chest pain, palpitations or insomnia.   Past Medical History  Diagnosis Date  . Hypertension   . Hyperlipidemia    Past Surgical History  Procedure Laterality Date  . Uvula removal    . Tonsilectomy/adenoidectomy with myringotomy     Social History  Substance Use Topics  . Smoking status: Never Smoker   . Smokeless tobacco: Not on file  . Alcohol Use: No   family history includes Alcohol abuse in his brother and brother; Cancer in his father; Depression in his brother, brother, and mother.  ROS as above Medications: Current Outpatient Prescriptions  Medication Sig Dispense Refill  . allopurinol (ZYLOPRIM) 300 MG tablet Take 1 tablet (300 mg total) by mouth daily. 30 tablet 3  . buPROPion (WELLBUTRIN XL) 300 MG 24 hr tablet Take 1 tablet (300 mg total) by mouth daily. 90 tablet 1  . FLUoxetine (PROZAC) 40 MG capsule TAKE 1 CAPSULE (40 MG TOTAL) BY MOUTH DAILY. 90 capsule 0  . omeprazole (PRILOSEC) 40 MG capsule Take 1 capsule (40 mg total) by mouth daily. 90 capsule 4  . phentermine (ADIPEX-P) 37.5 MG tablet One half tab twice a day 30 tablet 0  . tamsulosin (FLOMAX) 0.4 MG CAPS capsule Take 1 capsule (0.4 mg total) by mouth daily after breakfast. 30 capsule 3   No current facility-administered medications for this visit.   Allergies  Allergen Reactions  . Other Itching    melons     Exam:  BP 149/97 mmHg  Pulse 70  Wt 312 lb (141.522 kg) Gen:  WDWN male in no acute distress.  HEENT: EOMI,  MMM Lungs: Normal work of breathing. CTABL Heart: RRR no MRG Abd: NABS, Soft. Nondistended, Nontender Exts: Brisk capillary refill, warm and well perfused.   Elijah Ford received a 200 mg injection of testosterone in the office today.  Assessment: 1. Low testosterone based on previous lab results, patient h/o testosterone therapy and description of patient symptoms.  2. Obesity based on BMI of 34.3    Plan: 1. Patient received his first 200 mg injection of testosterone in the office today and was told to return in two weeks for his next shot. Risks, benefits and alternatives were discussed with the patient and patient conveyed understanding. Will recheck testosterone in 3 months.pt request will check estrogen as well with testosterone. Please see discharge instructions. 2. Prescription for phentermine 37.5 mg tablet sent to patient's pharmacy. Risks, benefits and alternatives were discussed with the patient and patient conveyed understanding. May consider also adding belviq but decided to hold off at this point. Encouraged exercise and proper diet.   Please see discharge instructions.     Reviewed and changes made by Iran Planas PA-C

## 2015-07-17 ENCOUNTER — Other Ambulatory Visit: Payer: Self-pay | Admitting: Physician Assistant

## 2015-07-20 ENCOUNTER — Ambulatory Visit (INDEPENDENT_AMBULATORY_CARE_PROVIDER_SITE_OTHER): Payer: 59 | Admitting: Physician Assistant

## 2015-07-20 VITALS — BP 125/71 | HR 81 | Wt 311.0 lb

## 2015-07-20 DIAGNOSIS — E291 Testicular hypofunction: Secondary | ICD-10-CM

## 2015-07-20 MED ORDER — TESTOSTERONE CYPIONATE 200 MG/ML IM SOLN
200.0000 mg | Freq: Once | INTRAMUSCULAR | Status: AC
  Administered 2015-07-20: 200 mg via INTRAMUSCULAR

## 2015-07-20 NOTE — Progress Notes (Signed)
   Subjective:    Patient ID: Elijah Ford, male    DOB: May 15, 1976, 39 y.o.   MRN: [id number]  HPI  Patient came into office today for testosterone injection. Denies chest pain, shortness of breath, headaches and problems associated with taking this medication. Patient states he has had no abnornal mood swings. Patient is also on Phentermine, will be due for a refill at his next testosterone visit if appropriate.   Review of Systems     Objective:   Physical Exam        Assessment & Plan:  Patient tolerated injection in [place] well without complications. Patient advised to schedule his next injection for 2 weeks from today.

## 2015-08-03 ENCOUNTER — Ambulatory Visit: Payer: 59

## 2015-08-03 ENCOUNTER — Ambulatory Visit (INDEPENDENT_AMBULATORY_CARE_PROVIDER_SITE_OTHER): Payer: 59 | Admitting: Physician Assistant

## 2015-08-03 ENCOUNTER — Other Ambulatory Visit: Payer: Self-pay | Admitting: Physician Assistant

## 2015-08-03 VITALS — BP 128/82 | HR 69 | Resp 16 | Wt 302.0 lb

## 2015-08-03 DIAGNOSIS — R635 Abnormal weight gain: Secondary | ICD-10-CM | POA: Diagnosis not present

## 2015-08-03 DIAGNOSIS — E669 Obesity, unspecified: Secondary | ICD-10-CM

## 2015-08-03 DIAGNOSIS — E291 Testicular hypofunction: Secondary | ICD-10-CM | POA: Diagnosis not present

## 2015-08-03 MED ORDER — TESTOSTERONE CYPIONATE 200 MG/ML IM SOLN
200.0000 mg | INTRAMUSCULAR | Status: DC
  Administered 2015-08-03: 200 mg via INTRAMUSCULAR

## 2015-08-03 MED ORDER — PHENTERMINE HCL 37.5 MG PO TABS: ORAL_TABLET | ORAL | Status: DC

## 2015-08-03 NOTE — Progress Notes (Signed)
   Subjective:    Patient ID: Elijah Ford, male    DOB: 27-Nov-1975, 39 y.o.   MRN: [id number]  HPIPatient here for testosterone injection: denies chest pain, shortness of breath, headaaches and problems with medication or mood changes. Patient also here for blood pressure and weight check on phentermine: denies trouble sleeping, palpitations or medication problems.    Review of Systems     Objective:   Physical Exam        Assessment & Plan:  Patient tolerated injection well without complications. Patient has lost weight; a refill for phentermine will be faxed to [place]. Patient to follow up with nurse in 14 days.

## 2015-08-17 ENCOUNTER — Ambulatory Visit: Payer: Self-pay

## 2015-08-31 ENCOUNTER — Ambulatory Visit: Payer: Self-pay

## 2015-08-31 ENCOUNTER — Other Ambulatory Visit: Payer: Self-pay | Admitting: Physician Assistant

## 2015-09-04 ENCOUNTER — Ambulatory Visit (INDEPENDENT_AMBULATORY_CARE_PROVIDER_SITE_OTHER): Payer: 59 | Admitting: Family Medicine

## 2015-09-04 VITALS — BP 125/91 | HR 76 | Wt 299.0 lb

## 2015-09-04 DIAGNOSIS — R635 Abnormal weight gain: Secondary | ICD-10-CM

## 2015-09-04 DIAGNOSIS — E291 Testicular hypofunction: Secondary | ICD-10-CM

## 2015-09-04 DIAGNOSIS — E669 Obesity, unspecified: Secondary | ICD-10-CM

## 2015-09-04 MED ORDER — TESTOSTERONE CYPIONATE 200 MG/ML IM SOLN
200.0000 mg | Freq: Once | INTRAMUSCULAR | Status: AC
  Administered 2015-09-04: 200 mg via INTRAMUSCULAR

## 2015-09-04 MED ORDER — PHENTERMINE HCL 37.5 MG PO TABS: ORAL_TABLET | ORAL | Status: DC

## 2015-09-04 NOTE — Progress Notes (Signed)
   Subjective:    Patient ID: Elijah Ford, male    DOB: June 09, 1976, 39 y.o.   MRN: [id number]  HPI  RASHAUN STURCH is here for a testosterone injection. Denies chest pain, shortness of breath, headaches or mood changes.   Abnormal weight gain - TYREIK TRAYER is here for blood pressure and weight check. Diet and exercise is going well. Denies trouble sleeping or palpitations.   Review of Systems     Objective:   Physical Exam        Assessment & Plan:  Hypogonadism - Patient tolerated injection well without complications. Patient advised to schedule next injection 14 days from today.   Abnormal weight gain - Patient has lost weight. A refill of the phentermine will be faxed to [facility].

## 2015-09-05 NOTE — Progress Notes (Signed)
Abnormal weight gain - down 3 lbs.  Will refill phentermine. F/U in 1 mo  Beatrice Lecher, MD

## 2015-09-15 ENCOUNTER — Ambulatory Visit: Payer: Self-pay

## 2015-09-23 ENCOUNTER — Ambulatory Visit (INDEPENDENT_AMBULATORY_CARE_PROVIDER_SITE_OTHER): Payer: 59 | Admitting: Family Medicine

## 2015-09-23 ENCOUNTER — Encounter: Payer: Self-pay | Admitting: Family Medicine

## 2015-09-23 ENCOUNTER — Ambulatory Visit: Payer: 59

## 2015-09-23 VITALS — BP 127/89 | HR 88 | Temp 98.1°F | Wt 313.0 lb

## 2015-09-23 DIAGNOSIS — J111 Influenza due to unidentified influenza virus with other respiratory manifestations: Secondary | ICD-10-CM | POA: Diagnosis not present

## 2015-09-23 DIAGNOSIS — E291 Testicular hypofunction: Secondary | ICD-10-CM | POA: Diagnosis not present

## 2015-09-23 DIAGNOSIS — R05 Cough: Secondary | ICD-10-CM | POA: Diagnosis not present

## 2015-09-23 DIAGNOSIS — R52 Pain, unspecified: Secondary | ICD-10-CM | POA: Diagnosis not present

## 2015-09-23 DIAGNOSIS — R69 Illness, unspecified: Principal | ICD-10-CM

## 2015-09-23 DIAGNOSIS — R059 Cough, unspecified: Secondary | ICD-10-CM

## 2015-09-23 LAB — POCT INFLUENZA A/B
INFLUENZA A, POC: NEGATIVE
INFLUENZA B, POC: NEGATIVE

## 2015-09-23 MED ORDER — TESTOSTERONE CYPIONATE 200 MG/ML IM SOLN
200.0000 mg | Freq: Once | INTRAMUSCULAR | Status: AC
  Administered 2015-09-23: 200 mg via INTRAMUSCULAR

## 2015-09-23 NOTE — Progress Notes (Signed)
Patient tolerated testosterone injection well in [place] without complications.

## 2015-09-23 NOTE — Patient Instructions (Signed)

## 2015-09-23 NOTE — Progress Notes (Signed)
   Subjective:    Patient ID: Elijah Ford, male    DOB: October 14, 1975, 40 y.o.   MRN: [id number]  HPI 3 days of productive cough and nasal congestion. He reports some low-grade temperatures at home. There have been some sick people at work. No ear pain. No sore throat. No GI symptoms. He has had some body aches. Not currently taking medication. The cough is not bothering him at night. He did have pneumonia a year ago.   Preventive medicine-due for testosterone injection today. He is tolerating it well without any problems or side effects.  Review of Systems     Objective:   Physical Exam  Constitutional: He is oriented to person, place, and time. He appears well-developed and well-nourished.  HENT:  Head: Normocephalic and atraumatic.  Right Ear: External ear normal.  Left Ear: External ear normal.  Nose: Nose normal.  Mouth/Throat: Oropharynx is clear and moist.  TMs and canals are clear.   Eyes: Conjunctivae and EOM are normal. Pupils are equal, round, and reactive to light.  Neck: Neck supple. No thyromegaly present.  Cardiovascular: Normal rate and normal heart sounds.   Pulmonary/Chest: Effort normal and breath sounds normal.  Lymphadenopathy:    He has no cervical adenopathy.  Neurological: He is alert and oriented to person, place, and time.  Skin: Skin is warm and dry.  Psychiatric: He has a normal mood and affect.        Assessment & Plan:  Influenza-like illness-flu test was negative. Treat with conservative measures. Call if suddenly worse or not starting to fill least a little better by the end of the week. Work note given. He is not return until he is fever free for 48 hours.  Hypogonadism-testosterone injection given today.

## 2015-09-24 ENCOUNTER — Ambulatory Visit: Payer: 59 | Admitting: Family Medicine

## 2015-09-28 ENCOUNTER — Other Ambulatory Visit: Payer: Self-pay | Admitting: *Deleted

## 2015-09-28 MED ORDER — OMEPRAZOLE 40 MG PO CPDR: 40.0000 mg | DELAYED_RELEASE_CAPSULE | Freq: Every day | ORAL | Status: DC

## 2015-09-28 MED FILL — OMEPRAZOLE DR 40 MG CAPSULE: 40 | 90 days supply | Qty: 90 | Fill #0

## 2015-10-07 ENCOUNTER — Ambulatory Visit (INDEPENDENT_AMBULATORY_CARE_PROVIDER_SITE_OTHER): Payer: 59 | Admitting: Physician Assistant

## 2015-10-07 ENCOUNTER — Telehealth: Payer: Self-pay | Admitting: *Deleted

## 2015-10-07 VITALS — BP 137/95 | HR 85 | Ht >= 80 in | Wt 308.8 lb

## 2015-10-07 DIAGNOSIS — E291 Testicular hypofunction: Secondary | ICD-10-CM

## 2015-10-07 MED ORDER — TESTOSTERONE CYPIONATE 200 MG/ML IM SOLN
200.0000 mg | INTRAMUSCULAR | Status: DC
Start: 2015-10-07 — End: 2015-10-07
  Administered 2015-10-07: 200 mg via INTRAMUSCULAR

## 2015-10-07 MED ORDER — BUPROPION HCL ER (XL) 300 MG PO TB24: 300.0000 mg | ORAL_TABLET | Freq: Every day | ORAL | Status: DC

## 2015-10-07 MED ORDER — FLUOXETINE HCL 40 MG PO CAPS: 40.0000 mg | ORAL_CAPSULE | Freq: Every day | ORAL | Status: DC

## 2015-10-07 NOTE — Telephone Encounter (Signed)
-----   Message from Donella Stade, [place] sent at 10/07/2015  2:36 PM EST ----- Regarding: RE: nurse visit  I sent both meds to restart. Recheck in 2 months in office.  ----- Message -----    From: Laurey Arrow, RN    Sent: 10/07/2015   9:41 AM      To: Donella Stade, PA-C Subject: nurse visit                                    This patient came in today and asked if he could start his Prozac and Wellbutrin again. Also said he isn't taking the phentermine any longer. See chart note.

## 2015-10-07 NOTE — Progress Notes (Signed)
   Subjective:    Patient ID: Elijah Ford, male    DOB: 06-May-1976, 40 y.o.   MRN: [id number]  HPI  Patient here for BP, weight check and testosterone injection.  Denies chest pain, shortness of breath or any other problems.    Review of Systems     Objective:   Physical Exam  BP 137/95 mmHg  Pulse 85  Ht 6\' 8"  (2.032 m)  Wt 308 lb 12.8 oz (140.071 kg)  BMI 33.92 kg/m2       Assessment & Plan:   Patient has lost weight, 5lbs, since last visit.  However he said he isn't taking the phentermine any longer and will be trying the ketogenic diet for his weight loss program.  Patient received the testosterone injection in his left upper outer quadrant, he tolerated the injection well without complications.  Patient made follow up appointment to return in 2 weeks for next injection.   Patient also requested to begin taking the Prozac and Wellbutrin again.  Note will be forwarded to provider for review.

## 2015-10-07 NOTE — Telephone Encounter (Signed)
lmsg for patient to pickup prescriptions.  Will have f/u appt for his testosterone injections and can be seen in 60months

## 2015-10-13 ENCOUNTER — Telehealth: Payer: Self-pay | Admitting: *Deleted

## 2015-10-13 ENCOUNTER — Other Ambulatory Visit: Payer: Self-pay | Admitting: *Deleted

## 2015-10-13 MED ORDER — FLUOXETINE HCL 20 MG PO TABS: 20.0000 mg | ORAL_TABLET | Freq: Every day | ORAL | Status: DC

## 2015-10-13 MED FILL — FLUoxetine HCL 20 MG TABS: 20 | 15 days supply | Qty: 15 | Fill #0

## 2015-10-13 NOTE — Telephone Encounter (Signed)
Pt's wife left vm yesterday stating that since restarting the wellbutrin & prozac again, he woke up night before last violently vomiting and is having joint/muscle aches.  Could this be coming from restarting the high doses since he's not taken either one since Nov? Please advise.

## 2015-10-13 NOTE — Telephone Encounter (Signed)
Maybe but I have never seen anything like that so sudden. Has he had since? Certainly could do half doses for first week and titrate up.

## 2015-10-13 NOTE — Telephone Encounter (Signed)
Pt's wife stated that he didn't take anything last night & woke up today feeling completely normal.  Advised her to have him do half doses for a week.  Prozac was capsules so I sent over #15 of the 20mg .

## 2015-10-21 ENCOUNTER — Ambulatory Visit (INDEPENDENT_AMBULATORY_CARE_PROVIDER_SITE_OTHER): Payer: 59 | Admitting: Physician Assistant

## 2015-10-21 VITALS — BP 128/85 | HR 84 | Resp 16 | Ht >= 80 in | Wt 305.0 lb

## 2015-10-21 DIAGNOSIS — E291 Testicular hypofunction: Secondary | ICD-10-CM | POA: Diagnosis not present

## 2015-10-21 MED ORDER — TESTOSTERONE CYPIONATE 200 MG/ML IM SOLN
200.0000 mg | INTRAMUSCULAR | Status: DC
  Administered 2015-10-21: 200 mg via INTRAMUSCULAR

## 2015-10-21 NOTE — Progress Notes (Signed)
   Subjective:    Patient ID: Elijah Ford, male    DOB: May 21, 1976, 40 y.o.   MRN: [id number]  HPIPatient is here for testosterone injection. Denies chest pain, shortness of breath, headaches or mood changes. States he is doing well on re-start of prozac and wellbutrin.    Review of Systems     Objective:   Physical Exam        Assessment & Plan:  Patient tolerated injection well without complications. Patient advised to schedule next injection in 2 weeks.

## 2015-10-27 ENCOUNTER — Other Ambulatory Visit: Payer: Self-pay | Admitting: *Deleted

## 2015-10-27 MED ORDER — OSELTAMIVIR PHOSPHATE 75 MG PO CAPS: 75.0000 mg | ORAL_CAPSULE | Freq: Every day | ORAL | Status: DC

## 2015-11-04 ENCOUNTER — Ambulatory Visit (INDEPENDENT_AMBULATORY_CARE_PROVIDER_SITE_OTHER): Payer: 59 | Admitting: Physician Assistant

## 2015-11-04 VITALS — BP 142/87 | HR 90

## 2015-11-04 DIAGNOSIS — E291 Testicular hypofunction: Secondary | ICD-10-CM

## 2015-11-04 MED ORDER — TESTOSTERONE CYPIONATE 200 MG/ML IM SOLN
200.0000 mg | Freq: Once | INTRAMUSCULAR | Status: AC
  Administered 2015-11-04: 200 mg via INTRAMUSCULAR

## 2015-11-04 NOTE — Progress Notes (Signed)
   Subjective:    Patient ID: Elijah Ford, male    DOB: 1976-07-15, 40 y.o.   MRN: [id number]  HPI  Elijah Ford is here for a testosterone injection. Denies chest pain, shortness of breath, headaches or mood changes.    Review of Systems     Objective:   Physical Exam        Assessment & Plan:  Patient tolerated injection well without complications. Patient advised to schedule next injection 14 days from today.

## 2015-11-18 ENCOUNTER — Ambulatory Visit: Payer: 59

## 2015-12-02 ENCOUNTER — Ambulatory Visit (INDEPENDENT_AMBULATORY_CARE_PROVIDER_SITE_OTHER): Payer: 59 | Admitting: Sports Medicine

## 2015-12-02 VITALS — BP 131/85 | HR 69 | Resp 18 | Wt 311.4 lb

## 2015-12-02 DIAGNOSIS — E291 Testicular hypofunction: Secondary | ICD-10-CM

## 2015-12-02 DIAGNOSIS — H9202 Otalgia, left ear: Secondary | ICD-10-CM | POA: Diagnosis not present

## 2015-12-02 MED ORDER — TESTOSTERONE CYPIONATE 200 MG/ML IM SOLN
200.0000 mg | Freq: Once | INTRAMUSCULAR | Status: AC
  Administered 2015-12-02: 200 mg via INTRAMUSCULAR

## 2015-12-02 MED ORDER — BUPROPION HCL ER (XL) 300 MG PO TB24: 300.0000 mg | ORAL_TABLET | Freq: Every day | ORAL | Status: DC

## 2015-12-02 MED ORDER — ALLOPURINOL 300 MG PO TABS: ORAL_TABLET | ORAL | Status: DC

## 2015-12-02 MED ORDER — FLUTICASONE PROPIONATE 50 MCG/ACT NA SUSP: NASAL | Status: DC

## 2015-12-02 MED FILL — FLUTICASONE PROP 50 MCG SPR: 50 | 90 days supply | Qty: 48 | Fill #0

## 2015-12-02 NOTE — Assessment & Plan Note (Signed)
Left eustachian tube dysfunction and sinusitis. Neck slight to early consider antibiotics. Adding Flonase, may use over-the-counter cold and flu medication. Call me if no better in one week and consider an antibiotic.

## 2015-12-02 NOTE — Patient Instructions (Signed)

## 2015-12-02 NOTE — Progress Notes (Signed)
  Subjective:    CC: Follow-up  HPI: Left ear pain: Present for a few days, moderate, persistent with radiation to the teeth. No fevers, chills, no cough, shortness of breath, no GI symptoms, no skin rash.  Past medical history, Surgical history, Family history not pertinant except as noted below, Social history, Allergies, and medications have been entered into the medical record, reviewed, and no changes needed.   Review of Systems: No fevers, chills, night sweats, weight loss, chest pain, or shortness of breath.   Objective:    General: Well Developed, well nourished, and in no acute distress.  Neuro: Alert and oriented x3, extra-ocular muscles intact, sensation grossly intact.  HEENT: Normocephalic, atraumatic, pupils equal round reactive to light, neck supple, no masses, Small subcentimeter tender left-sided jugulodigastric lymphadenopathy, thyroid nonpalpable. oropharynx, nasopharynx, ear canals unremarkable.  Skin: Warm and dry, no rashes. Cardiac: Regular rate and rhythm, no murmurs rubs or gallops, no lower extremity edema.  Respiratory: Clear to auscultation bilaterally. Not using accessory muscles, speaking in full sentences.  Impression and Recommendations:    I spent 25 minutes with this patient, greater than 50% was face-to-face time counseling regarding the above diagnoses

## 2015-12-08 MED FILL — BUPROPION HCL XL 300 MG TAB: 300 | 30 days supply | Qty: 30 | Fill #0

## 2015-12-08 MED FILL — ALLOPURINOL 300 MG TABLET: 300 | 90 days supply | Qty: 90 | Fill #0

## 2015-12-16 ENCOUNTER — Ambulatory Visit (INDEPENDENT_AMBULATORY_CARE_PROVIDER_SITE_OTHER): Payer: 59 | Admitting: Physician Assistant

## 2015-12-16 VITALS — BP 133/96 | HR 89 | Wt 317.0 lb

## 2015-12-16 DIAGNOSIS — E669 Obesity, unspecified: Secondary | ICD-10-CM | POA: Diagnosis not present

## 2015-12-16 DIAGNOSIS — R635 Abnormal weight gain: Secondary | ICD-10-CM | POA: Diagnosis not present

## 2015-12-16 DIAGNOSIS — E291 Testicular hypofunction: Secondary | ICD-10-CM

## 2015-12-16 MED ORDER — PHENTERMINE HCL 37.5 MG PO TABS: ORAL_TABLET | ORAL | Status: DC

## 2015-12-16 MED ORDER — TESTOSTERONE CYPIONATE 200 MG/ML IM SOLN
200.0000 mg | Freq: Once | INTRAMUSCULAR | Status: AC
  Administered 2015-12-16: 200 mg via INTRAMUSCULAR

## 2015-12-16 NOTE — Progress Notes (Signed)
Patient came into office today for testosterone injection. Denies chest pain, shortness of breath, headaches and problems associated with taking this medication. Patient states he has had no abnornal mood swings. Patient does mention he would like to restart Phentermine, advised if PCP sends over new Rx he will need to do monthly weight checks. Verbalized understanding. Patient tolerated injection in [place] well without complications. Patient advised to schedule his next injection for 2 weeks from today.

## 2015-12-30 ENCOUNTER — Ambulatory Visit: Payer: 59

## 2015-12-31 ENCOUNTER — Other Ambulatory Visit: Payer: Self-pay | Admitting: *Deleted

## 2015-12-31 ENCOUNTER — Ambulatory Visit (INDEPENDENT_AMBULATORY_CARE_PROVIDER_SITE_OTHER): Payer: 59 | Admitting: Family Medicine

## 2015-12-31 VITALS — BP 129/85 | HR 88 | Wt 303.0 lb

## 2015-12-31 DIAGNOSIS — E291 Testicular hypofunction: Secondary | ICD-10-CM

## 2015-12-31 IMAGING — CR DG CERVICAL SPINE COMPLETE 4+V
7 series · 7 of 7 positions shown · non-contrast
Comparison: None.

CLINICAL DATA: Neck pain, limited range of motion, no recent injury

EXAM:
CERVICAL SPINE  4+ VIEWS

[view not recorded (1 of 7)]
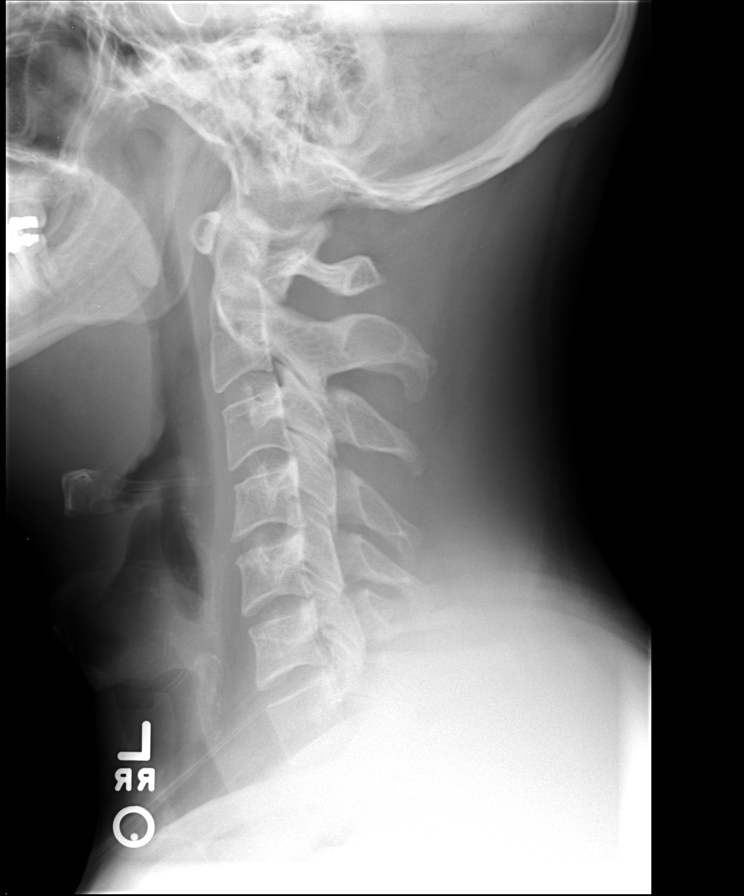

[view not recorded (2 of 7)]
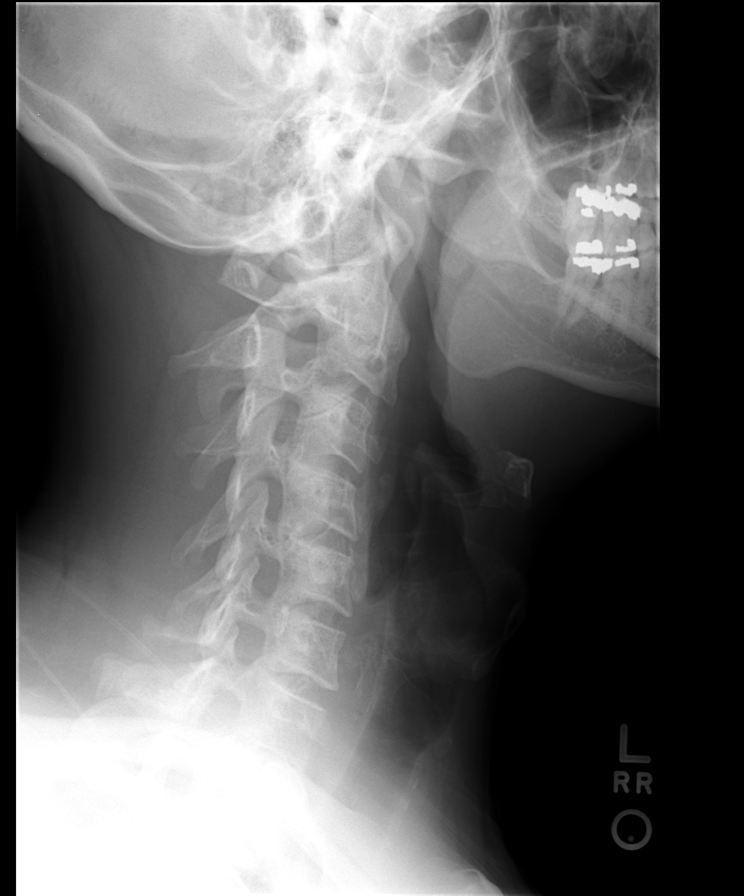

[view not recorded (3 of 7)]
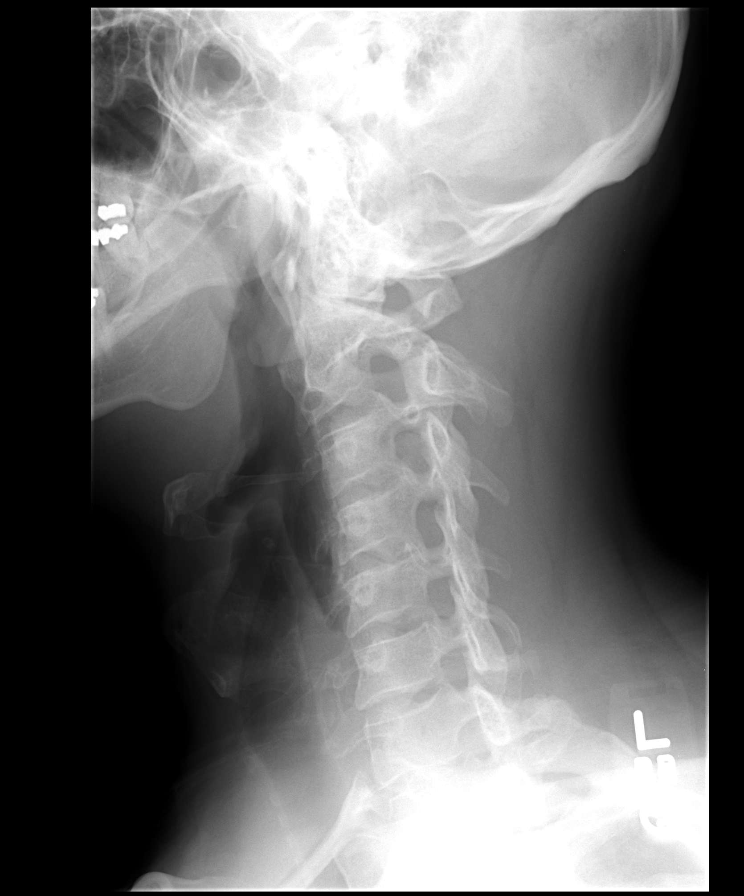

[view not recorded (4 of 7)]
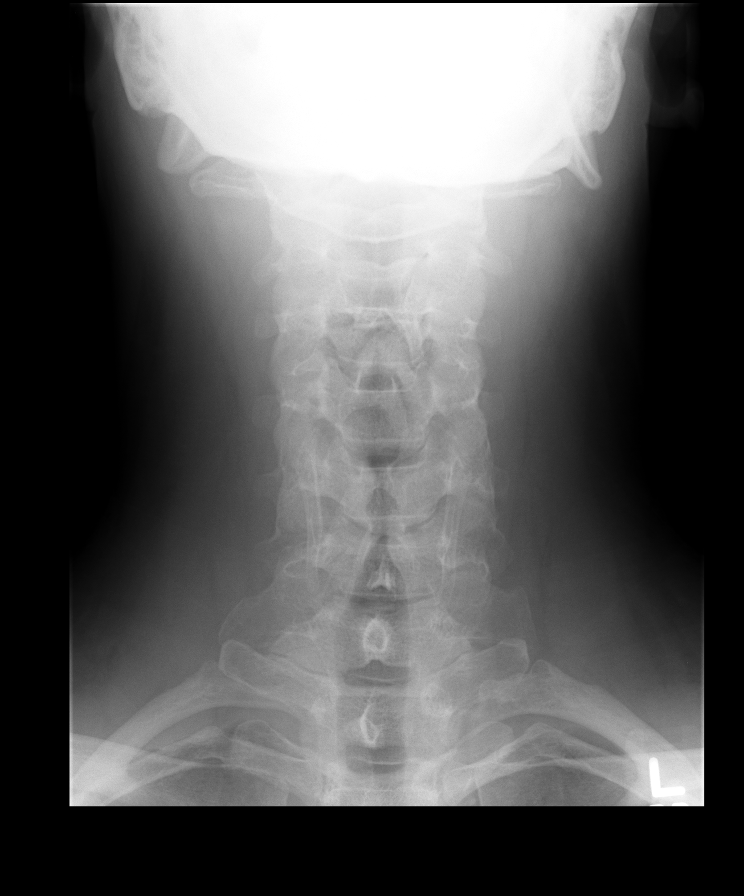

[view not recorded (5 of 7)]
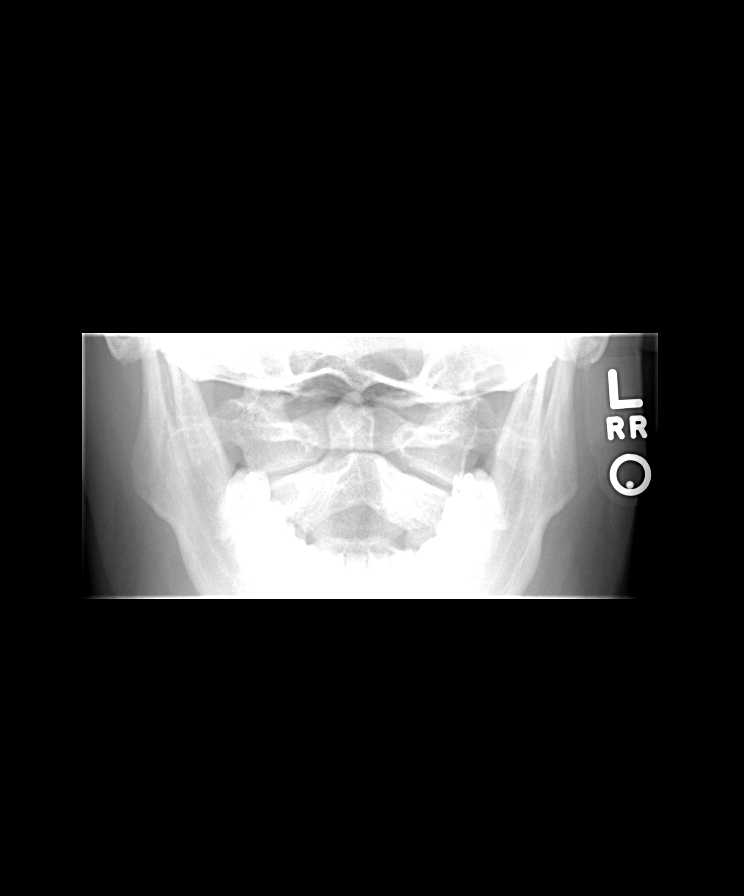

[view not recorded (6 of 7)]
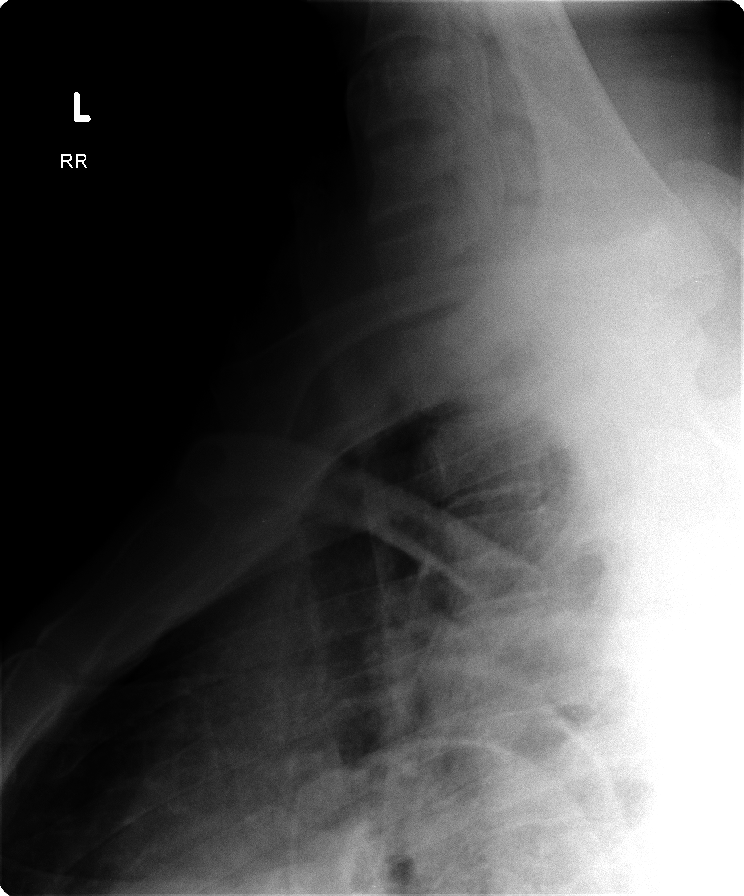

[view not recorded (7 of 7)]
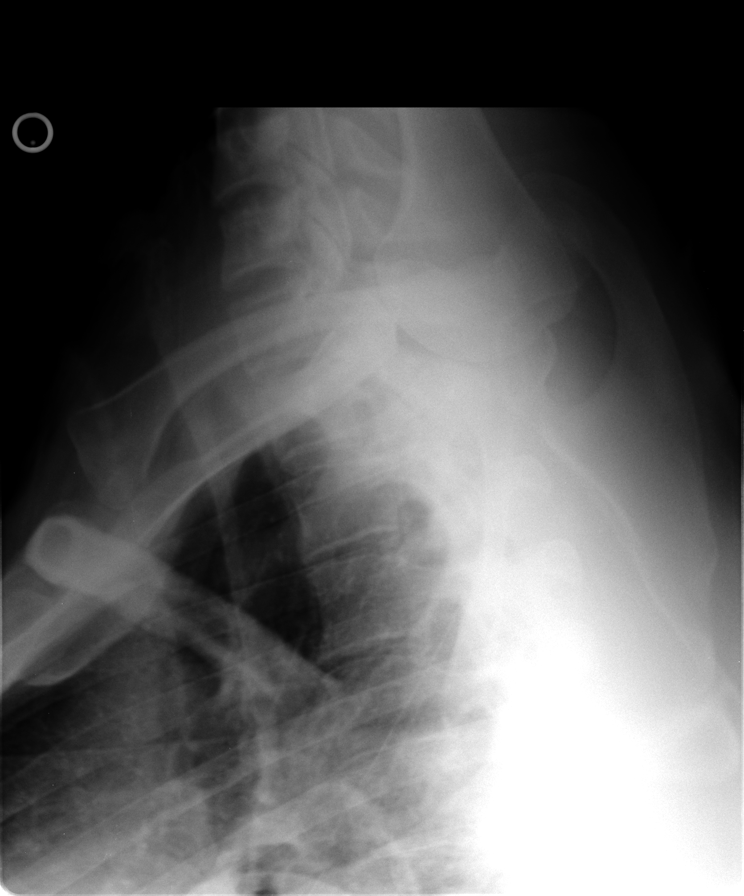

[7 of 7 positions shown; findings below may reference images not displayed]

FINDINGS: The cervical vertebrae are straightened in alignment. Intervertebral
disc spaces appear normal. No prevertebral soft tissue swelling is
seen. On oblique views, the foramina are patent. The odontoid
process is intact. The lung apices are clear.
IMPRESSION: Straightened alignment.  Normal intervertebral disc spaces.

## 2015-12-31 MED ORDER — BUPROPION HCL ER (XL) 300 MG PO TB24: 300.0000 mg | ORAL_TABLET | Freq: Every day | ORAL | Status: DC

## 2015-12-31 MED ORDER — TESTOSTERONE CYPIONATE 200 MG/ML IM SOLN
200.0000 mg | Freq: Once | INTRAMUSCULAR | Status: AC
  Administered 2015-12-31: 200 mg via INTRAMUSCULAR

## 2015-12-31 MED ORDER — FLUOXETINE HCL 40 MG PO CAPS: 40.0000 mg | ORAL_CAPSULE | Freq: Every day | ORAL | Status: DC

## 2015-12-31 MED FILL — BUPROPION HCL XL 300 MG TAB: 300 | 90 days supply | Qty: 90 | Fill #0

## 2015-12-31 MED FILL — FLUoxetine HCL 40 MG CAPS: 40 | 90 days supply | Qty: 90 | Fill #0

## 2015-12-31 NOTE — Progress Notes (Signed)
   Subjective:    Patient ID: Elijah Ford, male    DOB: 08/25/1976, 40 y.o.   MRN: [id number] Pt in for testosterone injection.  Given RUOQ without complications.  Beatris Ship, CMA HPI    Review of Systems     Objective:   Physical Exam        Assessment & Plan:

## 2016-01-01 MED FILL — OMEPRAZOLE DR 40 MG CAPSULE: 40 | 90 days supply | Qty: 90 | Fill #1

## 2016-01-13 ENCOUNTER — Ambulatory Visit: Payer: 59

## 2016-01-18 ENCOUNTER — Ambulatory Visit (INDEPENDENT_AMBULATORY_CARE_PROVIDER_SITE_OTHER): Payer: 59 | Admitting: Physician Assistant

## 2016-01-18 VITALS — BP 128/66 | HR 71 | Wt 297.0 lb

## 2016-01-18 DIAGNOSIS — E291 Testicular hypofunction: Secondary | ICD-10-CM | POA: Diagnosis not present

## 2016-01-18 MED ORDER — TESTOSTERONE CYPIONATE 200 MG/ML IM SOLN
200.0000 mg | Freq: Once | INTRAMUSCULAR | Status: AC
  Administered 2016-01-18: 200 mg via INTRAMUSCULAR

## 2016-01-18 NOTE — Progress Notes (Signed)
Patient came into office today for testosterone injection. Denies chest pain, shortness of breath, headaches and problems associated with taking this medication. Patient states he has had no abnornal mood swings. Patient tolerated injection in [place] well without complications. Patient advised to schedule his next injection for 2 weeks from today.

## 2016-01-27 ENCOUNTER — Ambulatory Visit (INDEPENDENT_AMBULATORY_CARE_PROVIDER_SITE_OTHER): Payer: 59 | Admitting: Physician Assistant

## 2016-01-27 ENCOUNTER — Encounter: Payer: Self-pay | Admitting: Physician Assistant

## 2016-01-27 VITALS — BP 121/69 | HR 79 | Ht >= 80 in | Wt 301.0 lb

## 2016-01-27 DIAGNOSIS — J45901 Unspecified asthma with (acute) exacerbation: Secondary | ICD-10-CM | POA: Diagnosis not present

## 2016-01-27 DIAGNOSIS — R062 Wheezing: Secondary | ICD-10-CM | POA: Diagnosis not present

## 2016-01-27 DIAGNOSIS — H6691 Otitis media, unspecified, right ear: Secondary | ICD-10-CM | POA: Diagnosis not present

## 2016-01-27 DIAGNOSIS — J0140 Acute pansinusitis, unspecified: Secondary | ICD-10-CM

## 2016-01-27 MED ORDER — IPRATROPIUM-ALBUTEROL 0.5-2.5 (3) MG/3ML IN SOLN: 3.0000 mL | RESPIRATORY_TRACT | Status: DC | PRN

## 2016-01-27 MED ORDER — LEVOFLOXACIN 500 MG PO TABS: 500.0000 mg | ORAL_TABLET | Freq: Every day | ORAL | Status: DC

## 2016-01-27 MED ORDER — IPRATROPIUM-ALBUTEROL 0.5-2.5 (3) MG/3ML IN SOLN
3.0000 mL | Freq: Once | RESPIRATORY_TRACT | Status: AC
  Administered 2016-01-27: 3 mL via RESPIRATORY_TRACT

## 2016-01-27 MED ORDER — ALBUTEROL SULFATE HFA 108 (90 BASE) MCG/ACT IN AERS: 2.0000 | INHALATION_SPRAY | Freq: Four times a day (QID) | RESPIRATORY_TRACT | Status: DC | PRN

## 2016-01-27 MED ORDER — PREDNISONE 50 MG PO TABS: ORAL_TABLET | ORAL | Status: DC

## 2016-01-27 MED ORDER — METHYLPREDNISOLONE SODIUM SUCC 125 MG IJ SOLR
125.0000 mg | Freq: Once | INTRAMUSCULAR | Status: AC
  Administered 2016-01-27: 125 mg via INTRAMUSCULAR

## 2016-01-27 MED FILL — PROAIR HFA 90 MCG INHALER: 108 (90 BAS | 30 days supply | Qty: 9 | Fill #0

## 2016-01-27 MED FILL — levoFLOXacin 500 MG TABS: 500 | 10 days supply | Qty: 10 | Fill #0

## 2016-01-27 MED FILL — predniSONE 50 MG TABS: 50 | 5 days supply | Qty: 5 | Fill #0

## 2016-01-27 NOTE — Progress Notes (Addendum)
   Subjective:    Patient ID: Elijah Ford, male    DOB: [birth year], 40 y.o.   MRN: [id number]  HPI Patient is here today complaining of productive cough for the past 7 days. He has also had congestion, headaches, and sinus pressure. He reports chest tightness, shortness of breath, and wheezing. He denies sore throat and fever. He has been taking Mucinex, Dayquil, and Nyquil with no relief. He does have a nebulizer at home but has not used it.   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 He believes this may have triggered some of his allergies and worsened his symptoms and cough. He does take Zyrtec, Claritin, and Flonase daily.   Review of Systems  Constitutional: Negative for chills.  HENT: Positive for congestion and sinus pressure. Negative for ear pain, sore throat, trouble swallowing and voice change.   Respiratory: Positive for cough, chest tightness, shortness of breath and wheezing.   Neurological: Positive for light-headedness (Due to shortness of breath) and headaches.  All other systems reviewed and are negative.      Objective:   Physical Exam  Constitutional: He is oriented to person, place, and time. He appears well-developed and well-nourished.  HENT:  Head: Normocephalic and atraumatic.  Right Ear: Tympanic membrane is erythematous and bulging. Tympanic membrane is not retracted.  Left Ear: Tympanic membrane is not erythematous, not retracted and not bulging.  Nose: Right sinus exhibits maxillary sinus tenderness and frontal sinus tenderness. Left sinus exhibits maxillary sinus tenderness and frontal sinus tenderness.  Mouth/Throat: Posterior oropharyngeal erythema present.  Eyes: Conjunctivae are normal. Right eye exhibits no discharge. Left eye exhibits no discharge.  Cardiovascular: Normal rate, regular rhythm and normal heart sounds.   Pulmonary/Chest: Effort normal. He has wheezes (Throughout all lung fields).   Lymphadenopathy:    He has no cervical adenopathy.  Neurological: He is alert and oriented to person, place, and time.  Skin: Skin is warm and dry.  Psychiatric: He has a normal mood and affect. His behavior is normal. Thought content normal.          Assessment & Plan:  1. Asthmatic Bronchitis/Wheezing- Patient reports wheezing, shortness of breath, and chest tightness with a cough for the past 7 days. Pulse ox today 98%. On PE, there were wheezes and rales in all lung fields. Duoneb given in office. Solumedrol 125 mg given IM. I am also going to send Prednisone for him to start tomorrow. Nebulizer solution of duoneb refilled- should do every 2-4 hours today then as needed. I am also going to send an Albuterol inhaler for him to use PRN. Treating with Levoquin.   2. Acute Bacterial Pansinusitis- Patient reports headaches, sinus pressure, and congestion for the past 7 days. On PE, he is tender to palpation of both the maxillary and frontal sinuses. I am sending Levoquin. Recommend patient continue symptomatic treatment with Mucinex and Tylenol.   3. Acute Otitis Media of the Right ear- On PE, right TM is erythematous and bulging. I am sending Levoquin.

## 2016-02-01 ENCOUNTER — Ambulatory Visit (INDEPENDENT_AMBULATORY_CARE_PROVIDER_SITE_OTHER): Payer: 59 | Admitting: Family Medicine

## 2016-02-01 VITALS — BP 136/80 | HR 67

## 2016-02-01 DIAGNOSIS — E291 Testicular hypofunction: Secondary | ICD-10-CM

## 2016-02-01 MED ORDER — TESTOSTERONE CYPIONATE 200 MG/ML IM SOLN
200.0000 mg | Freq: Once | INTRAMUSCULAR | Status: AC
  Administered 2016-02-01: 200 mg via INTRAMUSCULAR

## 2016-02-01 NOTE — Progress Notes (Signed)
   Subjective:    Patient ID: Elijah Ford, male    DOB: May 05, 1976, 40 y.o.   MRN: [id number] Pt in today for testosterone injection.  Given RUOQ with no complications.  Pt denies any side effects.  Beatris Ship, CMA HPI    Review of Systems     Objective:   Physical Exam        Assessment & Plan:  Agree with above. It has been 6 months. He is due for testosterone, CBC and hepatic function.  Also due for cholesterol  Beatrice Lecher, MD

## 2016-02-18 ENCOUNTER — Ambulatory Visit (INDEPENDENT_AMBULATORY_CARE_PROVIDER_SITE_OTHER): Payer: 59 | Admitting: Family Medicine

## 2016-02-18 VITALS — BP 135/81 | HR 72

## 2016-02-18 DIAGNOSIS — E291 Testicular hypofunction: Secondary | ICD-10-CM

## 2016-02-18 MED ORDER — TESTOSTERONE CYPIONATE 200 MG/ML IM SOLN
200.0000 mg | Freq: Once | INTRAMUSCULAR | Status: AC
  Administered 2016-02-18: 200 mg via INTRAMUSCULAR

## 2016-02-18 NOTE — Progress Notes (Signed)
   Subjective:    Patient ID: Elijah Ford, male    DOB: 1976/09/02, 40 y.o.   MRN: [id number] Pt given 284mL of testosterone in [place].  No complications.  Elijah Ford, CMA HPI    Review of Systems     Objective:   Physical Exam        Assessment & Plan:  Hypogonadism-patient tolerated well. Due for CBC, Hepatic function, PSA, and testosterone level. Have drawn one week after injection  Elijah Lecher, MD

## 2016-02-29 MED FILL — MELOXICAM 15 MG TABLET: 15 | 30 days supply | Qty: 30 | Fill #2

## 2016-03-02 ENCOUNTER — Ambulatory Visit: Payer: 59

## 2016-03-08 ENCOUNTER — Other Ambulatory Visit: Payer: Self-pay | Admitting: Sports Medicine

## 2016-03-08 MED FILL — ALLOPURINOL 300 MG TABLET: 300 | 90 days supply | Qty: 90 | Fill #0

## 2016-03-09 ENCOUNTER — Encounter: Payer: Self-pay | Admitting: Sports Medicine

## 2016-03-09 ENCOUNTER — Ambulatory Visit (INDEPENDENT_AMBULATORY_CARE_PROVIDER_SITE_OTHER): Payer: 59 | Admitting: Sports Medicine

## 2016-03-09 VITALS — BP 118/78 | HR 71 | Resp 18 | Wt 311.4 lb

## 2016-03-09 DIAGNOSIS — M722 Plantar fascial fibromatosis: Secondary | ICD-10-CM

## 2016-03-09 DIAGNOSIS — E291 Testicular hypofunction: Secondary | ICD-10-CM | POA: Diagnosis not present

## 2016-03-09 MED ORDER — TESTOSTERONE CYPIONATE 200 MG/ML IM SOLN
200.0000 mg | Freq: Once | INTRAMUSCULAR | Status: AC
  Administered 2016-03-09: 200 mg via INTRAMUSCULAR

## 2016-03-09 NOTE — Assessment & Plan Note (Signed)
This represents classic plantar fasciitis, continue custom orthotics, injection as above.  Rehabilitation exercises given, return to see me in one month.

## 2016-03-09 NOTE — Progress Notes (Signed)
   Subjective:    I'm seeing this patient as a consultation for:  Iran Planas, PA-C  CC: Left heel pain  HPI:  For several weeks this pleasant 40 year old male has had severe and worsening pain worse in the morning with the first few steps on the plantar aspect of his left heel. No radiation, no trauma. Symptoms are severe, worsening. Has tried NSAIDs without any improvement, already has custom orthotics.    Past medical history, Surgical history, Family history not pertinant except as noted below, Social history, Allergies, and medications have been entered into the medical record, reviewed, and no changes needed.   Review of Systems: No headache, visual changes, nausea, vomiting, diarrhea, constipation, dizziness, abdominal pain, skin rash, fevers, chills, night sweats, weight loss, swollen lymph nodes, body aches, joint swelling, muscle aches, chest pain, shortness of breath, mood changes, visual or auditory hallucinations.   Objective:   General: Well Developed, well nourished, and in no acute distress.  Neuro/Psych: Alert and oriented x3, extra-ocular muscles intact, able to move all 4 extremities, sensation grossly intact. Skin: Warm and dry, no rashes noted.  Respiratory: Not using accessory muscles, speaking in full sentences, trachea midline.  Cardiovascular: Pulses palpable, no extremity edema. Abdomen: Does not appear distended. Left Foot: No visible erythema or swelling. Range of motion is full in all directions. Strength is 5/5 in all directions. No hallux valgus. No pes cavus or pes planus. No abnormal callus noted. No pain over the navicular prominence, or base of fifth metatarsal. Tender to palpation of the calcaneal insertion of plantar fascia. No pain at the Achilles insertion. No pain over the calcaneal bursa. No pain of the retrocalcaneal bursa. No tenderness to palpation over the tarsals, metatarsals, or phalanges. No hallux rigidus or limitus. No tenderness  palpation over interphalangeal joints. No pain with compression of the metatarsal heads. Neurovascularly intact distally.  Procedure: Real-time Ultrasound Guided Injection of left plantar fascia Device: GE Logiq E  Verbal informed consent obtained.  Time-out conducted.  Noted no overlying erythema, induration, or other signs of local infection.  Skin prepped in a sterile fashion.  Local anesthesia: Topical Ethyl chloride.  With sterile technique and under real time ultrasound guidance:  25-gauge needle advanced just deep to the plantar fascia is insertion on the calcaneus, 1 mL kenalog 40, 1 mL lidocaine, 1 mL Marcaine injected easily Completed without difficulty  Pain immediately resolved suggesting accurate placement of the medication.  Advised to call if fevers/chills, erythema, induration, drainage, or persistent bleeding.  Images permanently stored and available for review in the ultrasound unit.  Impression: Technically successful ultrasound guided injection.  Impression and Recommendations:   This case required medical decision making of moderate complexity.

## 2016-03-09 NOTE — Addendum Note (Signed)
Addended by: Elizabeth Sauer on: 03/09/2016 10:44 AM   Modules accepted: Orders

## 2016-03-17 ENCOUNTER — Telehealth: Payer: Self-pay | Admitting: *Deleted

## 2016-03-17 NOTE — Addendum Note (Signed)
Addended by: Beatris Ship L on: 03/17/2016 09:18 AM   Modules accepted: Orders

## 2016-03-17 NOTE — Telephone Encounter (Signed)
LMOM notifying pt to have labs drawn.

## 2016-03-23 ENCOUNTER — Ambulatory Visit (INDEPENDENT_AMBULATORY_CARE_PROVIDER_SITE_OTHER): Payer: 59 | Admitting: Physician Assistant

## 2016-03-23 ENCOUNTER — Telehealth: Payer: Self-pay

## 2016-03-23 VITALS — BP 120/82 | HR 81 | Wt 311.0 lb

## 2016-03-23 DIAGNOSIS — E291 Testicular hypofunction: Secondary | ICD-10-CM | POA: Diagnosis not present

## 2016-03-23 MED ORDER — TESTOSTERONE CYPIONATE 200 MG/ML IM SOLN
200.0000 mg | INTRAMUSCULAR | Status: DC
  Administered 2016-03-23: 200 mg via INTRAMUSCULAR

## 2016-03-23 NOTE — Progress Notes (Signed)
   Subjective:    Patient ID: Elijah Ford, male    DOB: 06/04/1976, 40 y.o.   MRN: [id number]  HPI Pt is here for testosterone injection.  Denies chest pain, SOB, HA, mood changes, and problems with injections.     Review of Systems     Objective:   Physical Exam        Assessment & Plan:  Pt tolerated injection well and without complications.  Pt advised to schedule appointment in 14 days.

## 2016-03-25 ENCOUNTER — Other Ambulatory Visit: Payer: Self-pay | Admitting: Physician Assistant

## 2016-03-25 MED FILL — OMEPRAZOLE DR 40 MG CAPSULE: 40 | 90 days supply | Qty: 90 | Fill #2

## 2016-03-25 MED FILL — FLUoxetine HCL 40 MG CAPS: 40 | 90 days supply | Qty: 90 | Fill #0

## 2016-04-02 LAB — HEPATIC FUNCTION PANEL
ALBUMIN: 4.6 g/dL (ref 3.6–5.1)
ALT: 16 U/L (ref 9–46)
AST: 20 U/L (ref 10–40)
Alkaline Phosphatase: 51 U/L (ref 40–115)
BILIRUBIN INDIRECT: 0.6 mg/dL (ref 0.2–1.2)
Bilirubin, Direct: 0.1 mg/dL (ref <=0.2)
TOTAL PROTEIN: 6.6 g/dL (ref 6.1–8.1)
Total Bilirubin: 0.7 mg/dL (ref 0.2–1.2)

## 2016-04-02 LAB — CBC WITH DIFFERENTIAL/PLATELET
BASOS PCT: 0 %
Basophils Absolute: 0 cells/uL (ref 0–200)
Eosinophils Absolute: 345 cells/uL (ref 15–500)
Eosinophils Relative: 5 %
HEMATOCRIT: 51.4 % — AB (ref 38.5–50.0)
HEMOGLOBIN: 17.8 g/dL — AB (ref 13.2–17.1)
LYMPHS ABS: 1794 {cells}/uL (ref 850–3900)
Lymphocytes Relative: 26 %
MCH: 30.6 pg (ref 27.0–33.0)
MCHC: 34.6 g/dL (ref 32.0–36.0)
MCV: 88.5 fL (ref 80.0–100.0)
MONO ABS: 345 {cells}/uL (ref 200–950)
MPV: 9.1 fL (ref 7.5–12.5)
Monocytes Relative: 5 %
Neutro Abs: 4416 cells/uL (ref 1500–7800)
Neutrophils Relative %: 64 %
Platelets: 226 10*3/uL (ref 140–400)
RBC: 5.81 MIL/uL — AB (ref 4.20–5.80)
RDW: 15.1 % — ABNORMAL HIGH (ref 11.0–15.0)
WBC: 6.9 10*3/uL (ref 3.8–10.8)

## 2016-04-02 LAB — PSA: PSA: 0.64 ng/mL (ref <=4.00)

## 2016-04-02 LAB — TESTOSTERONE: TESTOSTERONE: 264 ng/dL (ref 250–827)

## 2016-04-05 ENCOUNTER — Telehealth: Payer: Self-pay | Admitting: *Deleted

## 2016-04-05 DIAGNOSIS — R899 Unspecified abnormal finding in specimens from other organs, systems and tissues: Secondary | ICD-10-CM

## 2016-04-05 DIAGNOSIS — E291 Testicular hypofunction: Secondary | ICD-10-CM

## 2016-04-05 NOTE — Telephone Encounter (Signed)
Labs ordered.

## 2016-04-06 ENCOUNTER — Ambulatory Visit (INDEPENDENT_AMBULATORY_CARE_PROVIDER_SITE_OTHER): Payer: 59 | Admitting: Physician Assistant

## 2016-04-06 VITALS — BP 137/85 | HR 69 | Wt 312.0 lb

## 2016-04-06 DIAGNOSIS — E291 Testicular hypofunction: Secondary | ICD-10-CM

## 2016-04-06 DIAGNOSIS — R718 Other abnormality of red blood cells: Secondary | ICD-10-CM

## 2016-04-06 MED ORDER — TESTOSTERONE CYPIONATE 200 MG/ML IM SOLN
200.0000 mg | Freq: Once | INTRAMUSCULAR | Status: AC
  Administered 2016-04-06: 200 mg via INTRAMUSCULAR

## 2016-04-06 NOTE — Progress Notes (Signed)
Patient came into office today for testosterone injection. Denies chest pain, shortness of breath, headaches and problems associated with taking this medication. Patient states he has had no abnornal mood swings. Patient tolerated injection in [place] well without complications. Patient advised to schedule his next injection for 2 weeks from today.  Elevated hematocrit on last labs. Will repeat labs again. Continue testosterone injection. Testosterone remains low. Jade breeback PA-C

## 2016-04-13 LAB — CBC WITH DIFFERENTIAL/PLATELET
BASOS ABS: 65 {cells}/uL (ref 0–200)
Basophils Relative: 1 %
EOS ABS: 195 {cells}/uL (ref 15–500)
Eosinophils Relative: 3 %
HCT: 49.7 % (ref 38.5–50.0)
Hemoglobin: 17.3 g/dL — ABNORMAL HIGH (ref 13.2–17.1)
Lymphocytes Relative: 27 %
Lymphs Abs: 1755 cells/uL (ref 850–3900)
MCH: 31.1 pg (ref 27.0–33.0)
MCHC: 34.8 g/dL (ref 32.0–36.0)
MCV: 89.2 fL (ref 80.0–100.0)
MONOS PCT: 8 %
MPV: 9.1 fL (ref 7.5–12.5)
Monocytes Absolute: 520 cells/uL (ref 200–950)
NEUTROS ABS: 3965 {cells}/uL (ref 1500–7800)
Neutrophils Relative %: 61 %
PLATELETS: 207 10*3/uL (ref 140–400)
RBC: 5.57 MIL/uL (ref 4.20–5.80)
RDW: 15.1 % — ABNORMAL HIGH (ref 11.0–15.0)
WBC: 6.5 10*3/uL (ref 3.8–10.8)

## 2016-04-14 LAB — TESTOSTERONE: TESTOSTERONE: 393 ng/dL (ref 250–827)

## 2016-04-20 ENCOUNTER — Ambulatory Visit: Payer: 59

## 2016-05-10 ENCOUNTER — Ambulatory Visit (INDEPENDENT_AMBULATORY_CARE_PROVIDER_SITE_OTHER): Payer: 59 | Admitting: Physician Assistant

## 2016-05-10 VITALS — BP 139/88 | HR 77 | Wt 315.0 lb

## 2016-05-10 DIAGNOSIS — E291 Testicular hypofunction: Secondary | ICD-10-CM | POA: Diagnosis not present

## 2016-05-10 MED ORDER — TESTOSTERONE CYPIONATE 200 MG/ML IM SOLN
200.0000 mg | Freq: Once | INTRAMUSCULAR | Status: AC
  Administered 2016-05-10: 200 mg via INTRAMUSCULAR

## 2016-05-10 NOTE — Progress Notes (Signed)
Patient came into office today for testosterone injection. Denies chest pain, shortness of breath, headaches and problems associated with taking this medication. Patient states he has had no abnornal mood swings. He did miss his last appointment do to increased workload at his job, plans to get back on track for injections every 2 weeks. Patient tolerated injection in [place] well without complications. Patient advised to schedule his next injection for 2 weeks from today.

## 2016-05-27 MED FILL — BUPROPION HCL XL 300 MG TAB: 300 | 30 days supply | Qty: 30 | Fill #1

## 2016-06-01 ENCOUNTER — Ambulatory Visit (INDEPENDENT_AMBULATORY_CARE_PROVIDER_SITE_OTHER): Payer: 59 | Admitting: Physician Assistant

## 2016-06-01 ENCOUNTER — Encounter: Payer: Self-pay | Admitting: Physician Assistant

## 2016-06-01 VITALS — BP 121/83 | HR 73 | Ht >= 80 in | Wt 313.0 lb

## 2016-06-01 DIAGNOSIS — K439 Ventral hernia without obstruction or gangrene: Secondary | ICD-10-CM

## 2016-06-01 DIAGNOSIS — E291 Testicular hypofunction: Secondary | ICD-10-CM

## 2016-06-01 DIAGNOSIS — Z23 Encounter for immunization: Secondary | ICD-10-CM

## 2016-06-01 MED ORDER — FLUOXETINE HCL 40 MG PO CAPS: ORAL_CAPSULE | ORAL | 1 refills | Status: DC

## 2016-06-01 MED ORDER — HYDROXYZINE PAMOATE 50 MG PO CAPS: 50.0000 mg | ORAL_CAPSULE | Freq: Three times a day (TID) | ORAL | 1 refills | Status: DC | PRN

## 2016-06-01 MED ORDER — BUPROPION HCL ER (XL) 300 MG PO TB24: 300.0000 mg | ORAL_TABLET | Freq: Every day | ORAL | 1 refills | Status: DC

## 2016-06-01 MED ORDER — MELOXICAM 15 MG PO TABS: 15.0000 mg | ORAL_TABLET | Freq: Every day | ORAL | 3 refills | Status: DC

## 2016-06-01 MED ORDER — ALLOPURINOL 300 MG PO TABS: ORAL_TABLET | ORAL | 1 refills | Status: DC

## 2016-06-01 MED ORDER — TESTOSTERONE CYPIONATE 200 MG/ML IM SOLN
200.0000 mg | Freq: Once | INTRAMUSCULAR | Status: AC
  Administered 2016-06-01: 200 mg via INTRAMUSCULAR

## 2016-06-01 MED FILL — FLUoxetine HCL 40 MG CAPS: 40 | 90 days supply | Qty: 90 | Fill #0

## 2016-06-01 MED FILL — MELOXICAM 15 MG TABLET: 15 | 30 days supply | Qty: 30 | Fill #0

## 2016-06-01 MED FILL — HYDROXYZINE PAM 50 MG CAP: 50 | 30 days supply | Qty: 90 | Fill #0

## 2016-06-01 MED FILL — ALLOPURINOL 300 MG TABLET: 300 | 90 days supply | Qty: 90 | Fill #0

## 2016-06-01 NOTE — Patient Instructions (Signed)
Laparoscopic Ventral Hernia Repair °Laparoscopic ventral hernia repair is a surgery to fix a ventral hernia. A ventral hernia, also called an incisional hernia, is a bulge of body tissue or intestines that pushes through the front part of the abdomen. This can happen if the connective tissue covering the muscles over the abdomen has a weak spot or is torn because of a surgical cut (incision) from a previous surgery. Laparoscopic ventral hernia repair is often done soon after diagnosis to stop the hernia from getting bigger, becoming uncomfortable, or becoming an emergency. This surgery usually takes about 2 hours, but the time can vary greatly. °LET YOUR HEALTH CARE PROVIDER KNOW ABOUT: °· Any allergies you have. °· All medicines you are taking, including steroids, vitamins, herbs, eye drops, creams, and over-the-counter medicines. °· Previous problems you or members of your family have had with the use of anesthetics. °· Any blood disorders you have. °· Previous surgeries you have had. °· Medical conditions you have. °RISKS AND COMPLICATIONS  °Generally, laparoscopic ventral hernia repair is a safe procedure. However, as with any surgical procedure, problems can occur. Possible problems include: °· Bleeding. °· Trouble passing urine or having a bowel movement after the surgery. °· Infection. °· Pneumonia. °· Blood clots. °· Pain in the area of the hernia. °· A bulge in the area of the hernia that may be caused by a collection of fluid. °· Injury to intestines or other structures in the abdomen. °· Return of the hernia after surgery. °In some cases, your health care provider may need to stop the laparoscopic procedure and do regular, open surgery. This may be necessary for very difficult hernias, when organs are hard to see, or when bleeding problems occur during surgery. °BEFORE THE PROCEDURE  °· You may need to have blood tests, urine tests, a chest X-ray, or an electrocardiogram done before the day of the  surgery. °· Ask your health care provider about changing or stopping your regular medicines. This is especially important if you are taking diabetes medicines or blood thinners. °· You may need to wash with a special type of germ-killing soap. °· Do not eat or drink anything after midnight the night before the procedure or as directed by your health care provider. °· Make plans to have someone drive you home after the procedure. °PROCEDURE  °· Small monitors will be put on your body. They are used to check your heart, blood pressure, and oxygen level. °· An IV access tube will be put into a vein in your hand or arm. Fluids and medicine will flow directly into your body through the IV tube. °· You will be given medicine that makes you go to sleep (general anesthetic). °· Your abdomen will be cleaned with a special soap to kill any germs on your skin. °· Once you are asleep, several small incisions will be made in your abdomen. °· The large space in your abdomen will be filled with air so that it expands. This gives your health care provider more room and a better view. °· A thin, lighted tube with a tiny camera on the end (laparoscope) is put through a small incision in your abdomen. The camera on the laparoscope sends a picture to a TV screen in the operating room. This gives your health care provider a good view inside your abdomen. °· Hollow tubes are put through the other small incisions in your abdomen. The tools needed for the procedure are put through these tubes. °· Your health care provider   puts the tissue or intestines that formed the hernia back in place.  A screen-like patch (mesh) is used to close the hernia. This helps make the area stronger. Stitches, tacks, or staples are used to keep the mesh in place.  Medicine and a bandage (dressing) or skin glue will be put over the incisions. AFTER THE PROCEDURE   You will stay in a recovery area until the anesthetic wears off. Your blood pressure and  pulse will be checked often.  You may be able to go home the same day or may need to stay in the hospital for 1-2 days after surgery. Your health care provider will decide when you can go home.  You may feel some pain. You may be given medicine for pain.  You will be urged to do breathing exercises that involve taking deep breaths. This helps prevent a lung infection after a surgery.  You may have to wear compression stockings while you are in the hospital. These stockings help keep blood clots from forming in your legs.   This information is not intended to replace advice given to you by your health care provider. Make sure you discuss any questions you have with your health care provider.   Document Released: 08/22/2012 Document Revised: 09/10/2013 Document Reviewed: 08/22/2012 Elsevier Interactive Patient Education 2016 [employer] Ventral Hernia A ventral hernia (also called an incisional hernia) is a hernia that occurs at the site of a previous surgical cut (incision) in the abdomen. The abdominal wall spans from your lower chest down to your pelvis. If the abdominal wall is weakened from a surgical incision, a hernia can occur. A hernia is a bulge of bowel or muscle tissue pushing out on the weakened part of the abdominal wall. Ventral hernias can get bigger from straining or lifting. Obese and older people are at higher risk for a ventral hernia. People who develop infections after surgery or require repeat incisions at the same site on the abdomen are also at increased risk. CAUSES  A ventral hernia occurs because of weakness in the abdominal wall at an incision site.  SYMPTOMS  Common symptoms include:  A visible bulge or lump on the abdominal wall.  Pain or tenderness around the lump.  Increased discomfort if you cough or make a sudden movement. If the hernia has blocked part of the intestine, a serious complication can occur (incarcerated or strangulated hernia). This can  become a problem that requires emergency surgery because the blood flow to the blocked intestine may be cut off. Symptoms may include:  Feeling sick to your stomach (nauseous).  Throwing up (vomiting).  Stomach swelling (distention) or bloating.  Fever.  Rapid heartbeat. DIAGNOSIS  Your health care provider will take a medical history and perform a physical exam. Various tests may be ordered, such as:  Blood tests.  Urine tests.  Ultrasonography.  X-rays.  Computed tomography (CT). TREATMENT  Watchful waiting may be all that is needed for a smaller hernia that does not cause symptoms. Your health care provider may recommend the use of a supportive belt (truss) that helps to keep the abdominal wall intact. For larger hernias or those that cause pain, surgery to repair the hernia is usually recommended. If a hernia becomes strangulated, emergency surgery needs to be done right away. HOME CARE INSTRUCTIONS  Avoid putting pressure or strain on the abdominal area.  Avoid heavy lifting.  Use good body positioning for physical tasks. Ask your health care provider about proper body positioning.  Use a supportive belt as directed by your health care provider.  Maintain a healthy weight.  Eat foods that are high in fiber, such as whole grains, fruits, and vegetables. Fiber helps prevent difficult bowel movements (constipation).  Drink enough fluids to keep your urine clear or pale yellow.  Follow up with your health care provider as directed. SEEK MEDICAL CARE IF:   Your hernia seems to be getting larger or more painful. SEEK IMMEDIATE MEDICAL CARE IF:   You have abdominal pain that is sudden and sharp.  Your pain becomes severe.  You have repeated vomiting.  You are sweating a lot.  You notice a rapid heartbeat.  You develop a fever. MAKE SURE YOU:   Understand these instructions.  Will watch your condition.  Will get help right away if you are not doing well or  get worse.   This information is not intended to replace advice given to you by your health care provider. Make sure you discuss any questions you have with your health care provider.   Document Released: 08/22/2012 Document Revised: 09/26/2014 Document Reviewed: 08/22/2012 Elsevier Interactive Patient Education [employer].

## 2016-06-01 NOTE — Addendum Note (Signed)
Addended by: Beatris Ship L on: 06/01/2016 01:31 PM   Modules accepted: Orders

## 2016-06-01 NOTE — Progress Notes (Addendum)
   Subjective:    Patient ID: Elijah Ford, male    DOB: [birth year], 40 y.o.   MRN: [id number]  HPI Pt presents to the clinic because he has noticed a bulge of abdomen that comes and goes. It is tender at times but no pain. Notices more when going to get up or do a sit up. No bowel changes.    Review of Systems  All other systems reviewed and are negative.      Objective:   Physical Exam  Constitutional: He is oriented to person, place, and time. He appears well-developed and well-nourished.  HENT:  Head: Normocephalic and atraumatic.  Cardiovascular: Normal rate, regular rhythm and normal heart sounds.   Pulmonary/Chest: Effort normal and breath sounds normal.  Abdominal: Soft. Bowel sounds are normal. He exhibits no distension and no mass. There is no tenderness. There is no rebound and no guarding.  3cm by 4cm ventral hernia reducible with minimal tenderness.   Neurological: He is alert and oriented to person, place, and time.  Psychiatric: He has a normal mood and affect. His behavior is normal.          Assessment & Plan:  Ventral hernia without obstruction- reassured patient. Gave HO. If not bothersome or becomes irreducible then no treatment needed. Follow up as needed. Discussed option of surgery.  Testosterone shot given without complication.   Flu shot given without complication.

## 2016-06-08 ENCOUNTER — Ambulatory Visit: Payer: Self-pay | Admitting: Surgery

## 2016-06-10 ENCOUNTER — Other Ambulatory Visit: Payer: Self-pay | Admitting: Surgery

## 2016-06-10 DIAGNOSIS — M6208 Separation of muscle (nontraumatic), other site: Secondary | ICD-10-CM

## 2016-06-15 ENCOUNTER — Ambulatory Visit (INDEPENDENT_AMBULATORY_CARE_PROVIDER_SITE_OTHER): Payer: 59 | Admitting: Physician Assistant

## 2016-06-15 VITALS — BP 130/83 | HR 75 | Wt 322.0 lb

## 2016-06-15 DIAGNOSIS — E291 Testicular hypofunction: Secondary | ICD-10-CM | POA: Diagnosis not present

## 2016-06-15 MED ORDER — TESTOSTERONE CYPIONATE 200 MG/ML IM SOLN
200.0000 mg | Freq: Once | INTRAMUSCULAR | Status: AC
  Administered 2016-06-15: 200 mg via INTRAMUSCULAR

## 2016-06-15 NOTE — Progress Notes (Signed)
Patient came into office today for testosterone injection. Denies chest pain, shortness of breath, headaches and problems associated with taking this medication. Patient states he has had no abnornal mood swings. Patient tolerated injection in [place] well without complications. Patient advised to schedule his next injection for 2 weeks from today. 

## 2016-06-16 ENCOUNTER — Ambulatory Visit
Admission: RE | Admit: 2016-06-16 | Discharge: 2016-06-16 | Disposition: A | Discharge status: Home / Self Care | Payer: 59 | Source: Ambulatory Visit | Attending: Surgery | Admitting: Surgery

## 2016-06-16 DIAGNOSIS — M6208 Separation of muscle (nontraumatic), other site: Secondary | ICD-10-CM

## 2016-06-16 MED ORDER — IOPAMIDOL (ISOVUE-300) INJECTION 61%
100.0000 mL | Freq: Once | INTRAVENOUS | Status: AC | PRN
  Administered 2016-06-16: 100 mL via INTRAVENOUS

## 2016-06-20 MED FILL — OMEPRAZOLE DR 40 MG CAPSULE: 40 | 90 days supply | Qty: 90 | Fill #3

## 2016-06-22 MED FILL — BUPROPION HCL XL 300 MG TAB: 300 | 90 days supply | Qty: 90 | Fill #0

## 2016-06-27 ENCOUNTER — Telehealth: Payer: 59 | Admitting: Family

## 2016-06-27 DIAGNOSIS — J4521 Mild intermittent asthma with (acute) exacerbation: Secondary | ICD-10-CM

## 2016-06-27 DIAGNOSIS — J209 Acute bronchitis, unspecified: Secondary | ICD-10-CM

## 2016-06-27 MED ORDER — ALBUTEROL SULFATE HFA 108 (90 BASE) MCG/ACT IN AERS: 2.0000 | INHALATION_SPRAY | Freq: Four times a day (QID) | RESPIRATORY_TRACT | 0 refills | Status: DC | PRN

## 2016-06-27 MED ORDER — BENZONATATE 100 MG PO CAPS: 100.0000 mg | ORAL_CAPSULE | Freq: Two times a day (BID) | ORAL | 0 refills | Status: DC | PRN

## 2016-06-27 MED ORDER — AZITHROMYCIN 250 MG PO TABS: ORAL_TABLET | ORAL | 0 refills | Status: DC

## 2016-06-27 MED FILL — PROAIR HFA 90 MCG INHALER: 108 (90 BAS | 30 days supply | Qty: 9 | Fill #1 | Status: TO

## 2016-06-27 MED FILL — IPRAT-ALBUT 0.5-3(2.5) MG/3: 0.5-2.5 (3) | 20 days supply | Qty: 360 | Fill #0

## 2016-06-27 MED FILL — AZITHROMYCIN 250 MG TABLET: 250 | 5 days supply | Qty: 6 | Fill #0

## 2016-06-27 MED FILL — BENZONATATE 100 MG CAPSULE: 100 | 10 days supply | Qty: 20 | Fill #0

## 2016-06-27 NOTE — Addendum Note (Signed)
Addended by: Evelina Dun A on: 06/27/2016 10:37 AM   Modules accepted: Orders

## 2016-06-27 NOTE — Progress Notes (Signed)
We are sorry that you are not feeling well.  Here is how we plan to help!  Based on what you have shared with me it looks like you have upper respiratory tract inflammation that has resulted in a significant cough.  Inflammation and infection in the upper respiratory tract is commonly called bronchitis and has four common causes:  Allergies, Viral Infections, Acid Reflux and Bacterial Infections.  Allergies, viruses and acid reflux are treated by controlling symptoms or eliminating the cause. An example might be a cough caused by taking certain blood pressure medications. You stop the cough by changing the medication. Another example might be a cough caused by acid reflux. Controlling the reflux helps control the cough.  Based on your presentation I believe you most likely have A cough due to bacteria.  When patients have a fever and a productive cough with a change in color or increased sputum production, we are concerned about bacterial bronchitis.  If left untreated it can progress to pneumonia.  If your symptoms do not improve with your treatment plan it is important that you contact your provider.   I have prescribed Azithromyin 250 mg: two tables now and then one tablet daily for 4 additonal days   In addition you may use A non-prescription cough medication called Robitussin DAC. Take 2 teaspoons every 8 hours or Delsym: take 2 teaspoons every 12 hours., A non-prescription cough medication called Mucinex DM: take 2 tablets every 12 hours. and A prescription cough medication called Tessalon Perles 100mg . You may take 1-2 capsules every 8 hours as needed for your cough. I have also refilled your albuterol.     HOME CARE . Only take medications as instructed by your medical team. . Complete the entire course of an antibiotic. . Drink plenty of fluids and get plenty of rest. . Avoid close contacts especially the very young and the elderly . Cover your mouth if you cough or cough into your  sleeve. . Always remember to wash your hands . A steam or ultrasonic humidifier can help congestion.    GET HELP RIGHT AWAY IF: . You develop worsening fever. . You become short of breath . You cough up blood. . Your symptoms persist after you have completed your treatment plan MAKE SURE YOU   Understand these instructions.  Will watch your condition.  Will get help right away if you are not doing well or get worse.  Your e-visit answers were reviewed by a board certified advanced clinical practitioner to complete your personal care plan.  Depending on the condition, your plan could have included both over the counter or prescription medications. If there is a problem please reply  once you have received a response from your provider. Your safety is important to [place].  If you have drug allergies check your prescription carefully.    You can use MyChart to ask questions about today's visit, request a non-urgent call back, or ask for a work or school excuse for 24 hours related to this e-Visit. If it has been greater than 24 hours you will need to follow up with your provider, or enter a new e-Visit to address those concerns. You will get an e-mail in the next two days asking about your experience.  I hope that your e-visit has been valuable and will speed your recovery. Thank you for using e-visits.

## 2016-06-29 ENCOUNTER — Ambulatory Visit (INDEPENDENT_AMBULATORY_CARE_PROVIDER_SITE_OTHER): Payer: 59 | Admitting: Sports Medicine

## 2016-06-29 ENCOUNTER — Ambulatory Visit: Payer: Self-pay | Admitting: Surgery

## 2016-06-29 VITALS — BP 122/73 | HR 75

## 2016-06-29 DIAGNOSIS — E291 Testicular hypofunction: Secondary | ICD-10-CM | POA: Diagnosis not present

## 2016-06-29 MED ORDER — TESTOSTERONE CYPIONATE 200 MG/ML IM SOLN
200.0000 mg | Freq: Once | INTRAMUSCULAR | Status: AC
  Administered 2016-06-29: 200 mg via INTRAMUSCULAR

## 2016-06-29 NOTE — Progress Notes (Signed)
Testosterone given [place]OQ without complication.

## 2016-06-29 NOTE — H&P (Signed)
Elijah Ford 06/29/2016 4:06 PM Location: [facility] Patient #: [id number] DOB: [birth year] Married / Language: Cleophus Molt / Race: White Male  History of Present Illness Elijah Ford A. Elijah Mcgillicuddy MD; 06/29/2016 4:58 PM) Patient words: Patient returns for follow-up after CT scan is abdomen to evaluate for hernia. He has a left inguinal hernia by CT in 2 small ventral hernias one at his umbilicus and one approximately 3-4 cm above this. He is here today to discuss surgery.               Mid abdominal bulge, evaluate for ventral hernia 3 weeks. No previous surgery. No injury.  EXAM: CT ABDOMEN AND PELVIS WITH CONTRAST  TECHNIQUE: Multidetector CT imaging of the abdomen and pelvis was performed using the standard protocol following bolus administration of intravenous contrast.  CONTRAST: 151mL ISOVUE-300 IOPAMIDOL (ISOVUE-300) INJECTION 61%  COMPARISON: Chest CT 10/17/2014  FINDINGS: Lower chest: Lung bases demonstrate minimal bibasilar linear scarring/ atelectasis.  Hepatobiliary: Within normal.  Pancreas: Within normal.  Spleen: Within normal.  Adrenals/Urinary Tract: Adrenal glands are normal and symmetric. Kidneys are normal size without hydronephrosis or nephrolithiasis. No focal mass. Ureters and bladder are within normal.  Stomach/Bowel: Possible mild wall thickening along the greater curvature of the stomach. Small bowel and appendix are normal. Mild redundancy of the sigmoid colon as the colon is otherwise unremarkable.  Vascular/Lymphatic: Minimal calcified plaque over the common iliac arteries. Vascular structures are otherwise within normal. No significant adenopathy.  Reproductive: Within normal.  Other: Tiny umbilical hernia containing only peritoneal fat. Tiny midline ventral hernia above the umbilicus containing only peritoneal fat. No evidence of inflammatory change or free fluid. Small left inguinal hernia containing only peritoneal  fat.  Musculoskeletal: Minimal degenerate change of the spine and hips.  IMPRESSION: No acute findings in the abdomen/pelvis.  Tiny umbilical hernia containing only peritoneal fat. Tiny midline ventral hernia above the umbilicus containing only fat. Small fat containing left inguinal hernia.  Mild wall thickening along the greater curvature of the stomach which may be due to underdistention, although cannot exclude gastritis.   Electronically Signed By: Marin Olp M.D. On: 06/16/2016 09:17.  The patient is a 40 year old male.   Allergies Elbert Ewings, [place]; 06/29/2016 4:06 PM) No Known Drug Allergies 06/08/2016  Medication History Elbert Ewings, CMA; 06/29/2016 4:07 PM) Allopurinol (300MG  Tablet, Oral) Active. BuPROPion HCl ER (XL) (300MG  Tablet ER 24HR, Oral) Active. FLUoxetine HCl (40MG  Capsule, Oral) Active. Meloxicam (15MG  Tablet, Oral) Active. Omeprazole (40MG  Capsule DR, Oral) Active. HydrOXYzine HCl (50MG  Tablet, Oral) Active. Levocetirizine Dihydrochloride (5MG  Tablet, Oral) Active. Testosterone (Implant) Specific dose unknown - Active. Flonase (50MCG/ACT Suspension, Nasal) Active. Montelukast Sodium (10MG  Tablet, Oral) Active. Zithromax (250MG  Tablet, Oral) Active. Medications Reconciled    Vitals Elbert Ewings CMA; 06/29/2016 4:07 PM) 06/29/2016 4:07 PM Weight: 320 lb Height: 80in Body Surface Area: 2.81 m Body Mass Index: 35.15 kg/m  Temp.: 98.68F(Temporal)  Pulse: 64 (Regular)  BP: 130/80 (Sitting, Left Arm, Standard)      Physical Exam (Daneil Beem A. Rickey Sadowski MD; 06/29/2016 4:58 PM)  General Mental Status-Alert. General Appearance-Consistent with stated age. Hydration-Well hydrated. Voice-Normal.  Abdomen Note: not reexamined today  Musculoskeletal Normal Exam - Left-Upper Extremity Strength Normal and Lower Extremity Strength Normal. Normal Exam - Right-Upper Extremity Strength Normal and Lower  Extremity Strength Normal.    Assessment & Plan (Ilaisaane Marts A. Jaclyn Andy MD; 06/29/2016 4:59 PM)  VENTRAL HERNIA (K43.9) Impression: Patient desires repair. Discussed laparoscopic and open approaches with him today as well  as the use of mesh. He is opted for open repair of his ventral hernia 2 and left inguinal hernia with mesh needed. The risk of hernia repair include bleeding, infection, organ injury, bowel injury, bladder injury, nerve injury recurrent hernia, blood clots, worsening of underlying condition, chronic pain, mesh use, open surgery, death, and the need for other operattions. Pt agrees to proceed  VENTRAL HERNIA WITHOUT OBSTRUCTION OR GANGRENE (K43.9)  LEFT INGUINAL HERNIA (K40.90)  Current Plans Pt Education - Pamphlet Given - Hernia Surgery: discussed with patient and provided information. The anatomy & physiology of the abdominal wall was discussed. The pathophysiology of hernias was discussed. Natural history risks without surgery including progeressive enlargement, pain, incarceration, & strangulation was discussed. Contributors to complications such as smoking, obesity, diabetes, prior surgery, etc were discussed.  I feel the risks of no intervention will lead to serious problems that outweigh the operative risks; therefore, I recommended surgery to reduce and repair the hernia. I explained laparoscopic techniques with possible need for an open approach. I noted the probable use of mesh to patch and/or buttress the hernia repair  Risks such as bleeding, infection, abscess, need for further treatment, heart attack, death, and other risks were discussed. I noted a good likelihood this will help address the problem. Goals of post-operative recovery were discussed as well. Possibility that this will not correct all symptoms was explained. I stressed the importance of low-impact activity, aggressive pain control, avoiding constipation, & not pushing through pain to minimize risk of  post-operative chronic pain or injury. Possibility of reherniation especially with smoking, obesity, diabetes, immunosuppression, and other health conditions was discussed. We will work to minimize complications.  An educational handout further explaining the pathology & treatment options was given as well. Questions were answered. The patient expresses understanding & wishes to proceed with surgery.  The anatomy & physiology of the abdominal wall and pelvic floor was discussed. The pathophysiology of hernias in the inguinal and pelvic region was discussed. Natural history risks such as progressive enlargement, pain, incarceration, and strangulation was discussed. Contributors to complications such as smoking, obesity, diabetes, prior surgery, etc were discussed.  I feel the risks of no intervention will lead to serious problems that outweigh the operative risks; therefore, I recommended surgery to reduce and repair the hernia. I explained laparoscopic techniques with possible need for an open approach. I noted usual use of mesh to patch and/or buttress hernia repair  Risks such as bleeding, infection, abscess, need for further treatment, heart attack, death, and other risks were discussed. I noted a good likelihood this will help address the problem. Goals of post-operative recovery were discussed as well. Possibility that this will not correct all symptoms was explained. I stressed the importance of low-impact activity, aggressive pain control, avoiding constipation, & not pushing through pain to minimize risk of post-operative chronic pain or injury. Possibility of reherniation was discussed. We will work to minimize complications.  An educational handout further explaining the pathology & treatment options was given as well. Questions were answered. The patient expresses understanding & wishes to proceed with surgery.  Pt Education - Consent for inguinal hernia - [place]inger: discussed with patient and  provided information. You are being scheduled for surgery - Our schedulers will call you.  You should hear from our office's scheduling department within 5 working days about the location, date, and time of surgery. We try to make accommodations for patient's preferences in scheduling surgery, but sometimes the OR schedule or the surgeon's schedule  prevents [place] from making those accommodations.  If you have not heard from our office [phone]) in 5 working days, call the office and ask for your surgeon's nurse.  If you have other questions about your diagnosis, plan, or surgery, call the office and ask for your surgeon's nurse.

## 2016-07-07 MED FILL — MELOXICAM 15 MG TABLET: 15 | 30 days supply | Qty: 30 | Fill #1

## 2016-07-13 ENCOUNTER — Ambulatory Visit (INDEPENDENT_AMBULATORY_CARE_PROVIDER_SITE_OTHER): Payer: 59 | Admitting: Physician Assistant

## 2016-07-13 VITALS — BP 115/74 | HR 85 | Wt 321.0 lb

## 2016-07-13 DIAGNOSIS — E291 Testicular hypofunction: Secondary | ICD-10-CM | POA: Diagnosis not present

## 2016-07-13 MED ORDER — TESTOSTERONE CYPIONATE 200 MG/ML IM SOLN
200.0000 mg | Freq: Once | INTRAMUSCULAR | Status: AC
  Administered 2016-07-13: 200 mg via INTRAMUSCULAR

## 2016-07-13 NOTE — Progress Notes (Signed)
Patient came into office today for testosterone injection. Denies chest pain, shortness of breath, headaches and problems associated with taking this medication. Patient states he has had no abnornal mood swings. Patient tolerated injection in [place] well without complications. Patient advised to schedule his next injection for 2 weeks from today. 

## 2016-07-19 ENCOUNTER — Encounter (HOSPITAL_COMMUNITY)
Admission: RE | Admit: 2016-07-19 | Discharge: 2016-07-19 | Disposition: A | Discharge status: Home / Self Care | Payer: 59 | Source: Ambulatory Visit | Attending: Surgery | Admitting: Surgery

## 2016-07-19 ENCOUNTER — Encounter (HOSPITAL_COMMUNITY): Payer: Self-pay

## 2016-07-19 DIAGNOSIS — J45909 Unspecified asthma, uncomplicated: Secondary | ICD-10-CM | POA: Diagnosis not present

## 2016-07-19 DIAGNOSIS — K219 Gastro-esophageal reflux disease without esophagitis: Secondary | ICD-10-CM | POA: Diagnosis not present

## 2016-07-19 DIAGNOSIS — K4090 Unilateral inguinal hernia, without obstruction or gangrene, not specified as recurrent: Secondary | ICD-10-CM | POA: Diagnosis not present

## 2016-07-19 DIAGNOSIS — I10 Essential (primary) hypertension: Secondary | ICD-10-CM | POA: Diagnosis not present

## 2016-07-19 DIAGNOSIS — F419 Anxiety disorder, unspecified: Secondary | ICD-10-CM | POA: Diagnosis not present

## 2016-07-19 DIAGNOSIS — M1990 Unspecified osteoarthritis, unspecified site: Secondary | ICD-10-CM | POA: Diagnosis not present

## 2016-07-19 DIAGNOSIS — K439 Ventral hernia without obstruction or gangrene: Secondary | ICD-10-CM | POA: Diagnosis present

## 2016-07-19 DIAGNOSIS — Z6835 Body mass index (BMI) 35.0-35.9, adult: Secondary | ICD-10-CM | POA: Diagnosis not present

## 2016-07-19 DIAGNOSIS — G4730 Sleep apnea, unspecified: Secondary | ICD-10-CM | POA: Diagnosis not present

## 2016-07-19 DIAGNOSIS — F329 Major depressive disorder, single episode, unspecified: Secondary | ICD-10-CM | POA: Diagnosis not present

## 2016-07-19 DIAGNOSIS — E669 Obesity, unspecified: Secondary | ICD-10-CM | POA: Diagnosis not present

## 2016-07-19 DIAGNOSIS — K429 Umbilical hernia without obstruction or gangrene: Secondary | ICD-10-CM | POA: Diagnosis not present

## 2016-07-19 HISTORY — DX: Major depressive disorder, single episode, unspecified: F32.9

## 2016-07-19 HISTORY — DX: Unspecified osteoarthritis, unspecified site: M19.90

## 2016-07-19 HISTORY — DX: Depression, unspecified: F32.A

## 2016-07-19 HISTORY — DX: Other seasonal allergic rhinitis: J30.2

## 2016-07-19 HISTORY — DX: Anxiety disorder, unspecified: F41.9

## 2016-07-19 HISTORY — DX: Gastro-esophageal reflux disease without esophagitis: K21.9

## 2016-07-19 HISTORY — DX: Pneumonia, unspecified organism: J18.9

## 2016-07-19 HISTORY — DX: Sleep apnea, unspecified: G47.30

## 2016-07-19 LAB — BASIC METABOLIC PANEL
ANION GAP: 6 (ref 5–15)
BUN: 14 mg/dL (ref 6–20)
CALCIUM: 9.1 mg/dL (ref 8.9–10.3)
CHLORIDE: 105 mmol/L (ref 101–111)
CO2: 26 mmol/L (ref 22–32)
CREATININE: 1.33 mg/dL — AB (ref 0.61–1.24)
GFR calc Af Amer: >60 mL/min (ref >60)
GFR calc non Af Amer: >60 mL/min (ref >60)
GLUCOSE: 111 mg/dL — AB (ref 65–99)
Potassium: 4.6 mmol/L (ref 3.5–5.1)
Sodium: 137 mmol/L (ref 135–145)

## 2016-07-19 LAB — CBC
HEMATOCRIT: 50.8 % (ref 39.0–52.0)
HEMOGLOBIN: 18.1 g/dL — AB (ref 13.0–17.0)
MCH: 31.7 pg (ref 26.0–34.0)
MCHC: 35.6 g/dL (ref 30.0–36.0)
MCV: 89 fL (ref 78.0–100.0)
Platelets: 212 10*3/uL (ref 150–400)
RBC: 5.71 MIL/uL (ref 4.22–5.81)
RDW: 13.3 % (ref 11.5–15.5)
WBC: 5.8 10*3/uL (ref 4.0–10.5)

## 2016-07-19 NOTE — Progress Notes (Signed)
Pt. Remarks that when he ended an ill marriage his HTN resolved, no longer takes meds for it.  Pt. 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 Pt. Denies chest, breathing concerns today or of recent.  Pt. Follows at [facility] _ PCP- Driscoll, denies any cardiac stress test in his past.

## 2016-07-19 NOTE — Pre-Procedure Instructions (Signed)
Elijah Ford  07/19/2016      [place] [place] [address] Phone: [phone] Fax: [phone]    Your procedure is scheduled on Thursday, November 2 nd   Report to [facility] Admitting at 12:15 PM             (Posted surgery time 2:15 pm - 4:15 pm)   Call this number if you have problems the MORNING of surgery:  [phone]   Remember:  Do not eat food or drink liquids after midnight Wednesday.   Take these medicines the morning of surgery with A SIP OF WATER : Wellbutrin, Prozac, Allopurinol,, Vistaril, Prilosec, Flonase             4-5 days prior to surgery, STOP TAKING any herbal supplements, Vitamins, anti-inflammatories   Do not wear jewelry - no rings or watches.  Do not wear lotions, powders, colognes, or deoderant.              Men may shave face and neck.  Do not bring valuables to the hospital.  [facility] is not responsible for any belongings or valuables.    Special Instructions: [facility] - Preparing for Surgery  Before surgery, you can play an important role.  Because skin is not sterile, your skin needs to be as free of germs as possible.  You can reduce the number of germs on you skin by washing with CHG (chlorahexidine gluconate) soap before surgery.  CHG is an antiseptic cleaner which kills germs and bonds with the skin to continue killing germs even after washing.  Please DO NOT use if you have an allergy to CHG or antibacterial soaps.  If your skin becomes reddened/irritated stop using the CHG and inform your nurse when you arrive at Short Stay.  Do not shave (including legs and underarms) for at least 48 hours prior to the first CHG shower.  You may shave your face.  Please follow these instructions carefully:   1.  Shower with CHG Soap the night before surgery and the  morning of Surgery.  2.  If you choose to wash your hair, wash  your hair first as usual with your  normal shampoo.  3.  After you shampoo, rinse your hair and body thoroughly to remove the  Shampoo.  4.  Use CHG as you would any other liquid soap.  You can apply chg directly to the skin and wash gently with scrungie or a clean washcloth.  5.  Apply the CHG Soap to your body ONLY FROM THE NECK DOWN.    Do not use on open wounds or open sores.  Avoid contact with your eyes, ears, mouth and genitals (private parts).  Wash genitals (private parts)   with your normal soap.  6.  Wash thoroughly, paying special attention to the area where your surgery will be performed.  7.  Thoroughly rinse your body with warm water from the neck down.  8.  DO NOT shower/wash with your normal soap after using and rinsing off   the CHG Soap.  9.  Pat yourself dry with a clean towel.            10.  Wear clean pajamas.            11.  Place clean sheets on your bed the night of your first shower and do not  sleep with pets.  Day of Surgery  Do not apply any lotions/deodorants the morning of surgery.  Please wear clean clothes to the hospital/surgery center.  Contacts, dentures or bridgework may not be worn into surgery.  Leave your suitcase in the car.  After surgery it may be brought to your room. For patients admitted to the hospital, discharge time will be determined by your treatment team.  Name and phone number of your driver:  wife   Please read over the following fact sheets that you were given. Pain Booklet and Surgical Site Infection Prevention

## 2016-07-20 MED ORDER — GABAPENTIN 300 MG PO CAPS
300.0000 mg | ORAL_CAPSULE | ORAL | Status: AC
  Administered 2016-07-21: 300 mg via ORAL
  Filled 2016-07-20: qty 1

## 2016-07-20 MED ORDER — ACETAMINOPHEN 500 MG PO TABS
1000.0000 mg | ORAL_TABLET | ORAL | Status: AC
Start: 2016-07-21 — End: 2016-07-21
  Administered 2016-07-21: 1000 mg via ORAL
  Filled 2016-07-20: qty 2

## 2016-07-20 MED ORDER — DEXTROSE 5 % IV SOLN
3.0000 g | INTRAVENOUS | Status: AC
  Administered 2016-07-21: 3 g via INTRAVENOUS
  Filled 2016-07-20: qty 3000

## 2016-07-20 MED ORDER — CELECOXIB 200 MG PO CAPS
400.0000 mg | ORAL_CAPSULE | ORAL | Status: AC
  Administered 2016-07-21: 400 mg via ORAL
  Filled 2016-07-20: qty 2

## 2016-07-21 ENCOUNTER — Encounter (HOSPITAL_COMMUNITY)
Admission: RE | Disposition: A | Discharge status: Home / Self Care | Payer: Self-pay | Source: Ambulatory Visit | Attending: Surgery

## 2016-07-21 ENCOUNTER — Ambulatory Visit (HOSPITAL_COMMUNITY): Payer: 59 | Admitting: Anesthesiology

## 2016-07-21 ENCOUNTER — Ambulatory Visit (HOSPITAL_COMMUNITY)
Admission: RE | Admit: 2016-07-21 | Discharge: 2016-07-21 | Disposition: A | Discharge status: Home / Self Care | Payer: 59 | Source: Ambulatory Visit | Attending: Surgery | Admitting: Surgery

## 2016-07-21 ENCOUNTER — Encounter (HOSPITAL_COMMUNITY): Payer: Self-pay | Admitting: Certified Registered Nurse Anesthetist

## 2016-07-21 DIAGNOSIS — F329 Major depressive disorder, single episode, unspecified: Secondary | ICD-10-CM | POA: Insufficient documentation

## 2016-07-21 DIAGNOSIS — Z6835 Body mass index (BMI) 35.0-35.9, adult: Secondary | ICD-10-CM | POA: Insufficient documentation

## 2016-07-21 DIAGNOSIS — K429 Umbilical hernia without obstruction or gangrene: Secondary | ICD-10-CM | POA: Insufficient documentation

## 2016-07-21 DIAGNOSIS — M1990 Unspecified osteoarthritis, unspecified site: Secondary | ICD-10-CM | POA: Insufficient documentation

## 2016-07-21 DIAGNOSIS — K4090 Unilateral inguinal hernia, without obstruction or gangrene, not specified as recurrent: Secondary | ICD-10-CM | POA: Insufficient documentation

## 2016-07-21 DIAGNOSIS — I10 Essential (primary) hypertension: Secondary | ICD-10-CM | POA: Insufficient documentation

## 2016-07-21 DIAGNOSIS — K219 Gastro-esophageal reflux disease without esophagitis: Secondary | ICD-10-CM | POA: Insufficient documentation

## 2016-07-21 DIAGNOSIS — G4730 Sleep apnea, unspecified: Secondary | ICD-10-CM | POA: Insufficient documentation

## 2016-07-21 DIAGNOSIS — F419 Anxiety disorder, unspecified: Secondary | ICD-10-CM | POA: Insufficient documentation

## 2016-07-21 DIAGNOSIS — E669 Obesity, unspecified: Secondary | ICD-10-CM | POA: Insufficient documentation

## 2016-07-21 DIAGNOSIS — K439 Ventral hernia without obstruction or gangrene: Secondary | ICD-10-CM | POA: Diagnosis not present

## 2016-07-21 DIAGNOSIS — J45909 Unspecified asthma, uncomplicated: Secondary | ICD-10-CM | POA: Insufficient documentation

## 2016-07-21 HISTORY — PX: INGUINAL HERNIA REPAIR: SHX194

## 2016-07-21 HISTORY — PX: INSERTION OF MESH: SHX5868

## 2016-07-21 HISTORY — PX: VENTRAL HERNIA REPAIR: SHX424

## 2016-07-21 SURGERY — REPAIR, HERNIA, VENTRAL
Anesthesia: General | Site: Inguinal

## 2016-07-21 MED ORDER — ONDANSETRON HCL 4 MG/2ML IJ SOLN
INTRAMUSCULAR | Status: DC | PRN
  Administered 2016-07-21: 4 mg via INTRAVENOUS

## 2016-07-21 MED ORDER — SUGAMMADEX SODIUM 200 MG/2ML IV SOLN
INTRAVENOUS | Status: DC | PRN
  Administered 2016-07-21: 294 mg via INTRAVENOUS

## 2016-07-21 MED ORDER — ONDANSETRON HCL 4 MG/2ML IJ SOLN
INTRAMUSCULAR | Status: AC
  Filled 2016-07-21: qty 2

## 2016-07-21 MED ORDER — CHLORHEXIDINE GLUCONATE CLOTH 2 % EX PADS: 6.0000 | MEDICATED_PAD | Freq: Once | CUTANEOUS | 0 refills | Status: AC

## 2016-07-21 MED ORDER — MIDAZOLAM HCL 5 MG/5ML IJ SOLN
INTRAMUSCULAR | Status: DC | PRN
  Administered 2016-07-21: 2 mg via INTRAVENOUS

## 2016-07-21 MED ORDER — ONDANSETRON HCL 4 MG/2ML IJ SOLN
4.0000 mg | Freq: Four times a day (QID) | INTRAMUSCULAR | Status: AC | PRN
  Administered 2016-07-21: 4 mg via INTRAVENOUS

## 2016-07-21 MED ORDER — SUGAMMADEX SODIUM 200 MG/2ML IV SOLN
INTRAVENOUS | Status: AC
  Filled 2016-07-21: qty 2

## 2016-07-21 MED ORDER — PROPOFOL 10 MG/ML IV BOLUS
INTRAVENOUS | Status: AC
  Filled 2016-07-21: qty 20

## 2016-07-21 MED ORDER — HYDROMORPHONE HCL 2 MG/ML IJ SOLN
INTRAMUSCULAR | Status: AC
  Filled 2016-07-21: qty 1

## 2016-07-21 MED ORDER — FENTANYL CITRATE (PF) 100 MCG/2ML IJ SOLN
INTRAMUSCULAR | Status: AC
  Filled 2016-07-21: qty 4

## 2016-07-21 MED ORDER — BUPIVACAINE-EPINEPHRINE (PF) 0.5% -1:200000 IJ SOLN
INTRAMUSCULAR | Status: DC | PRN
  Administered 2016-07-21: 20 mL

## 2016-07-21 MED ORDER — LACTATED RINGERS IV SOLN
Freq: Once | INTRAVENOUS | Status: AC
  Administered 2016-07-21: 13:00:00 via INTRAVENOUS

## 2016-07-21 MED ORDER — EPHEDRINE 5 MG/ML INJ
INTRAVENOUS | Status: AC
  Filled 2016-07-21: qty 10

## 2016-07-21 MED ORDER — LACTATED RINGERS IV SOLN
INTRAVENOUS | Status: DC | PRN
  Administered 2016-07-21 (×2): via INTRAVENOUS

## 2016-07-21 MED ORDER — FENTANYL CITRATE (PF) 100 MCG/2ML IJ SOLN
INTRAMUSCULAR | Status: DC | PRN
  Administered 2016-07-21: 100 ug via INTRAVENOUS
  Administered 2016-07-21 (×2): 50 ug via INTRAVENOUS

## 2016-07-21 MED ORDER — BUPIVACAINE HCL (PF) 0.5 % IJ SOLN
INTRAMUSCULAR | Status: AC
  Filled 2016-07-21: qty 30

## 2016-07-21 MED ORDER — BUPIVACAINE HCL (PF) 0.25 % IJ SOLN
INTRAMUSCULAR | Status: AC
  Filled 2016-07-21: qty 30

## 2016-07-21 MED ORDER — CHLORHEXIDINE GLUCONATE CLOTH 2 % EX PADS: 6.0000 | MEDICATED_PAD | Freq: Once | CUTANEOUS | Status: DC

## 2016-07-21 MED ORDER — OXYCODONE HCL 5 MG PO TABS: 5.0000 mg | ORAL_TABLET | Freq: Once | ORAL | Status: DC | PRN

## 2016-07-21 MED ORDER — 0.9 % SODIUM CHLORIDE (POUR BTL) OPTIME
TOPICAL | Status: DC | PRN
  Administered 2016-07-21: 1000 mL

## 2016-07-21 MED ORDER — PROPOFOL 10 MG/ML IV BOLUS
INTRAVENOUS | Status: DC | PRN
  Administered 2016-07-21: 300 mg via INTRAVENOUS

## 2016-07-21 MED ORDER — OXYCODONE HCL 5 MG/5ML PO SOLN: 5.0000 mg | Freq: Once | ORAL | Status: DC | PRN

## 2016-07-21 MED ORDER — ROCURONIUM BROMIDE 100 MG/10ML IV SOLN
INTRAVENOUS | Status: DC | PRN
  Administered 2016-07-21: 50 mg via INTRAVENOUS

## 2016-07-21 MED ORDER — SUCCINYLCHOLINE CHLORIDE 200 MG/10ML IV SOSY
PREFILLED_SYRINGE | INTRAVENOUS | Status: AC
  Filled 2016-07-21: qty 10

## 2016-07-21 MED ORDER — HYDROMORPHONE HCL 1 MG/ML IJ SOLN
0.2500 mg | INTRAMUSCULAR | Status: DC | PRN
  Administered 2016-07-21 (×2): 0.5 mg via INTRAVENOUS

## 2016-07-21 MED ORDER — MIDAZOLAM HCL 2 MG/2ML IJ SOLN
INTRAMUSCULAR | Status: AC
  Filled 2016-07-21: qty 2

## 2016-07-21 MED ORDER — EPHEDRINE SULFATE-NACL 50-0.9 MG/10ML-% IV SOSY
PREFILLED_SYRINGE | INTRAVENOUS | Status: DC | PRN
  Administered 2016-07-21: 10 mg via INTRAVENOUS

## 2016-07-21 MED ORDER — LIDOCAINE HCL (CARDIAC) 20 MG/ML IV SOLN
INTRAVENOUS | Status: DC | PRN
  Administered 2016-07-21: 70 mg via INTRAVENOUS

## 2016-07-21 MED ORDER — OXYCODONE-ACETAMINOPHEN 5-325 MG PO TABS: 1.0000 | ORAL_TABLET | ORAL | 0 refills | Status: DC | PRN

## 2016-07-21 SURGICAL SUPPLY — 60 items
BLADE SURG ROTATE 9660 (MISCELLANEOUS) IMPLANT
CANISTER SUCTION 2500CC (MISCELLANEOUS) ×4 IMPLANT
CHLORAPREP W/TINT 26ML (MISCELLANEOUS) ×4 IMPLANT
COVER SURGICAL LIGHT HANDLE (MISCELLANEOUS) ×4 IMPLANT
DERMABOND ADVANCED (GAUZE/BANDAGES/DRESSINGS) ×2
DERMABOND ADVANCED .7 DNX12 (GAUZE/BANDAGES/DRESSINGS) ×2 IMPLANT
DRAIN PENROSE 1/2X12 LTX STRL (WOUND CARE) IMPLANT
DRAPE LAPAROSCOPIC ABDOMINAL (DRAPES) ×4 IMPLANT
DRAPE LAPAROTOMY TRNSV 102X78 (DRAPE) ×4 IMPLANT
DRAPE UTILITY XL STRL (DRAPES) IMPLANT
ELECT CAUTERY BLADE 6.4 (BLADE) ×4 IMPLANT
ELECT REM PT RETURN 9FT ADLT (ELECTROSURGICAL) ×4
ELECTRODE REM PT RTRN 9FT ADLT (ELECTROSURGICAL) ×2 IMPLANT
GLOVE BIO SURGEON STRL SZ7 (GLOVE) ×4 IMPLANT
GLOVE BIO SURGEON STRL SZ8 (GLOVE) ×4 IMPLANT
GLOVE BIOGEL PI IND STRL 7.0 (GLOVE) ×2 IMPLANT
GLOVE BIOGEL PI IND STRL 8 (GLOVE) ×2 IMPLANT
GLOVE BIOGEL PI INDICATOR 7.0 (GLOVE) ×2
GLOVE BIOGEL PI INDICATOR 8 (GLOVE) ×2
GOWN STRL REUS W/ TWL LRG LVL3 (GOWN DISPOSABLE) ×6 IMPLANT
GOWN STRL REUS W/ TWL XL LVL3 (GOWN DISPOSABLE) ×2 IMPLANT
GOWN STRL REUS W/TWL LRG LVL3 (GOWN DISPOSABLE) ×6
GOWN STRL REUS W/TWL XL LVL3 (GOWN DISPOSABLE) ×2
KIT BASIN OR (CUSTOM PROCEDURE TRAY) ×4 IMPLANT
KIT ROOM TURNOVER OR (KITS) ×4 IMPLANT
MESH HERNIA SYS ULTRAPRO LRG (Mesh General) ×4 IMPLANT
MESH VENTRALEX ST 1-7/10 CRC S (Mesh General) ×4 IMPLANT
NEEDLE HYPO 25GX1X1/2 BEV (NEEDLE) ×4 IMPLANT
NS IRRIG 1000ML POUR BTL (IV SOLUTION) ×4 IMPLANT
PACK GENERAL/GYN (CUSTOM PROCEDURE TRAY) ×4 IMPLANT
PACK SURGICAL SETUP 50X90 (CUSTOM PROCEDURE TRAY) ×4 IMPLANT
PAD ARMBOARD 7.5X6 YLW CONV (MISCELLANEOUS) ×4 IMPLANT
PENCIL BUTTON HOLSTER BLD 10FT (ELECTRODE) ×4 IMPLANT
SPONGE LAP 18X18 X RAY DECT (DISPOSABLE) ×4 IMPLANT
STAPLER VISISTAT 35W (STAPLE) IMPLANT
SUT MNCRL AB 4-0 PS2 18 (SUTURE) ×12 IMPLANT
SUT NOVA 1 T20/GS 25DT (SUTURE) IMPLANT
SUT NOVA NAB DX-16 0-1 5-0 T12 (SUTURE) ×12 IMPLANT
SUT PDS AB 0 CT 36 (SUTURE) ×8 IMPLANT
SUT PDS AB 1 TP1 96 (SUTURE) IMPLANT
SUT PROLENE 1 CT (SUTURE) IMPLANT
SUT SILK 2 0 SH (SUTURE) ×4 IMPLANT
SUT VIC AB 0 CT1 27 (SUTURE)
SUT VIC AB 0 CT1 27XBRD ANBCTR (SUTURE) IMPLANT
SUT VIC AB 2-0 SH 27 (SUTURE) ×6
SUT VIC AB 2-0 SH 27X BRD (SUTURE) ×6 IMPLANT
SUT VIC AB 3-0 54X BRD REEL (SUTURE) IMPLANT
SUT VIC AB 3-0 BRD 54 (SUTURE)
SUT VIC AB 3-0 SH 18 (SUTURE) ×4 IMPLANT
SUT VIC AB 3-0 SH 27 (SUTURE) ×2
SUT VIC AB 3-0 SH 27XBRD (SUTURE) ×2 IMPLANT
SUT VICRYL AB 3 0 TIES (SUTURE) ×4 IMPLANT
SYR BULB 3OZ (MISCELLANEOUS) IMPLANT
SYR CONTROL 10ML LL (SYRINGE) ×4 IMPLANT
TOWEL OR 17X24 6PK STRL BLUE (TOWEL DISPOSABLE) ×4 IMPLANT
TOWEL OR 17X26 10 PK STRL BLUE (TOWEL DISPOSABLE) ×4 IMPLANT
TRAY FOLEY CATH 14FRSI W/METER (CATHETERS) IMPLANT
TUBE CONNECTING 12'X1/4 (SUCTIONS)
TUBE CONNECTING 12X1/4 (SUCTIONS) IMPLANT
YANKAUER SUCT BULB TIP NO VENT (SUCTIONS) IMPLANT

## 2016-07-21 NOTE — H&P (View-Only) (Signed)
Elijah Ford 06/29/2016 4:06 PM Location: [facility] Patient #: [id number] DOB: 08-17-1976 Married / Language: Elijah Ford / Race: White Male  History of Present Illness Elijah Moores A. Levon Boettcher MD; 06/29/2016 4:58 PM) Patient words: Patient returns for follow-up after CT scan is abdomen to evaluate for hernia. He has a left inguinal hernia by CT in 2 small ventral hernias one at his umbilicus and one approximately 3-4 cm above this. He is here today to discuss surgery.               Mid abdominal bulge, evaluate for ventral hernia 3 weeks. No previous surgery. No injury.  EXAM: CT ABDOMEN AND PELVIS WITH CONTRAST  TECHNIQUE: Multidetector CT imaging of the abdomen and pelvis was performed using the standard protocol following bolus administration of intravenous contrast.  CONTRAST: 167mL ISOVUE-300 IOPAMIDOL (ISOVUE-300) INJECTION 61%  COMPARISON: Chest CT 10/17/2014  FINDINGS: Lower chest: Lung bases demonstrate minimal bibasilar linear scarring/ atelectasis.  Hepatobiliary: Within normal.  Pancreas: Within normal.  Spleen: Within normal.  Adrenals/Urinary Tract: Adrenal glands are normal and symmetric. Kidneys are normal size without hydronephrosis or nephrolithiasis. No focal mass. Ureters and bladder are within normal.  Stomach/Bowel: Possible mild wall thickening along the greater curvature of the stomach. Small bowel and appendix are normal. Mild redundancy of the sigmoid colon as the colon is otherwise unremarkable.  Vascular/Lymphatic: Minimal calcified plaque over the common iliac arteries. Vascular structures are otherwise within normal. No significant adenopathy.  Reproductive: Within normal.  Other: Tiny umbilical hernia containing only peritoneal fat. Tiny midline ventral hernia above the umbilicus containing only peritoneal fat. No evidence of inflammatory change or free fluid. Small left inguinal hernia containing only peritoneal  fat.  Musculoskeletal: Minimal degenerate change of the spine and hips.  IMPRESSION: No acute findings in the abdomen/pelvis.  Tiny umbilical hernia containing only peritoneal fat. Tiny midline ventral hernia above the umbilicus containing only fat. Small fat containing left inguinal hernia.  Mild wall thickening along the greater curvature of the stomach which may be due to underdistention, although cannot exclude gastritis.   Electronically Signed By: Marin Olp M.D. On: 06/16/2016 09:17.  The patient is a 40 year old male.   Allergies Elbert Ewings, [place]; 06/29/2016 4:06 PM) No Known Drug Allergies 06/08/2016  Medication History Elbert Ewings, CMA; 06/29/2016 4:07 PM) Allopurinol (300MG  Tablet, Oral) Active. BuPROPion HCl ER (XL) (300MG  Tablet ER 24HR, Oral) Active. FLUoxetine HCl (40MG  Capsule, Oral) Active. Meloxicam (15MG  Tablet, Oral) Active. Omeprazole (40MG  Capsule DR, Oral) Active. HydrOXYzine HCl (50MG  Tablet, Oral) Active. Levocetirizine Dihydrochloride (5MG  Tablet, Oral) Active. Testosterone (Implant) Specific dose unknown - Active. Flonase (50MCG/ACT Suspension, Nasal) Active. Montelukast Sodium (10MG  Tablet, Oral) Active. Zithromax (250MG  Tablet, Oral) Active. Medications Reconciled    Vitals Elbert Ewings CMA; 06/29/2016 4:07 PM) 06/29/2016 4:07 PM Weight: 320 lb Height: 80in Body Surface Area: 2.81 m Body Mass Index: 35.15 kg/m  Temp.: 98.46F(Temporal)  Pulse: 64 (Regular)  BP: 130/80 (Sitting, Left Arm, Standard)      Physical Exam (Willean Schurman A. Dura Mccormack MD; 06/29/2016 4:58 PM)  General Mental Status-Alert. General Appearance-Consistent with stated age. Hydration-Well hydrated. Voice-Normal.  Abdomen Note: not reexamined today  Musculoskeletal Normal Exam - Left-Upper Extremity Strength Normal and Lower Extremity Strength Normal. Normal Exam - Right-Upper Extremity Strength Normal and Lower  Extremity Strength Normal.    Assessment & Plan (Lavoris Canizales A. Neena Beecham MD; 06/29/2016 4:59 PM)  VENTRAL HERNIA (K43.9) Impression: Patient desires repair. Discussed laparoscopic and open approaches with him today as well  as the use of mesh. He is opted for open repair of his ventral hernia 2 and left inguinal hernia with mesh needed. The risk of hernia repair include bleeding, infection, organ injury, bowel injury, bladder injury, nerve injury recurrent hernia, blood clots, worsening of underlying condition, chronic pain, mesh use, open surgery, death, and the need for other operattions. Pt agrees to proceed  VENTRAL HERNIA WITHOUT OBSTRUCTION OR GANGRENE (K43.9)  LEFT INGUINAL HERNIA (K40.90)  Current Plans Pt Education - Pamphlet Given - Hernia Surgery: discussed with patient and provided information. The anatomy & physiology of the abdominal wall was discussed. The pathophysiology of hernias was discussed. Natural history risks without surgery including progeressive enlargement, pain, incarceration, & strangulation was discussed. Contributors to complications such as smoking, obesity, diabetes, prior surgery, etc were discussed.  I feel the risks of no intervention will lead to serious problems that outweigh the operative risks; therefore, I recommended surgery to reduce and repair the hernia. I explained laparoscopic techniques with possible need for an open approach. I noted the probable use of mesh to patch and/or buttress the hernia repair  Risks such as bleeding, infection, abscess, need for further treatment, heart attack, death, and other risks were discussed. I noted a good likelihood this will help address the problem. Goals of post-operative recovery were discussed as well. Possibility that this will not correct all symptoms was explained. I stressed the importance of low-impact activity, aggressive pain control, avoiding constipation, & not pushing through pain to minimize risk of  post-operative chronic pain or injury. Possibility of reherniation especially with smoking, obesity, diabetes, immunosuppression, and other health conditions was discussed. We will work to minimize complications.  An educational handout further explaining the pathology & treatment options was given as well. Questions were answered. The patient expresses understanding & wishes to proceed with surgery.  The anatomy & physiology of the abdominal wall and pelvic floor was discussed. The pathophysiology of hernias in the inguinal and pelvic region was discussed. Natural history risks such as progressive enlargement, pain, incarceration, and strangulation was discussed. Contributors to complications such as smoking, obesity, diabetes, prior surgery, etc were discussed.  I feel the risks of no intervention will lead to serious problems that outweigh the operative risks; therefore, I recommended surgery to reduce and repair the hernia. I explained laparoscopic techniques with possible need for an open approach. I noted usual use of mesh to patch and/or buttress hernia repair  Risks such as bleeding, infection, abscess, need for further treatment, heart attack, death, and other risks were discussed. I noted a good likelihood this will help address the problem. Goals of post-operative recovery were discussed as well. Possibility that this will not correct all symptoms was explained. I stressed the importance of low-impact activity, aggressive pain control, avoiding constipation, & not pushing through pain to minimize risk of post-operative chronic pain or injury. Possibility of reherniation was discussed. We will work to minimize complications.  An educational handout further explaining the pathology & treatment options was given as well. Questions were answered. The patient expresses understanding & wishes to proceed with surgery.  Pt Education - Consent for inguinal hernia - [place]inger: discussed with patient and  provided information. You are being scheduled for surgery - Our schedulers will call you.  You should hear from our office's scheduling department within 5 working days about the location, date, and time of surgery. We try to make accommodations for patient's preferences in scheduling surgery, but sometimes the OR schedule or the surgeon's schedule  prevents [place] from making those accommodations.  If you have not heard from our office [phone]) in 5 working days, call the office and ask for your surgeon's nurse.  If you have other questions about your diagnosis, plan, or surgery, call the office and ask for your surgeon's nurse.

## 2016-07-21 NOTE — Interval H&P Note (Signed)
History and Physical Interval Note:  07/21/2016 2:02 PM  Elijah Ford  has presented today for surgery, with the diagnosis of VENTRAL HERNIA X 2, LEFT INGUINAL HERNIA  The various methods of treatment have been discussed with the patient and family. After consideration of risks, benefits and other options for treatment, the patient has consented to  Procedure(s): REPAIR  VENTRAL  HERNIA X 2 (N/A) REPAIR LEFT INGUINAL HERNIA WITH MESH (Left) INSERTION OF MESH (Left) as a surgical intervention .  The patient's history has been reviewed, patient examined, no change in status, stable for surgery.  I have reviewed the patient's chart and labs.  Questions were answered to the patient's satisfaction.     Ansh Fauble A.

## 2016-07-21 NOTE — Progress Notes (Signed)
Dr. Ninfa Linden notified about patient inability to void after 2 attempts. Fluid intake 900-1041ml. Bladder scan showed total 45ml. He said patient can go home.

## 2016-07-21 NOTE — Anesthesia Procedure Notes (Signed)
Procedure Name: Intubation Date/Time: 07/21/2016 2:17 PM Performed by: Carney Living Pre-anesthesia Checklist: Patient identified, Emergency Drugs available, Suction available, Patient being monitored and Timeout performed Patient Re-evaluated:Patient Re-evaluated prior to inductionOxygen Delivery Method: Circle system utilized Preoxygenation: Pre-oxygenation with 100% oxygen Intubation Type: IV induction Ventilation: Mask ventilation without difficulty Laryngoscope Size: Mac Grade View: Grade II Tube type: Oral Tube size: 8.0 mm Number of attempts: 1 Airway Equipment and Method: Stylet Placement Confirmation: ETT inserted through vocal cords under direct vision,  positive ETCO2 and breath sounds checked- equal and bilateral Secured at: 25 cm Tube secured with: Tape Dental Injury: Teeth and Oropharynx as per pre-operative assessment

## 2016-07-21 NOTE — Transfer of Care (Signed)
Immediate Anesthesia Transfer of Care Note  Patient: Elijah Ford  Procedure(s) Performed: Procedure(s): REPAIR  VENTRAL  HERNIA X 2 (N/A) REPAIR LEFT INGUINAL HERNIA (Left) INSERTION OF MESH (Left)  Patient Location: PACU  Anesthesia Type:General  Level of Consciousness: awake, alert , oriented and patient cooperative  Airway & Oxygen Therapy: Patient Spontanous Breathing and Patient connected to nasal cannula oxygen  Post-op Assessment: Report given to RN, Post -op Vital signs reviewed and stable and Patient moving all extremities  Post vital signs: Reviewed and stable  Last Vitals:  Vitals:   07/21/16 1254  BP: 133/87  Pulse: 93  Resp: 20  Temp: 36.8 C    Last Pain:  Vitals:   07/21/16 1254  TempSrc: Oral         Complications: No apparent anesthesia complications

## 2016-07-21 NOTE — Anesthesia Postprocedure Evaluation (Signed)
Anesthesia Post Note  Patient: Elijah Ford  Procedure(s) Performed: Procedure(s) (LRB): REPAIR  VENTRAL  HERNIA X 2 (N/A) REPAIR LEFT INGUINAL HERNIA (Left) INSERTION OF MESH (Left)  Patient location during evaluation: PACU Anesthesia Type: General Level of consciousness: awake and alert and oriented Pain management: pain level controlled Vital Signs Assessment: post-procedure vital signs reviewed and stable Respiratory status: spontaneous breathing, nonlabored ventilation, respiratory function stable and patient connected to nasal cannula oxygen Cardiovascular status: blood pressure returned to baseline and stable Postop Assessment: no signs of nausea or vomiting Anesthetic complications: no    Last Vitals:  Vitals:   07/21/16 1715 07/21/16 1730  BP: 134/87 129/81  Pulse: 72 72  Resp: 13 13  Temp:      Last Pain:  Vitals:   07/21/16 1254  TempSrc: Oral                 Elijah Ford A.

## 2016-07-21 NOTE — Discharge Instructions (Signed)
[facility] _______[facility] ° °UMBILICAL OR INGUINAL HERNIA REPAIR: POST OP INSTRUCTIONS ° °Always review your discharge instruction sheet given to you by the facility where your surgery was performed. °IF YOU HAVE DISABILITY OR FAMILY LEAVE FORMS, YOU MUST BRING THEM TO THE OFFICE FOR PROCESSING.   °DO NOT GIVE THEM TO YOUR DOCTOR. ° °1. A  prescription for pain medication may be given to you upon discharge.  Take your pain medication as prescribed, if needed.  If narcotic pain medicine is not needed, then you may take acetaminophen (Tylenol) or ibuprofen (Advil) as needed. °2. Take your usually prescribed medications unless otherwise directed. °If you need a refill on your pain medication, please contact your pharmacy.  They will contact our office to request authorization. Prescriptions will not be filled after 5 pm or on week-ends. °3. You should follow a light diet the first 24 hours after arrival home, such as soup and crackers, etc.  Be sure to include lots of fluids daily.  Resume your normal diet the day after surgery. °4.Most patients will experience some swelling and bruising around the umbilicus or in the groin and scrotum.  Ice packs and reclining will help.  Swelling and bruising can take several days to resolve.  °6. It is common to experience some constipation if taking pain medication after surgery.  Increasing fluid intake and taking a stool softener (such as Colace) will usually help or prevent this problem from occurring.  A mild laxative (Milk of Magnesia or Miralax) should be taken according to package directions if there are no bowel movements after 48 hours. °7. Unless discharge instructions indicate otherwise, you may remove your bandages 24-48 hours after surgery, and you may shower at that time.  You may have steri-strips (small skin tapes) in place directly over the incision.  These strips should be left on the skin for 7-10 days.  If your surgeon used skin glue on the  incision, you may shower in 24 hours.  The glue will flake off over the next 2-3 weeks.  Any sutures or staples will be removed at the office during your follow-up visit. °8. ACTIVITIES:  You may resume regular (light) daily activities beginning the next day--such as daily self-care, walking, climbing stairs--gradually increasing activities as tolerated.  You may have sexual intercourse when it is comfortable.  Refrain from any heavy lifting or straining until approved by your doctor. ° °a.You may drive when you are no longer taking prescription pain medication, you can comfortably wear a seatbelt, and you can safely maneuver your car and apply brakes. °b.RETURN TO WORK:   °_____________________________________________ ° °9.You should see your doctor in the office for a follow-up appointment approximately 2-3 weeks after your surgery.  Make sure that you call for this appointment within a day or two after you arrive home to insure a convenient appointment time. °10.OTHER INSTRUCTIONS: _________________________ °   _____________________________________ ° °WHEN TO CALL YOUR DOCTOR: °1. Fever over 101.0 °2. Inability to urinate °3. Nausea and/or vomiting °4. Extreme swelling or bruising °5. Continued bleeding from incision. °6. Increased pain, redness, or drainage from the incision ° °The clinic staff is available to answer your questions during regular business hours.  Please don’t hesitate to call and ask to speak to one of the nurses for clinical concerns.  If you have a medical emergency, go to the nearest emergency room or call 911.  A surgeon from [facility] is always on call at the hospital ° ° °  [address], [address], [place], [place]  [address] ? ° [address] [address], [place], [place]   [address] °[phone] ? [phone] ? FAX [phone] °Web site: [email or web address] °

## 2016-07-21 NOTE — Op Note (Signed)
Preoperative diagnosis: #1 epigastric hernia #2 umbilical hernia #3 left inguinal hernia  Postoperative diagnosis: Same  Procedure: #1 primary repair of epigastric hernia #2 umbilical hernia repair with mesh #3 left inguinal hernia repair with mesh  Surgeon: Erroll Luna M.D.  Anesthesia: Gen. with 0.5% Sensorcaine plain local  EBL: Minimal  Specimens: None  Indications for procedure: The patient presents for repair of the hernia. He was having some discomfort in his abdomen and CT scan was obtained which showed a small hernia, a small epigastric hernia and a left inguinal hernia. I discussed laparoscopic versus open techniques with him. He opted for open repair of his hernias.The risk of hernia repair include bleeding,  Infection,   Recurrence of the hernia,  Mesh use, chronic pain,  Organ injury,  Bowel injury,  Bladder injury,   nerve injury with numbness around the incision,  Death,  and worsening of preexisting  medical problems.  The alternatives to surgery have been discussed as well..  Long term expectations of both operative and non operative treatments have been discussed.   The patient agrees to proceed.    Description of procedure: The patient was met in the holding area and questions are answered. All 3 hernia were marked in the holding area. He is taken back to the operating room placed upon the OR table. After induction of general anesthesia, the abdomen was prepped and draped in a sterile fashion. Timeout was done. He epigastric hernia was addressed first. After reviewing his CT scan and get the preoperatively marked, incision was made about 10 cm above the umbilicus. A 5 mm hernia defect was encountered upon dissection down to the midline. This was opened and then closed with #1 PDS suture given small size. The fascia was also opened around this to better visualize it. There is no incarcerated fat. This was oversewn with #1 PDS. 3-0 Vicryl for Monocryl used to close the  skin.  The umbilical hernia was addressed next. Curvilinear incision was made along the base of the umbilicus. The umbilicus was elevated. A 1 cm umbilical hernia was encountered with pre-peritoneal fat. This was reduced back into the abdominal cavity. 4.3 cm circular patch was used to place a sub-fascial position. The mesh was secured to the fascia with #1 Novafil. The fascia was closed over the mesh with #1 Novafil. 2-0 Vicryl was used to secure the umbilicus the fascia closed the subcutaneous fat. 4 Monocryl was used to close skin.  A left inguinal incision was made at infiltration of the skin with local anesthetic. Dissection was carried down through Scarpa's fascia until the aponeurosis of the external oblique was identified. Local anesthetic was infiltrated but this. Incision was made with a scalpel and Metzenbaum scissors to open the external ring and the external oblique. The cord structures were given a 5 encircled with a quarter-inch Penrose drain. A large indirect defect. This was dissected off the cord and reduced back in the pre-peritoneal space. Ultra Pro hernia system was used with the inner leaflet placed in the pre-rectal space and deployed. The onlay was placed on the flow of the inguinal canal with a slit cut in the mesh to allow passage of the cord structures. This was secured to the pubic tubercle, shelving edge of the inguinal ligament and internal oblique with interrupted #1 Novafil suture. The mesh was closed around the cord with #1 Novafil. There is ample room for the cord exit.The ilioinguinal and iliohypogastric nerves were tethered under the mesh. These were divided th  prevent entrapment.  Hemostasis achieved. External oblique closed with 2-0 Vicryl. 3-0 Vicryl used to approximate Scarpa's fascia and 4 Monocryl was used to close skin. Liquid he's applied to all incisions. All final counts found to be correct. Patient was awoke extubated taken recovery in satisfactory condition

## 2016-07-21 NOTE — Anesthesia Preprocedure Evaluation (Addendum)
Anesthesia Evaluation  Patient identified by MRN, date of birth, ID band Patient awake    Reviewed: Allergy & Precautions, H&P , NPO status , Patient's Chart, lab work & pertinent test results  Airway Mallampati: II  TM Distance: >3 FB Neck ROM: full    Dental  (+) Teeth Intact, Dental Advisory Given   Pulmonary asthma , sleep apnea ,    breath sounds clear to auscultation       Cardiovascular hypertension, Pt. on medications  Rhythm:regular Rate:Normal     Neuro/Psych PSYCHIATRIC DISORDERS Anxiety Depression    GI/Hepatic GERD  Medicated and Controlled,  Endo/Other  obese  Renal/GU      Musculoskeletal  (+) Arthritis ,   Abdominal   Peds  Hematology   Anesthesia Other Findings   Reproductive/Obstetrics                            Anesthesia Physical Anesthesia Plan  ASA: II  Anesthesia Plan: General   Post-op Pain Management:    Induction: Intravenous  Airway Management Planned: Oral ETT  Additional Equipment:   Intra-op Plan:   Post-operative Plan: Extubation in OR  Informed Consent: I have reviewed the patients History and Physical, chart, labs and discussed the procedure including the risks, benefits and alternatives for the proposed anesthesia with the patient or authorized representative who has indicated his/her understanding and acceptance.     Plan Discussed with: CRNA, Anesthesiologist and Surgeon  Anesthesia Plan Comments:         Anesthesia Quick Evaluation

## 2016-07-22 ENCOUNTER — Encounter (HOSPITAL_COMMUNITY): Payer: Self-pay | Admitting: Surgery

## 2016-07-22 MED FILL — OXYCODONE/APAP 5/325 MG TAB: 5-325 | 3 days supply | Qty: 30 | Fill #0

## 2016-07-27 ENCOUNTER — Ambulatory Visit (INDEPENDENT_AMBULATORY_CARE_PROVIDER_SITE_OTHER): Payer: 59 | Admitting: Physician Assistant

## 2016-07-27 VITALS — BP 127/83 | HR 64 | Wt 314.0 lb

## 2016-07-27 DIAGNOSIS — E291 Testicular hypofunction: Secondary | ICD-10-CM

## 2016-07-27 MED ORDER — TESTOSTERONE CYPIONATE 200 MG/ML IM SOLN
200.0000 mg | Freq: Once | INTRAMUSCULAR | Status: AC
  Administered 2016-07-27: 200 mg via INTRAMUSCULAR

## 2016-07-27 NOTE — Progress Notes (Signed)
Patient came into office today for testosterone injection. Denies chest pain, shortness of breath, headaches and problems associated with taking this medication. Patient states he has had no abnornal mood swings. Patient tolerated injection in RUOQ well without complications. Patient did recently have surgery to repair 3 hernias. Patient reports surgery went well and he is having no complications. Patient advised to schedule his next injection for 2 weeks from today.

## 2016-09-09 ENCOUNTER — Encounter: Payer: Self-pay | Admitting: Physician Assistant

## 2016-09-09 ENCOUNTER — Ambulatory Visit (INDEPENDENT_AMBULATORY_CARE_PROVIDER_SITE_OTHER): Payer: 59 | Admitting: Physician Assistant

## 2016-09-09 VITALS — BP 126/75 | HR 106 | Temp 98.4°F | Resp 18 | Wt 312.3 lb

## 2016-09-09 DIAGNOSIS — J4521 Mild intermittent asthma with (acute) exacerbation: Secondary | ICD-10-CM | POA: Diagnosis not present

## 2016-09-09 MED ORDER — PREDNISONE 50 MG PO TABS
ORAL_TABLET | ORAL | 0 refills | Status: DC
Start: 2016-09-09 — End: 2016-12-14

## 2016-09-09 MED ORDER — HYDROCOD POLST-CPM POLST ER 10-8 MG/5ML PO SUER: 5.0000 mL | Freq: Two times a day (BID) | ORAL | 0 refills | Status: DC | PRN

## 2016-09-09 MED ORDER — AZITHROMYCIN 250 MG PO TABS: ORAL_TABLET | ORAL | 0 refills | Status: DC

## 2016-09-09 MED FILL — AZITHROMYCIN 250 MG TABLET: 250 | 5 days supply | Qty: 6 | Fill #0

## 2016-09-09 MED FILL — HYDROCODONE-CHLORPHENIRAM S: 10-8 | 12 days supply | Qty: 120 | Fill #0

## 2016-09-09 MED FILL — predniSONE 50 MG TABS: 50 | 5 days supply | Qty: 5 | Fill #0

## 2016-09-09 NOTE — Patient Instructions (Signed)

## 2016-09-10 ENCOUNTER — Encounter: Payer: Self-pay | Admitting: Physician Assistant

## 2016-09-10 NOTE — Progress Notes (Addendum)
   Subjective:    Patient ID: Elijah Ford, male    DOB: 1975-12-25, 40 y.o.   MRN: [id number]  HPI Pt is a 40 yo male with hx of asthma and pneumonia with 6 days hx of cough. Cough is productive with sputum. His chest is tight with some improvement with albuterol. No fever, chills. He is very weak. Cannot sleep at night due to cough. Ears have some pressure. No ST, sinus pressure, headache. He is wheezing and SOB. He is having trouble walking the floors at work.    Review of Systems  All other systems reviewed and are negative.      Objective:   Physical Exam  Constitutional: He appears well-developed and well-nourished.  HENT:  Head: Normocephalic and atraumatic.  Right Ear: External ear normal.  Left Ear: External ear normal.  Nose: Nose normal.  Mouth/Throat: Oropharynx is clear and moist. No oropharyngeal exudate.  Eyes: Conjunctivae are normal.  Neck: Normal range of motion. Neck supple.  Cardiovascular: Normal rate, regular rhythm and normal heart sounds.   Pulmonary/Chest:  Decreased effort due to persisent productive cough. Bilateral lung wheezing and rhonchi.   Lymphadenopathy:    He has no cervical adenopathy.          Assessment & Plan:  .Marland KitchenNaser was seen today for uri.  Diagnoses and all orders for this visit:  Mild intermittent asthmatic bronchitis with acute exacerbation -     predniSONE (DELTASONE) 50 MG tablet; Take one tablet daily for 5 days. -     azithromycin (ZITHROMAX) 250 MG tablet; Take 2 tablets now and the one table for 4 days. -     chlorpheniramine-HYDROcodone ([place]) 10-8 MG/5ML SUER; Take 5 mLs by mouth every 12 (twelve) hours as needed for cough (cough, will cause drowsiness.).   Discussed symptomatic care. Follow up as needed. Encouraged to use rescue inhaler every 2 hours as needed.

## 2016-09-26 MED FILL — ALLOPURINOL 300 MG TABLET: 300 | 90 days supply | Qty: 90 | Fill #1

## 2016-09-26 MED FILL — BUPROPION HCL XL 300 MG TAB: 300 | 90 days supply | Qty: 90 | Fill #1

## 2016-10-03 ENCOUNTER — Other Ambulatory Visit: Payer: Self-pay | Admitting: Physician Assistant

## 2016-10-04 ENCOUNTER — Other Ambulatory Visit: Payer: Self-pay | Admitting: *Deleted

## 2016-10-04 MED ORDER — OMEPRAZOLE 40 MG PO CPDR: 40.0000 mg | DELAYED_RELEASE_CAPSULE | Freq: Every day | ORAL | 4 refills | Status: DC

## 2016-10-04 MED FILL — OMEPRAZOLE DR 40 MG CAPSULE: 40 | 90 days supply | Qty: 90 | Fill #0

## 2016-10-24 MED FILL — FLUoxetine HCL 40 MG CAPS: 40 | 90 days supply | Qty: 90 | Fill #1

## 2016-11-07 ENCOUNTER — Telehealth: Payer: Self-pay | Admitting: Physician Assistant

## 2016-11-07 MED ORDER — OSELTAMIVIR PHOSPHATE 75 MG PO CAPS: 75.0000 mg | ORAL_CAPSULE | Freq: Every day | ORAL | 0 refills | Status: DC

## 2016-11-07 NOTE — Telephone Encounter (Signed)
Pt's 42 year old child has been dx with the flu. He lives in house with them. Will give household exposure prevention.

## 2016-12-14 ENCOUNTER — Ambulatory Visit (INDEPENDENT_AMBULATORY_CARE_PROVIDER_SITE_OTHER): Payer: 59 | Admitting: Sports Medicine

## 2016-12-14 DIAGNOSIS — M722 Plantar fascial fibromatosis: Secondary | ICD-10-CM

## 2016-12-14 DIAGNOSIS — M19071 Primary osteoarthritis, right ankle and foot: Secondary | ICD-10-CM | POA: Diagnosis not present

## 2016-12-14 DIAGNOSIS — M19072 Primary osteoarthritis, left ankle and foot: Secondary | ICD-10-CM | POA: Diagnosis not present

## 2016-12-14 MED ORDER — MELOXICAM 15 MG PO TABS: 15.0000 mg | ORAL_TABLET | Freq: Every day | ORAL | 3 refills | Status: DC

## 2016-12-14 NOTE — Assessment & Plan Note (Signed)
Discussed the pathophysiology, added an extra scaphoid pad in both orthotics. I do think at this point he needs to come in for a new set of orthotics as they are starting to break down.

## 2016-12-14 NOTE — Assessment & Plan Note (Signed)
Today there is some pain on the right side at the metatarsophalangeal joints. Adding meloxicam, return for new set of orthotics.

## 2016-12-14 NOTE — Progress Notes (Signed)
   Subjective:    I'm seeing this patient as a consultation for:  Iran Planas, PA-C  CC: Bilateral foot pain  HPI: This is a pleasant 41 year old male, comes in with a new and slightly tender knot on the plantar aspect of his left foot, he does have known plantar fascia at his. In addition he has some pain over the dorsal metatarsophalangeal joints on his right foot. He does wear his custom orthotics religiously and they have improved his pain a lot. He does take an occasional Motrin for his foot pain which helps significantly.  Past medical history:  Negative.  See flowsheet/record as well for more information.  Surgical history: Negative.  See flowsheet/record as well for more information.  Family history: Negative.  See flowsheet/record as well for more information.  Social history: Negative.  See flowsheet/record as well for more information.  Allergies, and medications have been entered into the medical record, reviewed, and no changes needed.   Review of Systems: No headache, visual changes, nausea, vomiting, diarrhea, constipation, dizziness, abdominal pain, skin rash, fevers, chills, night sweats, weight loss, swollen lymph nodes, body aches, joint swelling, muscle aches, chest pain, shortness of breath, mood changes, visual or auditory hallucinations.   Objective:   General: Well Developed, well nourished, and in no acute distress.  Neuro/Psych: Alert and oriented x3, extra-ocular muscles intact, able to move all 4 extremities, sensation grossly intact. Skin: Warm and dry, no rashes noted.  Respiratory: Not using accessory muscles, speaking in full sentences, trachea midline.  Cardiovascular: Pulses palpable, no extremity edema. Abdomen: Does not appear distended. Bilateral feet: No visible erythema or swelling. Range of motion is full in all directions. Strength is 5/5 in all directions. No hallux valgus. No pes cavus or pes planus. No abnormal callus noted. No pain over  the navicular prominence, or base of fifth metatarsal. No tenderness to palpation of the calcaneal insertion of plantar fascia. No pain at the Achilles insertion. No pain over the calcaneal bursa. No pain of the retrocalcaneal bursa. Left side with plantar fascia fibromatosis that minimally tender No tenderness to palpation over the tarsals, metatarsals, or phalanges. No hallux rigidus or limitus. No tenderness palpation over interphalangeal joints. No pain with compression of the metatarsal heads. Neurovascularly intact distally.  Adding scaphoid pad to both orthotics.  Impression and Recommendations:   This case required medical decision making of moderate complexity.  Plantar fascial fibromatosis of left foot Discussed the pathophysiology, added an extra scaphoid pad in both orthotics. I do think at this point he needs to come in for a new set of orthotics as they are starting to break down.  Tarsometatarsal osteoarthritis of both feet Today there is some pain on the right side at the metatarsophalangeal joints. Adding meloxicam, return for new set of orthotics.

## 2016-12-22 ENCOUNTER — Other Ambulatory Visit: Payer: Self-pay | Admitting: Physician Assistant

## 2016-12-22 MED FILL — BUPROPION HCL XL 300 MG TAB: 300 | 90 days supply | Qty: 90 | Fill #0

## 2016-12-22 MED FILL — MELOXICAM 15 MG TABLET: 15 | 30 days supply | Qty: 30 | Fill #0

## 2016-12-30 ENCOUNTER — Encounter: Payer: Self-pay | Admitting: Sports Medicine

## 2016-12-30 ENCOUNTER — Ambulatory Visit (INDEPENDENT_AMBULATORY_CARE_PROVIDER_SITE_OTHER): Payer: 59 | Admitting: Sports Medicine

## 2016-12-30 DIAGNOSIS — M722 Plantar fascial fibromatosis: Secondary | ICD-10-CM | POA: Diagnosis not present

## 2016-12-30 NOTE — Assessment & Plan Note (Signed)
Custom orthotics with an aggressive arch.

## 2016-12-30 NOTE — Progress Notes (Signed)
    Patient was fitted for a : standard, cushioned, semi-rigid orthotic. The orthotic was heated and afterward the patient stood on the orthotic blank positioned on the orthotic stand. The patient was positioned in subtalar neutral position and 10 degrees of ankle dorsiflexion in a weight bearing stance. After completion of molding, a stable base was applied to the orthotic blank. The blank was ground to a stable position for weight bearing. Size: 16 Base: [employer] and safety inspector and Padding: None The patient ambulated these, and they were very comfortable.  I spent 40 minutes with this patient, greater than 50% was face-to-face time counseling regarding the below diagnosis.

## 2017-01-16 ENCOUNTER — Other Ambulatory Visit: Payer: Self-pay | Admitting: Physician Assistant

## 2017-01-16 MED FILL — OMEPRAZOLE DR 40 MG CAPSULE: 40 | 90 days supply | Qty: 90 | Fill #1

## 2017-01-16 MED FILL — MELOXICAM 15 MG TABLET: 15 | 30 days supply | Qty: 30 | Fill #1

## 2017-01-17 MED FILL — ALLOPURINOL 300 MG TABLET: 300 | 30 days supply | Qty: 30 | Fill #0

## 2017-01-17 MED FILL — FLUoxetine HCL 40 MG CAPS: 40 | 30 days supply | Qty: 30 | Fill #0

## 2017-02-14 ENCOUNTER — Other Ambulatory Visit: Payer: Self-pay | Admitting: Physician Assistant

## 2017-02-14 MED FILL — ALLOPURINOL 300 MG TABLET: 300 | 30 days supply | Qty: 30 | Fill #0

## 2017-02-14 MED FILL — MELOXICAM 15 MG TABLET: 15 | 30 days supply | Qty: 30 | Fill #2

## 2017-02-24 ENCOUNTER — Other Ambulatory Visit: Payer: Self-pay | Admitting: Physician Assistant

## 2017-02-24 MED FILL — FLUoxetine HCL 40 MG CAPS: 40 | 90 days supply | Qty: 90 | Fill #0

## 2017-03-02 ENCOUNTER — Ambulatory Visit (INDEPENDENT_AMBULATORY_CARE_PROVIDER_SITE_OTHER): Payer: 59

## 2017-03-02 ENCOUNTER — Encounter: Payer: Self-pay | Admitting: Sports Medicine

## 2017-03-02 ENCOUNTER — Ambulatory Visit (INDEPENDENT_AMBULATORY_CARE_PROVIDER_SITE_OTHER): Payer: 59 | Admitting: Sports Medicine

## 2017-03-02 DIAGNOSIS — Z8701 Personal history of pneumonia (recurrent): Secondary | ICD-10-CM

## 2017-03-02 DIAGNOSIS — J45909 Unspecified asthma, uncomplicated: Secondary | ICD-10-CM

## 2017-03-02 DIAGNOSIS — J4540 Moderate persistent asthma, uncomplicated: Secondary | ICD-10-CM | POA: Diagnosis not present

## 2017-03-02 MED ORDER — HYDROCOD POLST-CPM POLST ER 10-8 MG/5ML PO SUER: 5.0000 mL | Freq: Two times a day (BID) | ORAL | 0 refills | Status: DC | PRN

## 2017-03-02 MED ORDER — ALBUTEROL SULFATE (2.5 MG/3ML) 0.083% IN NEBU: 2.5000 mg | INHALATION_SOLUTION | Freq: Four times a day (QID) | RESPIRATORY_TRACT | 6 refills | Status: DC | PRN

## 2017-03-02 MED ORDER — PREDNISONE 50 MG PO TABS: 50.0000 mg | ORAL_TABLET | Freq: Every day | ORAL | 0 refills | Status: DC

## 2017-03-02 MED ORDER — AZITHROMYCIN 250 MG PO TABS: ORAL_TABLET | ORAL | 0 refills | Status: DC

## 2017-03-02 MED FILL — ALBUTEROL 0.083% INHAL SOLN: (2.5 MG/3ML | 6 days supply | Qty: 75 | Fill #0

## 2017-03-02 MED FILL — predniSONE 50 MG TABS: 50 | 5 days supply | Qty: 5 | Fill #0

## 2017-03-02 MED FILL — AZITHROMYCIN 250 MG TABLET: 250 | 5 days supply | Qty: 6 | Fill #0

## 2017-03-02 NOTE — Assessment & Plan Note (Signed)
In acute exacerbation, crackles in the left lung fields, diffuse expiratory wheezes. Azithromycin, prednisone, chest x-ray, Tussionex. Return to see me in 2 weeks.

## 2017-03-02 NOTE — Progress Notes (Signed)
  Subjective:    CC: congestion, sinus pressure  HPI: Mr. Elijah Ford is a 41 y.o. Male with a history of mild intermittent asthmatic bronchitis who presents with congestion and sinus pressure. He started to feel throat itchiness yesterday that developed into the congestion and sinus pressure. Pt reports bilateral ear fullness as well as chills, but he did not take a temperature and was afebrile on presentation. Pt denies NVD. He reports some chest discomfort, but denies SOB. He had some coughing last night, and did a single nebulizer treatment last night which helped. He has also taken Nyquil, but has not tried any other treatments.  Past medical history:  Mild intermittent asthmatic bronchitis.  See flowsheet/record as well for more information.  Surgical history: Negative.  See flowsheet/record as well for more information.  Family history: Negative.  See flowsheet/record as well for more information.  Social history: Negative.  See flowsheet/record as well for more information.  Allergies, and medications have been entered into the medical record, reviewed, and no changes needed.   Review of Systems: Subjective fever, chills, night sweats; no weight loss, chest pain, NVD or shortness of breath.  Objective:    General: Well Developed, well nourished, and in no acute distress. Neuro: Alert and oriented x3, extra-ocular muscles intact, sensation grossly intact.  HEENT: Normocephalic, atraumatic, pupils equal round reactive to light; TMs unremarkable, right ear canal slightly erythematous; turbinates erythematous and swollen bilaterally; pharynx erythematous; no frontal sinus pressure but mild maxillary and ethmoid sinus pressure Skin: Skin is cool and diaphoretic, no rashes. Cardiac: Regular rate and rhythm, no murmurs rubs or gallops, no lower extremity edema.  Respiratory: Diffuse expiratory wheezes; crackles in left upper quadrant; Not using accessory muscles, speaking in full  sentences.   Impression and Recommendations:    Mr. Elijah Ford is a 41 y.o. Male with a history of mild intermittent asthmatic bronchitis who presents in acute exacerbation.  Exam significant for crackles in left lung, diffuse expiratory wheezes, diaphoresis. Worrisome for possible pneumonia. CXR ordered. There is some degree of sinusitis as well. Azithromycin, prednisone, tussonex prescribed.  Follow-up in 2 weeks.

## 2017-03-16 ENCOUNTER — Ambulatory Visit: Payer: 59 | Admitting: Sports Medicine

## 2017-03-24 ENCOUNTER — Other Ambulatory Visit: Payer: Self-pay | Admitting: Physician Assistant

## 2017-03-24 MED FILL — ALLOPURINOL 300 MG TABLET: 300 | 30 days supply | Qty: 30 | Fill #0

## 2017-03-24 MED FILL — BUPROPION HCL XL 300 MG TAB: 300 | 90 days supply | Qty: 90 | Fill #1

## 2017-03-24 MED FILL — MELOXICAM 15 MG TABLET: 15 | 30 days supply | Qty: 30 | Fill #3

## 2017-04-10 MED FILL — OMEPRAZOLE DR 40 MG CAPSULE: 40 | 90 days supply | Qty: 90 | Fill #2

## 2017-04-20 ENCOUNTER — Other Ambulatory Visit: Payer: Self-pay | Admitting: Physician Assistant

## 2017-04-20 ENCOUNTER — Other Ambulatory Visit: Payer: Self-pay | Admitting: Sports Medicine

## 2017-04-20 DIAGNOSIS — M19072 Primary osteoarthritis, left ankle and foot: Principal | ICD-10-CM

## 2017-04-20 DIAGNOSIS — M19071 Primary osteoarthritis, right ankle and foot: Secondary | ICD-10-CM

## 2017-04-20 MED FILL — MELOXICAM 15 MG TABLET: 15 | 30 days supply | Qty: 30 | Fill #2

## 2017-04-20 MED FILL — ALLOPURINOL 300 MG TABLET: 300 | 30 days supply | Qty: 30 | Fill #0

## 2017-05-05 ENCOUNTER — Ambulatory Visit (INDEPENDENT_AMBULATORY_CARE_PROVIDER_SITE_OTHER): Payer: 59 | Admitting: Sports Medicine

## 2017-05-05 ENCOUNTER — Encounter: Payer: Self-pay | Admitting: Physician Assistant

## 2017-05-05 ENCOUNTER — Encounter: Payer: Self-pay | Admitting: Sports Medicine

## 2017-05-05 ENCOUNTER — Ambulatory Visit (INDEPENDENT_AMBULATORY_CARE_PROVIDER_SITE_OTHER): Payer: 59 | Admitting: Physician Assistant

## 2017-05-05 VITALS — BP 128/74 | HR 76 | Ht >= 80 in | Wt 324.0 lb

## 2017-05-05 DIAGNOSIS — M19072 Primary osteoarthritis, left ankle and foot: Secondary | ICD-10-CM | POA: Diagnosis not present

## 2017-05-05 DIAGNOSIS — M19071 Primary osteoarthritis, right ankle and foot: Secondary | ICD-10-CM

## 2017-05-05 DIAGNOSIS — F331 Major depressive disorder, recurrent, moderate: Secondary | ICD-10-CM

## 2017-05-05 DIAGNOSIS — M84375A Stress fracture, left foot, initial encounter for fracture: Secondary | ICD-10-CM

## 2017-05-05 MED ORDER — VORTIOXETINE HBR 10 MG PO TABS: ORAL_TABLET | ORAL | 1 refills | Status: DC

## 2017-05-05 MED ORDER — FLUOXETINE HCL 20 MG PO TABS: 20.0000 mg | ORAL_TABLET | Freq: Every day | ORAL | 0 refills | Status: DC

## 2017-05-05 MED FILL — TRINTELLIX 10 MG TABLET: 10 | 30 days supply | Qty: 30 | Fill #0

## 2017-05-05 MED FILL — FLUoxetine HCL 20 MG TABS: 20 | 7 days supply | Qty: 7 | Fill #0

## 2017-05-05 NOTE — Assessment & Plan Note (Signed)
Right tarsometatarsal injection. Return in one month for this.

## 2017-05-05 NOTE — Progress Notes (Signed)
Subjective:    CC: Bilateral foot pain  HPI: Elijah Ford is a pleasant 41 year old male, he has known tarsometatarsal osteoarthritis, the last injection was over 6 months ago. He now has a recurrence of pain over the dorsum of the tarsometatarsal joint on the right foot. He also has pain over the dorsum of the forefoot on the left over the metatarsal shafts. As well as a recurrence of pain at his left plantar fascia fibromatosis nodule. Symptoms are moderate, persistent.  Past medical history:  Negative.  See flowsheet/record as well for more information.  Surgical history: Negative.  See flowsheet/record as well for more information.  Family history: Negative.  See flowsheet/record as well for more information.  Social history: Negative.  See flowsheet/record as well for more information.  Allergies, and medications have been entered into the medical record, reviewed, and no changes needed.   Review of Systems: No fevers, chills, night sweats, weight loss, chest pain, or shortness of breath.   Objective:    General: Well Developed, well nourished, and in no acute distress.  Neuro: Alert and oriented x3, extra-ocular muscles intact, sensation grossly intact.  HEENT: Normocephalic, atraumatic, pupils equal round reactive to light, neck supple, no masses, no lymphadenopathy, thyroid nonpalpable.  Skin: Warm and dry, no rashes. Cardiac: Regular rate and rhythm, no murmurs rubs or gallops, no lower extremity edema.  Respiratory: Clear to auscultation bilaterally. Not using accessory muscles, speaking in full sentences. Right Foot: Visibly swollen midfoot, tenderness over the tarsometatarsal joints Range of motion is full in all directions. Strength is 5/5 in all directions. No hallux valgus. No pes cavus or pes planus. No abnormal callus noted. No pain over the navicular prominence, or base of fifth metatarsal. No tenderness to palpation of the calcaneal insertion of plantar fascia. No pain  at the Achilles insertion. No pain over the calcaneal bursa. No pain of the retrocalcaneal bursa. No tenderness to palpation over the tarsals, metatarsals, or phalanges. No hallux rigidus or limitus. No tenderness palpation over interphalangeal joints. No pain with compression of the metatarsal heads. Neurovascularly intact distally. Left Foot: No visible erythema or swelling. Range of motion is full in all directions. Strength is 5/5 in all directions. No hallux valgus. No pes cavus or pes planus. No abnormal callus noted. No pain over the navicular prominence, or base of fifth metatarsal. No tenderness to palpation of the calcaneal insertion of plantar fascia. No pain at the Achilles insertion. No pain over the calcaneal bursa. No pain of the retrocalcaneal bursa. Tenderness over the second through fourth metatarsal shaft dorsally No hallux rigidus or limitus. No tenderness palpation over interphalangeal joints. No pain with compression of the metatarsal heads. Neurovascularly intact distally.  Procedure: Real-time Ultrasound Guided Injection of right tarsometatarsal joint Device: GE Logiq E  Verbal informed consent obtained.  Time-out conducted.  Noted no overlying erythema, induration, or other signs of local infection.  Skin prepped in a sterile fashion.  Local anesthesia: Topical Ethyl chloride.  With sterile technique and under real time ultrasound guidance:  1 mL Kenalog 3, 1 mL lidocaine injected easily Completed without difficulty  Pain immediately resolved suggesting accurate placement of the medication.  Advised to call if fevers/chills, erythema, induration, drainage, or persistent bleeding.  Images permanently stored and available for review in the ultrasound unit.  Impression: Technically successful ultrasound guided injection.  Impression and Recommendations:    Tarsometatarsal osteoarthritis of both feet Right tarsometatarsal injection. Return in one month  for this.  Metatarsal stress fracture of  left foot With pain over the dorsum of the second through fourth metatarsal shafts. Still has some pain on the plantar fascia fibromatosis on this foot. Due to the stress injury we are going to do a postop shoe, x-rays. If persistent symptoms after 6 weeks we will get an MRI.

## 2017-05-05 NOTE — Assessment & Plan Note (Signed)
With pain over the dorsum of the second through fourth metatarsal shafts. Still has some pain on the plantar fascia fibromatosis on this foot. Due to the stress injury we are going to do a postop shoe, x-rays. If persistent symptoms after 6 weeks we will get an MRI.

## 2017-05-07 NOTE — Progress Notes (Signed)
   Subjective:    Patient ID: Elijah Ford, male    DOB: [birth year], 41 y.o.   MRN: [id number]  HPI Pt is a 41 yo male who presents to the clinic to discuss worsening depression. PMH positive for ongoing depression since teenager. He has been on prozac for many years and has seemed to help a lot but has not been recently. We added wellbutrin about one year ago which also helped. He has had numerous things that have aided in depression. His daughters are not talking to him. His job is very stressful and despite giving it 100 percent he is not meeting expectations. He walks all day long and now has a stress fracture which makes his chronic foot pain worse. He denies any suicidal or homicidal thoughts. He is seeing a Social worker every week.    Review of Systems    see HPI.  Objective:   Physical Exam  Constitutional: He is oriented to person, place, and time. He appears well-developed and well-nourished.  Cardiovascular: Normal rate, regular rhythm and normal heart sounds.   Neurological: He is alert and oriented to person, place, and time.  Psychiatric: He has a normal mood and affect. His behavior is normal.          Assessment & Plan:  .Marland KitchenKirk was seen today for depression.  Diagnoses and all orders for this visit:  Moderate episode of recurrent major depressive disorder ([place])  Other orders -     vortioxetine HBr (TRINTELLIX) 10 MG TABS; Take one half tablet for 7 days then increase to one full tablet daily. -     FLUoxetine (PROZAC) 20 MG tablet; Take 1 tablet (20 mg total) by mouth daily.   Discussed cut prozac in halft(gave half dose tablets) for 7 days with 1/2 trintellix. Then increase to 10mg  daily. Discussed side effects. Continue on same dose of wellbutrin for now. We consider cutting in half if we increase the dose of trintellix. Follow up in 4 to 6 weeks.   .. Depression screen [facility] 2/9 05/05/2017 12/19/2013 11/04/2013  Decreased Interest 3 1 1   Down, Depressed, Hopeless 3  2 1   PHQ - 2 Score 6 3 2   Altered sleeping 3 1 2   Tired, decreased energy 3 1 1   Change in appetite 3 0 1  Feeling bad or failure about yourself  3 3 3   Trouble concentrating 3 3 1   Moving slowly or fidgety/restless 2 2 1   Suicidal thoughts 0 0 0  PHQ-9 Score 23 13 11    .Marland Kitchen

## 2017-05-09 ENCOUNTER — Ambulatory Visit (INDEPENDENT_AMBULATORY_CARE_PROVIDER_SITE_OTHER): Payer: 59 | Admitting: Sports Medicine

## 2017-05-09 ENCOUNTER — Encounter: Payer: Self-pay | Admitting: Sports Medicine

## 2017-05-09 ENCOUNTER — Ambulatory Visit (INDEPENDENT_AMBULATORY_CARE_PROVIDER_SITE_OTHER): Payer: 59

## 2017-05-09 DIAGNOSIS — M19071 Primary osteoarthritis, right ankle and foot: Secondary | ICD-10-CM

## 2017-05-09 DIAGNOSIS — M19072 Primary osteoarthritis, left ankle and foot: Secondary | ICD-10-CM

## 2017-05-09 DIAGNOSIS — M79671 Pain in right foot: Secondary | ICD-10-CM

## 2017-05-09 DIAGNOSIS — M84376D Stress fracture, unspecified foot, subsequent encounter for fracture with routine healing: Secondary | ICD-10-CM

## 2017-05-09 DIAGNOSIS — M84375A Stress fracture, left foot, initial encounter for fracture: Secondary | ICD-10-CM

## 2017-05-09 MED ORDER — AMBULATORY NON FORMULARY MEDICATION: 0 refills | Status: DC

## 2017-05-09 NOTE — Progress Notes (Signed)
   Subjective:    I'm seeing this patient as a consultation for:  Iran Planas, PA-C  CC: right foot pain  HPI: I recently saw Elijah Ford for right dorsal midfoot pain consistent with osteoarthritis, this was injected and pain at this location has resolved. I also suspected a left metatarsal stress injury, he's been in a postop shoe since then, and doing better. 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 Moderate, persistent without radiation.  Past medical history, Surgical history, Family history not pertinant except as noted below, Social history, Allergies, and medications have been entered into the medical record, reviewed, and no changes needed.   Review of Systems: No headache, visual changes, nausea, vomiting, diarrhea, constipation, dizziness, abdominal pain, skin rash, fevers, chills, night sweats, weight loss, swollen lymph nodes, body aches, joint swelling, muscle aches, chest pain, shortness of breath, mood changes, visual or auditory hallucinations.   Objective:   General: Well Developed, well nourished, and in no acute distress.  Neuro:  Extra-ocular muscles intact, able to move all 4 extremities, sensation grossly intact.  Deep tendon reflexes tested were normal. Psych: Alert and oriented, mood congruent with affect. ENT:  Ears and nose appear unremarkable.  Hearing grossly normal. Neck: Unremarkable overall appearance, trachea midline.  No visible thyroid enlargement. Eyes: Conjunctivae and lids appear unremarkable.  Pupils equal and round. Skin: Warm and dry, no rashes noted.  Cardiovascular: Pulses palpable, no extremity edema. Right Foot: No visible erythema or swelling. Range of motion is full in all directions. Strength is 5/5 in all directions. No hallux valgus. No pes cavus or pes planus. No abnormal callus noted. No pain over the navicular prominence, or base of fifth  metatarsal. No tenderness to palpation of the calcaneal insertion of plantar fascia. No pain at the Achilles insertion. No pain over the calcaneal bursa. No pain of the retrocalcaneal bursa. Tender to palpation over the second through fourth metatarsal shaft dorsally No hallux rigidus or limitus. No longer has any tenderness over the tarsometatarsal joint. No pain with compression of the metatarsal heads. Neurovascularly intact distally.  X-rays do show some suggestion of periosteal reaction over the fourth metatarsal shaft on the right foot and the second metatarsal shaft on the left foot.  Impression and Recommendations:   This case required medical decision making of moderate complexity.  Tarsometatarsal osteoarthritis of both feet Dorsal midfoot pain is resolved after right tarsometatarsal injection at the last visit.  Bilateral metatarsal stress injury Continue postop shoe on the left. He seems to a developed a stress injury on the right foot as well. He will alternate his postop shoe daily, I am going to have him do seated duty for 2 weeks, I am going to write a prescription for a rolling knee scooter. Bilateral x-rays. Return in one month.

## 2017-05-09 NOTE — Assessment & Plan Note (Signed)
Dorsal midfoot pain is resolved after right tarsometatarsal injection at the last visit.

## 2017-05-09 NOTE — Assessment & Plan Note (Signed)
Continue postop shoe on the left. He seems to a developed a stress injury on the right foot as well. He will alternate his postop shoe daily, I am going to have him do seated duty for 2 weeks, I am going to write a prescription for a rolling knee scooter. Bilateral x-rays. Return in one month.

## 2017-05-30 ENCOUNTER — Other Ambulatory Visit: Payer: Self-pay | Admitting: Physician Assistant

## 2017-05-30 ENCOUNTER — Telehealth: Payer: Self-pay | Admitting: Physician Assistant

## 2017-05-30 DIAGNOSIS — F331 Major depressive disorder, recurrent, moderate: Secondary | ICD-10-CM

## 2017-05-30 MED ORDER — FLUOXETINE HCL 20 MG PO TABS: 20.0000 mg | ORAL_TABLET | Freq: Every day | ORAL | 0 refills | Status: DC

## 2017-05-30 MED ORDER — ALLOPURINOL 300 MG PO TABS: ORAL_TABLET | ORAL | 4 refills | Status: DC

## 2017-05-30 MED ORDER — FLUOXETINE HCL 40 MG PO CAPS: 40.0000 mg | ORAL_CAPSULE | Freq: Every day | ORAL | 0 refills | Status: DC

## 2017-05-30 MED FILL — MELOXICAM 15 MG TABLET: 15 | 30 days supply | Qty: 30 | Fill #3

## 2017-05-30 MED FILL — ALLOPURINOL 300 MG TABLET: 300 | 30 days supply | Qty: 30 | Fill #0

## 2017-05-30 MED FILL — FLUoxetine HCL 40 MG CAPS: 40 | 90 days supply | Qty: 90 | Fill #0

## 2017-05-30 MED FILL — FLUoxetine HCL 20 MG TABS: 20 | 10 days supply | Qty: 10 | Fill #0

## 2017-05-30 NOTE — Telephone Encounter (Signed)
Sent!

## 2017-05-30 NOTE — Telephone Encounter (Signed)
Pt is requesting a refill on allopurinol. Also, he would like to be put back on Prozac raised to 40 mg instead of taking the new medication he was put on last visit. Thanks!

## 2017-05-30 NOTE — Telephone Encounter (Signed)
Pt would like to come off trintellix. We had previously discussed just increasing prozac now he would like to go through with that plan. He did not like how moody he felt on trintellix.

## 2017-06-05 ENCOUNTER — Ambulatory Visit: Payer: 59 | Admitting: Family Medicine

## 2017-06-06 ENCOUNTER — Ambulatory Visit: Payer: 59 | Admitting: Sports Medicine

## 2017-06-27 ENCOUNTER — Other Ambulatory Visit: Payer: Self-pay | Admitting: Physician Assistant

## 2017-06-27 MED FILL — OMEPRAZOLE DR 40 MG CAPSULE: 40 | 90 days supply | Qty: 90 | Fill #3

## 2017-06-27 MED FILL — ALLOPURINOL 300 MG TABS: 300 | 90 days supply | Qty: 90 | Fill #0

## 2017-06-28 ENCOUNTER — Other Ambulatory Visit: Payer: Self-pay | Admitting: *Deleted

## 2017-06-28 MED ORDER — BUPROPION HCL ER (XL) 300 MG PO TB24: ORAL_TABLET | ORAL | 0 refills | Status: DC

## 2017-06-28 MED ORDER — OMEPRAZOLE 40 MG PO CPDR: 40.0000 mg | DELAYED_RELEASE_CAPSULE | Freq: Every day | ORAL | 4 refills | Status: DC

## 2017-06-28 MED ORDER — ALLOPURINOL 300 MG PO TABS: ORAL_TABLET | ORAL | 4 refills | Status: DC

## 2017-06-28 MED FILL — BUPROPION HCL XL 300 MG TAB: 300 | 90 days supply | Qty: 90 | Fill #0

## 2017-07-12 ENCOUNTER — Encounter: Payer: Self-pay | Admitting: Physician Assistant

## 2017-07-12 ENCOUNTER — Ambulatory Visit (INDEPENDENT_AMBULATORY_CARE_PROVIDER_SITE_OTHER): Payer: 59 | Admitting: Physician Assistant

## 2017-07-12 DIAGNOSIS — Z23 Encounter for immunization: Secondary | ICD-10-CM | POA: Diagnosis not present

## 2017-07-13 ENCOUNTER — Other Ambulatory Visit: Payer: Self-pay | Admitting: Physician Assistant

## 2017-07-13 DIAGNOSIS — Z79899 Other long term (current) drug therapy: Secondary | ICD-10-CM

## 2017-07-13 DIAGNOSIS — Z0000 Encounter for general adult medical examination without abnormal findings: Secondary | ICD-10-CM

## 2017-07-13 DIAGNOSIS — E291 Testicular hypofunction: Secondary | ICD-10-CM

## 2017-07-13 DIAGNOSIS — I10 Essential (primary) hypertension: Secondary | ICD-10-CM

## 2017-07-13 DIAGNOSIS — R7303 Prediabetes: Secondary | ICD-10-CM

## 2017-07-13 DIAGNOSIS — Z1321 Encounter for screening for nutritional disorder: Secondary | ICD-10-CM

## 2017-07-13 NOTE — Progress Notes (Signed)
Lab orders placed for completion prior to annual exam

## 2017-07-15 LAB — HEPATIC FUNCTION PANEL
AG Ratio: 2.1 (calc) (ref 1.0–2.5)
ALKALINE PHOSPHATASE (APISO): 47 U/L (ref 40–115)
ALT: 19 U/L (ref 9–46)
AST: 18 U/L (ref 10–40)
Albumin: 4.7 g/dL (ref 3.6–5.1)
BILIRUBIN DIRECT: 0.1 mg/dL (ref 0.0–0.2)
Globulin: 2.2 g/dL (calc) (ref 1.9–3.7)
Indirect Bilirubin: 0.5 mg/dL (calc) (ref 0.2–1.2)
Total Bilirubin: 0.6 mg/dL (ref 0.2–1.2)
Total Protein: 6.9 g/dL (ref 6.1–8.1)

## 2017-07-15 LAB — COMPLETE METABOLIC PANEL WITH GFR
AG RATIO: 2.1 (calc) (ref 1.0–2.5)
ALBUMIN MSPROF: 4.7 g/dL (ref 3.6–5.1)
ALT: 19 U/L (ref 9–46)
AST: 18 U/L (ref 10–40)
Alkaline phosphatase (APISO): 47 U/L (ref 40–115)
BUN/Creatinine Ratio: 11 (calc) (ref 6–22)
BUN: 16 mg/dL (ref 7–25)
CO2: 25 mmol/L (ref 20–32)
CREATININE: 1.45 mg/dL — AB (ref 0.60–1.35)
Calcium: 9.4 mg/dL (ref 8.6–10.3)
Chloride: 103 mmol/L (ref 98–110)
GFR, EST NON AFRICAN AMERICAN: 59 mL/min/{1.73_m2} — AB (ref >=60)
GFR, Est African American: 68 mL/min/{1.73_m2} (ref >=60)
GLOBULIN: 2.2 g/dL (ref 1.9–3.7)
Glucose, Bld: 96 mg/dL (ref 65–99)
Potassium: 4.6 mmol/L (ref 3.5–5.3)
Sodium: 138 mmol/L (ref 135–146)
TOTAL PROTEIN: 6.9 g/dL (ref 6.1–8.1)
Total Bilirubin: 0.6 mg/dL (ref 0.2–1.2)

## 2017-07-15 LAB — LIPID PANEL
CHOL/HDL RATIO: 5.8 (calc) — AB (ref <5.0)
CHOLESTEROL: 236 mg/dL — AB (ref <200)
HDL: 41 mg/dL (ref >40)
LDL CHOLESTEROL (CALC): 166 mg/dL — AB
Non-HDL Cholesterol (Calc): 195 mg/dL (calc) — ABNORMAL HIGH (ref <130)
Triglycerides: 145 mg/dL (ref <150)

## 2017-07-15 LAB — CBC WITH DIFFERENTIAL/PLATELET
Basophils Absolute: 51 cells/uL (ref 0–200)
Basophils Relative: 1.1 %
Eosinophils Absolute: 212 cells/uL (ref 15–500)
Eosinophils Relative: 4.6 %
HEMATOCRIT: 48 % (ref 38.5–50.0)
HEMOGLOBIN: 16.8 g/dL (ref 13.2–17.1)
LYMPHS ABS: 1366 {cells}/uL (ref 850–3900)
MCH: 30.7 pg (ref 27.0–33.0)
MCHC: 35 g/dL (ref 32.0–36.0)
MCV: 87.6 fL (ref 80.0–100.0)
MPV: 9.6 fL (ref 7.5–12.5)
Monocytes Relative: 7.4 %
NEUTROS ABS: 2631 {cells}/uL (ref 1500–7800)
NEUTROS PCT: 57.2 %
Platelets: 257 10*3/uL (ref 140–400)
RBC: 5.48 10*6/uL (ref 4.20–5.80)
RDW: 13.8 % (ref 11.0–15.0)
Total Lymphocyte: 29.7 %
WBC: 4.6 10*3/uL (ref 3.8–10.8)
WBCMIX: 340 {cells}/uL (ref 200–950)

## 2017-07-15 LAB — VITAMIN B12: VITAMIN B 12: 321 pg/mL (ref 200–1100)

## 2017-07-15 LAB — VITAMIN D 25 HYDROXY (VIT D DEFICIENCY, FRACTURES): VIT D 25 HYDROXY: 14 ng/mL — AB (ref 30–100)

## 2017-07-15 LAB — TSH: TSH: 0.67 mIU/L (ref 0.40–4.50)

## 2017-07-15 LAB — TESTOSTERONE: TESTOSTERONE: 209 ng/dL — AB (ref 250–827)

## 2017-07-17 MED ORDER — ERGOCALCIFEROL 1.25 MG (50000 UT) PO CAPS: 50000.0000 [IU] | ORAL_CAPSULE | ORAL | 0 refills | Status: DC

## 2017-07-17 MED FILL — VIT D2 1.25 MG (50,000 UNIT: 1.25 MG | 84 days supply | Qty: 12 | Fill #0

## 2017-07-17 NOTE — Progress Notes (Signed)
Call pt:  Serum creatine up some from last recheck. Are you using any protein powders or taking any ibuprofen/aleve/mobic regularly? If so stop or limit and recheck in 2 weeks.   Testosterone is lower than it has ever been. Did you stop testosterone injections? Is this something you would like to consider again?   Vitamin D really low lets start high dose for now. Recheck in 3 months.   Cholesterol has improved but not quite to goal. I know trying diet and weight loss. Recheck in 6 months.

## 2017-07-17 NOTE — Addendum Note (Signed)
Addended by: Donella Stade on: 07/17/2017 12:14 PM   Modules accepted: Orders

## 2017-07-18 ENCOUNTER — Encounter: Payer: Self-pay | Admitting: Physician Assistant

## 2017-07-18 ENCOUNTER — Ambulatory Visit (INDEPENDENT_AMBULATORY_CARE_PROVIDER_SITE_OTHER): Payer: 59 | Admitting: Physician Assistant

## 2017-07-18 VITALS — BP 116/71 | HR 81 | Ht >= 80 in | Wt 301.0 lb

## 2017-07-18 DIAGNOSIS — E291 Testicular hypofunction: Secondary | ICD-10-CM | POA: Diagnosis not present

## 2017-07-18 DIAGNOSIS — F331 Major depressive disorder, recurrent, moderate: Secondary | ICD-10-CM | POA: Diagnosis not present

## 2017-07-18 DIAGNOSIS — E7800 Pure hypercholesterolemia, unspecified: Secondary | ICD-10-CM | POA: Diagnosis not present

## 2017-07-18 DIAGNOSIS — G4733 Obstructive sleep apnea (adult) (pediatric): Secondary | ICD-10-CM | POA: Diagnosis not present

## 2017-07-18 DIAGNOSIS — R7989 Other specified abnormal findings of blood chemistry: Secondary | ICD-10-CM | POA: Diagnosis not present

## 2017-07-18 DIAGNOSIS — E559 Vitamin D deficiency, unspecified: Secondary | ICD-10-CM

## 2017-07-18 DIAGNOSIS — Z0000 Encounter for general adult medical examination without abnormal findings: Secondary | ICD-10-CM | POA: Diagnosis not present

## 2017-07-18 DIAGNOSIS — E785 Hyperlipidemia, unspecified: Secondary | ICD-10-CM | POA: Insufficient documentation

## 2017-07-18 DIAGNOSIS — E538 Deficiency of other specified B group vitamins: Secondary | ICD-10-CM

## 2017-07-18 MED ORDER — BUPROPION HCL ER (XL) 300 MG PO TB24: ORAL_TABLET | ORAL | 3 refills | Status: DC

## 2017-07-18 MED ORDER — FLUOXETINE HCL 40 MG PO CAPS: 40.0000 mg | ORAL_CAPSULE | Freq: Every day | ORAL | 3 refills | Status: DC

## 2017-07-18 NOTE — Progress Notes (Signed)
Subjective:    Patient ID: Elijah Ford, male    DOB: 1976-04-28, 41 y.o.   MRN: [id number]  HPI Pt is a 41 male who presents to the clinic for CPE.   .. Active Ambulatory Problems    Diagnosis Date Noted  . Essential hypertension, benign 11/04/2013  . Anxiety state 11/04/2013  . Depression 11/04/2013  . Obesity 11/04/2013  . GERD (gastroesophageal reflux disease) 11/04/2013  . Tarsometatarsal osteoarthritis of both feet 06/20/2014  . Bilateral extensor hallucis longus tendinitis 06/27/2014  . Metatarsal stress fracture of left foot 07/08/2014  . Prediabetes 07/09/2014  . Gout 07/09/2014  . Asthma, moderate persistent 11/26/2014  . Eosinophilia 06/26/2015  . Hypogonadism male 07/06/2015  . Left ear pain 12/02/2015  . Plantar fasciitis, left 03/09/2016  . Elevated hematocrit 04/06/2016  . Ventral hernia without obstruction or gangrene 06/01/2016  . Plantar fascial fibromatosis of left foot 12/14/2016  . Bilateral metatarsal stress injury 05/09/2017  . Sleep apnea 07/18/2017  . Hyperlipidemia 07/18/2017  . Vitamin D deficiency 07/18/2017  . Elevated serum creatinine 07/18/2017  . Low serum vitamin B12 07/18/2017   Resolved Ambulatory Problems    Diagnosis Date Noted  . Tinea pedis 06/27/2014  . Acute bronchitis 06/27/2014  . CAP (community acquired pneumonia) 09/18/2014  . Pneumonia 09/18/2014  . Bronchitis, asthmatic 10/15/2014  . Asthma with acute exacerbation 11/26/2014  . Skin lesion of right lower extremity 03/13/2015   Past Medical History:  Diagnosis Date  . Anxiety   . Arthritis   . Depression   . GERD (gastroesophageal reflux disease)   . Hyperlipidemia   . Hypertension   . Pneumonia 2015  . Seasonal allergies   . Sleep apnea    .Marland Kitchen Family History  Problem Relation Age of Onset  . Depression Mother   . Cancer Father   . Alcohol abuse Brother   . Depression Brother   . Alcohol abuse Brother   . Depression Brother    . Social History    Social History  . Marital status: Married    Spouse name: N/A  . Number of children: N/A  . Years of education: N/A   Occupational History  . Not on file.   Social History Main Topics  . Smoking status: Never Smoker  . Smokeless tobacco: Never Used  . Alcohol use No  . Drug use: No  . Sexual activity: Yes   Other Topics Concern  . Not on file   Social History Narrative  . No narrative on file      Review of Systems  All other systems reviewed and are negative.      Objective:   Physical Exam BP 116/71   Pulse 81   Ht 6\' 8"  (2.032 m)   Wt (!) 301 lb (136.5 kg)   BMI 33.07 kg/m   General Appearance:    Alert, cooperative, no distress, appears stated age  Head:    Normocephalic, without obvious abnormality, atraumatic  Eyes:    PERRL, conjunctiva/corneas clear, EOM's intact, fundi    benign, both eyes       Ears:    Normal TM's and external ear canals, both ears  Nose:   Nares normal, septum midline, mucosa normal, no drainage    or sinus tenderness  Throat:   Lips, mucosa, and tongue normal; teeth and gums normal  Neck:   Supple, symmetrical, trachea midline, no adenopathy;       thyroid:  No enlargement/tenderness/nodules; no carotid   bruit  or JVD  Back:     Symmetric, no curvature, ROM normal, no CVA tenderness  Lungs:     Clear to auscultation bilaterally, respirations unlabored  Chest wall:    No tenderness or deformity  Heart:    Regular rate and rhythm, S1 and S2 normal, no murmur, rub   or gallop  Abdomen:     Soft, non-tender, bowel sounds active all four quadrants,    no masses, no organomegaly        Extremities:   Extremities normal, atraumatic, no cyanosis or edema  Pulses:   2+ and symmetric all extremities  Skin:   Skin color, texture, turgor normal, no rashes or lesions  Lymph nodes:   Cervical, supraclavicular, and axillary nodes normal  Neurologic:   CNII-XII intact. Normal strength, sensation and reflexes      throughout          Assessment & Plan:  .Marland KitchenCallaway was seen today for annual exam.  Diagnoses and all orders for this visit:  Routine physical examination  Hypogonadism in male  Obstructive sleep apnea syndrome  Pure hypercholesterolemia  Vitamin D deficiency  Elevated serum creatinine  Low serum vitamin B12  Moderate episode of recurrent major depressive disorder ([facility]) -     FLUoxetine (PROZAC) 40 MG capsule; Take 1 capsule (40 mg total) by mouth daily. -     buPROPion (WELLBUTRIN XL) 300 MG 24 hr tablet; TAKE 1 TABLET (300 MG TOTAL) BY MOUTH DAILY   .Marland Kitchen Depression screen [facility] 2/9 07/18/2017 05/05/2017 12/19/2013 11/04/2013  Decreased Interest 0 3 1 1   Down, Depressed, Hopeless 0 3 2 1   PHQ - 2 Score 0 6 3 2   Altered sleeping - 3 1 2   Tired, decreased energy - 3 1 1   Change in appetite - 3 0 1  Feeling bad or failure about yourself  - 3 3 3   Trouble concentrating - 3 3 1   Moving slowly or fidgety/restless - 2 2 1   Suicidal thoughts - 0 0 0  PHQ-9 Score - 23 13 11    ..Start a regular exercise program and make sure you are eating a healthy diet Try to eat 4 servings of dairy a day or take a calcium supplement (500mg  twice a day). Your vaccines are up to date.   Vitamin D is low. Start 50,000 units weekly.   Elevated serum creatine. Not taking NSAIDs. Not on any protein powder. Recheck in 2 weeks.   Restart testosterone shots today. Will check labs in 1-2 months.

## 2017-07-18 NOTE — Patient Instructions (Signed)

## 2017-08-15 ENCOUNTER — Ambulatory Visit (INDEPENDENT_AMBULATORY_CARE_PROVIDER_SITE_OTHER): Payer: BLUE CROSS/BLUE SHIELD | Admitting: Physician Assistant

## 2017-08-15 VITALS — BP 128/71 | HR 71 | Resp 16 | Wt 303.0 lb

## 2017-08-15 DIAGNOSIS — E291 Testicular hypofunction: Secondary | ICD-10-CM | POA: Diagnosis not present

## 2017-08-15 MED ORDER — TESTOSTERONE CYPIONATE 200 MG/ML IM SOLN
200.0000 mg | INTRAMUSCULAR | Status: DC
  Administered 2017-08-15: 200 mg via INTRAMUSCULAR

## 2017-08-15 NOTE — Progress Notes (Signed)
   Subjective:    Patient ID: Elijah Ford, male    DOB: [birth year], 41 y.o.   MRN: [id number]  HPI Patient here for 2nd testosterone injection; he and wife are trying to organize getting injections for home use. Denies chest pain, shortness of breath, headaches, and problems with medication or mood changes.    Review of Systems     Objective:   Physical Exam        Assessment & Plan:  Patient toelrated injection well without complications. Patient advised to schedule next  Injection in 14 days.  Agree with above plan. Iran Planas PA-C

## 2017-08-30 ENCOUNTER — Ambulatory Visit (INDEPENDENT_AMBULATORY_CARE_PROVIDER_SITE_OTHER): Payer: BLUE CROSS/BLUE SHIELD | Admitting: Physician Assistant

## 2017-08-30 VITALS — BP 117/78 | HR 76 | Wt 307.0 lb

## 2017-08-30 DIAGNOSIS — E291 Testicular hypofunction: Secondary | ICD-10-CM | POA: Diagnosis not present

## 2017-08-30 MED ORDER — TESTOSTERONE CYPIONATE 200 MG/ML IM SOLN
200.0000 mg | Freq: Once | INTRAMUSCULAR | Status: AC
Start: 2017-08-30 — End: 2017-08-30
  Administered 2017-08-30: 200 mg via INTRAMUSCULAR

## 2017-08-30 NOTE — Progress Notes (Signed)
   Subjective:    Patient ID: Elijah Ford, male    DOB: 1975/12/05, 41 y.o.   MRN: [id number]  HPI  Elijah Ford is here for a testosterone injection. Denies chest pain, shortness of breath, headaches or mood changes. He reports feeling less fatigued since being on the testosterone replacement.   Review of Systems     Objective:   Physical Exam        Assessment & Plan:  Patient tolerated injection well without complications. Patient advised to schedule next injection 14 days from today.

## 2017-09-05 ENCOUNTER — Other Ambulatory Visit: Payer: Self-pay | Admitting: *Deleted

## 2017-09-05 MED ORDER — TESTOSTERONE CYPIONATE 200 MG/ML IM SOLN: 200.0000 mg | INTRAMUSCULAR | 0 refills | Status: DC

## 2017-09-14 MED FILL — BD NEEDLES 22GX1.5": 22G X 1-1/2 | 10 days supply | Qty: 10 | Fill #0

## 2017-09-14 MED FILL — BD NEEDLES 22GX1.5: 22G X 1-1/2 | 10 days supply | Qty: 10 | Fill #0

## 2017-09-14 MED FILL — FLUoxetine HCL 40 MG CAPS: 40 | 90 days supply | Qty: 90 | Fill #0

## 2017-09-14 MED FILL — TESTOSTERONE CYPIONATE 200: 200 | 28 days supply | Qty: 2 | Fill #0

## 2017-09-14 MED FILL — BD 3 ML SYRINGE WITH NEEDLE: 23G X 1" | 90 days supply | Qty: 10 | Fill #0

## 2017-09-18 MED FILL — ALLOPURINOL 300 MG TABS: 300 | 90 days supply | Qty: 90 | Fill #1

## 2017-09-18 MED FILL — OMEPRAZOLE DR 40 MG CAPSULE: 40 | 90 days supply | Qty: 90 | Fill #4

## 2017-09-20 ENCOUNTER — Encounter: Payer: Self-pay | Admitting: Physician Assistant

## 2017-09-20 ENCOUNTER — Ambulatory Visit: Payer: BLUE CROSS/BLUE SHIELD | Admitting: Physician Assistant

## 2017-09-20 ENCOUNTER — Ambulatory Visit: Payer: BLUE CROSS/BLUE SHIELD | Admitting: Family Medicine

## 2017-09-20 VITALS — BP 134/76 | HR 84 | Temp 98.4°F | Ht >= 80 in | Wt 311.0 lb

## 2017-09-20 DIAGNOSIS — J40 Bronchitis, not specified as acute or chronic: Secondary | ICD-10-CM | POA: Diagnosis not present

## 2017-09-20 DIAGNOSIS — J329 Chronic sinusitis, unspecified: Secondary | ICD-10-CM | POA: Diagnosis not present

## 2017-09-20 DIAGNOSIS — R6883 Chills (without fever): Secondary | ICD-10-CM

## 2017-09-20 DIAGNOSIS — J069 Acute upper respiratory infection, unspecified: Secondary | ICD-10-CM | POA: Diagnosis not present

## 2017-09-20 LAB — POCT INFLUENZA A/B
INFLUENZA B, POC: NEGATIVE
Influenza A, POC: NEGATIVE

## 2017-09-20 MED ORDER — AZITHROMYCIN 250 MG PO TABS: ORAL_TABLET | ORAL | 0 refills | Status: DC

## 2017-09-20 MED FILL — AZITHROMYCIN 250 MG TABLET: 250 | 5 days supply | Qty: 6 | Fill #0

## 2017-09-20 NOTE — Progress Notes (Signed)
Subjective:    Patient ID: Elijah Ford, male    DOB: May 31, 1976, 42 y.o.   MRN: [id number]  HPI  Pt is a 42 yo pleasant male who presents to the clinic with fever, chills, cough, sinus pressure, lots of rhinorrhea since yesterday. He has not tried anything OTC yet.he has taken some nautrual herbs.  He is concerned because he leaves for business trip this weekend for 2 weeks. He really does not want to get worse. He also has a hx of bronchitis to pneumonia.   .. Active Ambulatory Problems    Diagnosis Date Noted  . Essential hypertension, benign 11/04/2013  . Anxiety state 11/04/2013  . Depression 11/04/2013  . Obesity 11/04/2013  . GERD (gastroesophageal reflux disease) 11/04/2013  . Tarsometatarsal osteoarthritis of both feet 06/20/2014  . Bilateral extensor hallucis longus tendinitis 06/27/2014  . Metatarsal stress fracture of left foot 07/08/2014  . Prediabetes 07/09/2014  . Gout 07/09/2014  . Asthma, moderate persistent 11/26/2014  . Eosinophilia 06/26/2015  . Hypogonadism male 07/06/2015  . Left ear pain 12/02/2015  . Plantar fasciitis, left 03/09/2016  . Elevated hematocrit 04/06/2016  . Ventral hernia without obstruction or gangrene 06/01/2016  . Plantar fascial fibromatosis of left foot 12/14/2016  . Bilateral metatarsal stress injury 05/09/2017  . Sleep apnea 07/18/2017  . Hyperlipidemia 07/18/2017  . Vitamin D deficiency 07/18/2017  . Elevated serum creatinine 07/18/2017  . Low serum vitamin B12 07/18/2017   Resolved Ambulatory Problems    Diagnosis Date Noted  . Tinea pedis 06/27/2014  . Acute bronchitis 06/27/2014  . CAP (community acquired pneumonia) 09/18/2014  . Pneumonia 09/18/2014  . Bronchitis, asthmatic 10/15/2014  . Asthma with acute exacerbation 11/26/2014  . Skin lesion of right lower extremity 03/13/2015   Past Medical History:  Diagnosis Date  . Anxiety   . Arthritis   . Depression   . GERD (gastroesophageal reflux disease)   .  Hyperlipidemia   . Hypertension   . Pneumonia 2015  . Seasonal allergies   . Sleep apnea       Review of Systems See HPI.     Objective:   Physical Exam  Constitutional: He is oriented to person, place, and time. He appears well-developed and well-nourished.  HENT:  Head: Normocephalic and atraumatic.  TM's clear bilaterally.  Oropharynx erythematous. Uvula removed. Tonsils removed.  Nasal turbinates red and swollen.  No sinus tenderness.   Eyes: Conjunctivae are normal. Right eye exhibits no discharge. Left eye exhibits no discharge.  Neck: Normal range of motion. Neck supple.  Cardiovascular: Normal rate, regular rhythm and normal heart sounds.  Pulmonary/Chest: Effort normal and breath sounds normal. He has no wheezes.  Deep cough on exam.   Lymphadenopathy:    He has no cervical adenopathy.  Neurological: He is alert and oriented to person, place, and time.  Psychiatric: He has a normal mood and affect. His behavior is normal.          Assessment & Plan:  .Marland KitchenDominique was seen today for chills.  Diagnoses and all orders for this visit:  Acute upper respiratory infection  Chills -     POCT Influenza A/B  Sinobronchitis -     azithromycin (ZITHROMAX) 250 MG tablet; 2 tablets now and then one tablet for 4 days.   .. Results for orders placed or performed in visit on 09/20/17  POCT Influenza A/B  Result Value Ref Range   Influenza A, POC Negative Negative   Influenza B, POC Negative Negative  Flu negative. Discussed with 1 day duration likely viral upper respiratory infection. Discussed humidifer, nasal spray, tylenol cold/sinus/severe. If not improving by Saturday start zpak. Rest and hydrate. Discussed 4-8 days duration.

## 2017-09-20 NOTE — Patient Instructions (Addendum)
Tylenol cold sinus severe.    Upper Respiratory Infection, Adult Most upper respiratory infections (URIs) are caused by a virus. A URI affects the nose, throat, and upper air passages. The most common type of URI is often called "the common cold." Follow these instructions at home:  Take medicines only as told by your doctor.  Gargle warm saltwater or take cough drops to comfort your throat as told by your doctor.  Use a warm mist humidifier or inhale steam from a shower to increase air moisture. This may make it easier to breathe.  Drink enough fluid to keep your pee (urine) clear or pale yellow.  Eat soups and other clear broths.  Have a healthy diet.  Rest as needed.  Go back to work when your fever is gone or your doctor says it is okay. ? You may need to stay home longer to avoid giving your URI to others. ? You can also wear a face mask and wash your hands often to prevent spread of the virus.  Use your inhaler more if you have asthma.  Do not use any tobacco products, including cigarettes, chewing tobacco, or electronic cigarettes. If you need help quitting, ask your doctor. Contact a doctor if:  You are getting worse, not better.  Your symptoms are not helped by medicine.  You have chills.  You are getting more short of breath.  You have brown or red mucus.  You have yellow or brown discharge from your nose.  You have pain in your face, especially when you bend forward.  You have a fever.  You have puffy (swollen) neck glands.  You have pain while swallowing.  You have white areas in the back of your throat. Get help right away if:  You have very bad or constant: ? Headache. ? Ear pain. ? Pain in your forehead, behind your eyes, and over your cheekbones (sinus pain). ? Chest pain.  You have long-lasting (chronic) lung disease and any of the following: ? Wheezing. ? Long-lasting cough. ? Coughing up blood. ? A change in your usual mucus.  You  have a stiff neck.  You have changes in your: ? Vision. ? Hearing. ? Thinking. ? Mood. This information is not intended to replace advice given to you by your health care provider. Make sure you discuss any questions you have with your health care provider. Document Released: 02/22/2008 Document Revised: 05/08/2016 Document Reviewed: 12/11/2013 Elsevier Interactive Patient Education  2018 [employer].

## 2017-09-21 ENCOUNTER — Encounter: Payer: Self-pay | Admitting: Physician Assistant

## 2017-09-25 ENCOUNTER — Other Ambulatory Visit: Payer: Self-pay | Admitting: Physician Assistant

## 2017-09-25 ENCOUNTER — Other Ambulatory Visit: Payer: Self-pay | Admitting: *Deleted

## 2017-09-25 DIAGNOSIS — E559 Vitamin D deficiency, unspecified: Secondary | ICD-10-CM

## 2017-10-12 MED FILL — BUPROPION HCL XL 300 MG TAB: 300 | 90 days supply | Qty: 90 | Fill #0

## 2017-10-23 ENCOUNTER — Other Ambulatory Visit: Payer: Self-pay | Admitting: Physician Assistant

## 2017-10-24 ENCOUNTER — Telehealth: Payer: Self-pay | Admitting: Physician Assistant

## 2017-10-24 MED ORDER — TESTOSTERONE CYPIONATE 200 MG/ML IM SOLN: 200.0000 mg | INTRAMUSCULAR | 0 refills | Status: DC

## 2017-10-24 MED FILL — BD 3 ML SYRINGE WITH NEEDLE: 23G X 1" | 90 days supply | Qty: 10 | Fill #0

## 2017-10-24 MED FILL — BD NEEDLES 22GX1.5": 22G X 1-1/2 | 10 days supply | Qty: 10 | Fill #0

## 2017-10-24 MED FILL — TESTOSTERONE CYPIONATE 200: 200 | 84 days supply | Qty: 6 | Fill #0

## 2017-10-24 MED FILL — BD NEEDLES 22GX1.5: 22G X 1-1/2 | 10 days supply | Qty: 10 | Fill #0

## 2017-10-24 NOTE — Telephone Encounter (Signed)
Need to check testosterone, PSA, CBC. I will also refill for now.

## 2017-10-24 NOTE — Telephone Encounter (Signed)
Received fax for PA on Testosterone Cypionate sent through cover my meds and received authorization. I will notify the pharmacy.   Case ID: [id number] Valid: 09/24/17 - 10/24/2018

## 2017-10-30 ENCOUNTER — Encounter: Payer: Self-pay | Admitting: Physician Assistant

## 2017-10-30 ENCOUNTER — Ambulatory Visit (INDEPENDENT_AMBULATORY_CARE_PROVIDER_SITE_OTHER): Payer: BLUE CROSS/BLUE SHIELD | Admitting: Physician Assistant

## 2017-10-30 VITALS — BP 115/80 | HR 77 | Temp 98.2°F | Wt 322.0 lb

## 2017-10-30 DIAGNOSIS — J209 Acute bronchitis, unspecified: Secondary | ICD-10-CM | POA: Diagnosis not present

## 2017-10-30 DIAGNOSIS — J029 Acute pharyngitis, unspecified: Secondary | ICD-10-CM | POA: Diagnosis not present

## 2017-10-30 DIAGNOSIS — J4541 Moderate persistent asthma with (acute) exacerbation: Secondary | ICD-10-CM | POA: Diagnosis not present

## 2017-10-30 LAB — POCT RAPID STREP A (OFFICE): RAPID STREP A SCREEN: NEGATIVE

## 2017-10-30 MED ORDER — AZITHROMYCIN 250 MG PO TABS: ORAL_TABLET | ORAL | 0 refills | Status: DC

## 2017-10-30 MED ORDER — IPRATROPIUM BROMIDE 0.06 % NA SOLN: 2.0000 | Freq: Four times a day (QID) | NASAL | 0 refills | Status: DC | PRN

## 2017-10-30 MED ORDER — GUAIFENESIN ER 600 MG PO TB12: 600.0000 mg | ORAL_TABLET | Freq: Two times a day (BID) | ORAL | Status: DC

## 2017-10-30 MED ORDER — PREDNISONE 50 MG PO TABS: ORAL_TABLET | ORAL | 0 refills | Status: DC

## 2017-10-30 MED FILL — AZITHROMYCIN 250 MG TABLET: 250 | 5 days supply | Qty: 6 | Fill #0

## 2017-10-30 MED FILL — IPRATROPIUM 0.06% SPRAY: 0.06 | 11 days supply | Qty: 15 | Fill #0

## 2017-10-30 MED FILL — predniSONE 50 MG TABS: 50 | 5 days supply | Qty: 5 | Fill #0

## 2017-10-30 NOTE — Progress Notes (Signed)
HPI:                                                                Elijah Ford is a 42 y.o. male who presents to [facility]: [place] today for cough  URI   This is a new problem. The current episode started in the past 7 days. The problem has been unchanged. There has been no fever. Associated symptoms include chest pain, congestion, coughing (productive of clear mucous), a sore throat and wheezing. Pertinent negatives include no ear pain, headaches, neck pain or swollen glands. He has tried decongestant and inhaler use (Albuterol nebulizer) for the symptoms. The treatment provided moderate relief.     No flowsheet data found.    Past Medical History:  Diagnosis Date  . Anxiety   . Arthritis    both feet, soreness in multiple areas, plantar fascitis, uses orthodics in both shoes   . Depression    counselling in place, sporadically   . GERD (gastroesophageal reflux disease)   . Hyperlipidemia   . Hypertension    was in the past   . Pneumonia 2015  . Seasonal allergies   . Sleep apnea    CPAP q night    Past Surgical History:  Procedure Laterality Date  . EYE SURGERY     lasik- both   . INGUINAL HERNIA REPAIR Left 07/21/2016   Procedure: REPAIR LEFT INGUINAL HERNIA;  Surgeon: Erroll Luna, MD;  Location: [facility];  Service: General;  Laterality: Left;  . INSERTION OF MESH Left 07/21/2016   Procedure: INSERTION OF MESH;  Surgeon: Erroll Luna, MD;  Location: [facility];  Service: General;  Laterality: Left;  . [place]  . TONSILECTOMY/ADENOIDECTOMY WITH MYRINGOTOMY     no myringotomy   . uvula removal     multiple surgeries for sinuses due to sleep apnea  . VASECTOMY    . VENTRAL HERNIA REPAIR N/A 07/21/2016   Procedure: REPAIR  VENTRAL  HERNIA X 2;  Surgeon: Erroll Luna, MD;  Location: [facility];  Service: General;  Laterality: N/A;   Social History   Tobacco Use  . Smoking status: Never Smoker  . Smokeless  tobacco: Never Used  Substance Use Topics  . Alcohol use: No   family history includes Alcohol abuse in his brother and brother; Cancer in his father; Depression in his brother, brother, and mother.    ROS: negative except as noted in the HPI  Medications: Current Outpatient Medications  Medication Sig Dispense Refill  . albuterol (PROVENTIL) (2.5 MG/3ML) 0.083% nebulizer solution Take 3 mLs (2.5 mg total) by nebulization every 6 (six) hours as needed for wheezing or shortness of breath. 30 vial 6  . allopurinol (ZYLOPRIM) 300 MG tablet TAKE 1 TABLET (300 MG TOTAL) BY MOUTH DAILY. 90 tablet 4  . buPROPion (WELLBUTRIN XL) 300 MG 24 hr tablet TAKE 1 TABLET (300 MG TOTAL) BY MOUTH DAILY 90 tablet 3  . ergocalciferol (VITAMIN D2) 50000 units capsule Take 1 capsule (50,000 Units total) by mouth once a week. 12 capsule 0  . FLUoxetine (PROZAC) 40 MG capsule Take 1 capsule (40 mg total) by mouth daily. 90 capsule 3  . omeprazole (PRILOSEC) 40 MG capsule Take 1 capsule (  40 mg total) by mouth daily. 90 capsule 4  . testosterone cypionate (DEPOTESTOSTERONE CYPIONATE) 200 MG/ML injection Inject 1 mL (200 mg total) into the muscle every 14 (fourteen) days. Please include syringes and 22G 1.5" needles 6 mL 0  . azithromycin (ZITHROMAX Z-PAK) 250 MG tablet Take 2 tablets (500 mg) on  Day 1,  followed by 1 tablet (250 mg) once daily on Days 2 through 5. 6 tablet 0  . guaiFENesin (MUCINEX) 600 MG 12 hr tablet Take 1 tablet (600 mg total) by mouth 2 (two) times daily.    Marland Kitchen ipratropium (ATROVENT) 0.06 % nasal spray Place 2 sprays into both nostrils 4 (four) times daily as needed. 15 mL 0  . predniSONE (DELTASONE) 50 MG tablet One tab PO daily for 5 days. 5 tablet 0   No current facility-administered medications for this visit.    Allergies  Allergen Reactions  . Other Itching    MELONS       Objective:  BP 115/80   Pulse 77   Temp 98.2 F (36.8 C) (Oral)   Wt (!) 322 lb (146.1 kg)   SpO2 97%    BMI 35.37 kg/m  Gen:  alert, not ill-appearing, no distress, appropriate for age HEENT: head normocephalic without obvious abnormality, conjunctiva and cornea clear, oropharynx with erythema, uvula and tonsils absent, neck supple, no adenopathy, trachea midline Pulm: Normal work of breathing, normal phonation, clear to auscultation bilaterally, no wheezes, rales or rhonchi, bronchitic cough CV: Normal rate, regular rhythm, s1 and s2 distinct, no murmurs, clicks or rubs  Neuro: alert and oriented x 3, no tremor MSK: extremities atraumatic, normal gait and station Skin: intact, no rashes on exposed skin, no jaundice, no cyanosis   Results for orders placed or performed in visit on 10/30/17 (from the past 72 hour(s))  POCT rapid strep A     Status: Normal   Collection Time: 10/30/17  8:43 AM  Result Value Ref Range   Rapid Strep A Screen Negative Negative   No results found.    Assessment and Plan: 42 y.o. male with  1. Moderate persistent asthma with acute exacerbation - SpO2 97% on RA, no evidence of respiratory distress, responding well to nebulizer treatments at home. Declines duoneb in office today - likely viral etiology that has triggered asthma exacerbation. He does have a history of pneumonia. Will cover for atypicals with Macrolide. Steroid burst.  2. Acute bronchitis, unspecified organism - predniSONE (DELTASONE) 50 MG tablet; One tab PO daily for 5 days.  Dispense: 5 tablet; Refill: 0 - ipratropium (ATROVENT) 0.06 % nasal spray; Place 2 sprays into both nostrils 4 (four) times daily as needed.  Dispense: 15 mL; Refill: 0 - azithromycin (ZITHROMAX Z-PAK) 250 MG tablet; Take 2 tablets (500 mg) on  Day 1,  followed by 1 tablet (250 mg) once daily on Days 2 through 5.  Dispense: 6 tablet; Refill: 0 - guaiFENesin (MUCINEX) 600 MG 12 hr tablet; Take 1 tablet (600 mg total) by mouth 2 (two) times daily.  3. Sore throat - POCT rapid strep A negative. Very low suspicion for strep  pharyngitis     Patient education and anticipatory guidance given Patient agrees with treatment plan Follow-up as needed if symptoms worsen or fail to improve  Darlyne Russian PA-C

## 2017-10-30 NOTE — Patient Instructions (Signed)

## 2017-11-21 ENCOUNTER — Telehealth: Payer: Self-pay | Admitting: Physician Assistant

## 2017-11-21 ENCOUNTER — Other Ambulatory Visit: Payer: Self-pay | Admitting: *Deleted

## 2017-11-21 DIAGNOSIS — F331 Major depressive disorder, recurrent, moderate: Secondary | ICD-10-CM

## 2017-11-21 MED ORDER — ALLOPURINOL 300 MG PO TABS: ORAL_TABLET | ORAL | 4 refills | Status: DC

## 2017-11-21 MED ORDER — BUPROPION HCL ER (XL) 300 MG PO TB24: ORAL_TABLET | ORAL | 0 refills | Status: DC

## 2017-11-21 MED ORDER — FLUOXETINE HCL 40 MG PO CAPS: 40.0000 mg | ORAL_CAPSULE | Freq: Every day | ORAL | 0 refills | Status: DC

## 2017-11-21 MED ORDER — OMEPRAZOLE 40 MG PO CPDR: 40.0000 mg | DELAYED_RELEASE_CAPSULE | Freq: Every day | ORAL | 4 refills | Status: DC

## 2017-11-21 MED ORDER — TESTOSTERONE CYPIONATE 200 MG/ML IM SOLN: 200.0000 mg | INTRAMUSCULAR | 0 refills | Status: DC

## 2017-11-21 NOTE — Telephone Encounter (Signed)
All of Devean's routine medications need to go through [facility], per [facility]. Please send refills to [facility], now and in the future. Thank you!

## 2017-11-21 NOTE — Telephone Encounter (Signed)
Done. Pt will need a f/u within the next 90 days.

## 2017-12-21 ENCOUNTER — Encounter: Payer: Self-pay | Admitting: Physician Assistant

## 2017-12-21 ENCOUNTER — Ambulatory Visit (INDEPENDENT_AMBULATORY_CARE_PROVIDER_SITE_OTHER): Payer: BLUE CROSS/BLUE SHIELD | Admitting: Physician Assistant

## 2017-12-21 VITALS — BP 143/83 | HR 105 | Temp 98.5°F | Ht >= 80 in | Wt 325.0 lb

## 2017-12-21 DIAGNOSIS — J4541 Moderate persistent asthma with (acute) exacerbation: Secondary | ICD-10-CM

## 2017-12-21 DIAGNOSIS — J309 Allergic rhinitis, unspecified: Secondary | ICD-10-CM | POA: Diagnosis not present

## 2017-12-21 MED ORDER — MONTELUKAST SODIUM 10 MG PO TABS: 10.0000 mg | ORAL_TABLET | Freq: Every day | ORAL | 3 refills | Status: DC

## 2017-12-21 MED ORDER — METHYLPREDNISOLONE SODIUM SUCC 125 MG IJ SOLR
125.0000 mg | Freq: Once | INTRAMUSCULAR | Status: AC
  Administered 2017-12-21: 125 mg via INTRAMUSCULAR

## 2017-12-21 MED ORDER — AZITHROMYCIN 250 MG PO TABS: ORAL_TABLET | ORAL | 0 refills | Status: DC

## 2017-12-21 MED FILL — MONTELUKAST SOD 10 MG TAB: 10 | 90 days supply | Qty: 90 | Fill #0

## 2017-12-21 NOTE — Patient Instructions (Signed)
Start singulair. Continue with zyrtec. Solumedrol shot today. Start flonase daily. Sinus rinses.

## 2017-12-21 NOTE — Progress Notes (Signed)
Subjective:    Patient ID: Elijah Ford, male    DOB: 31-Mar-1976, 42 y.o.   MRN: [id number]  HPI Elijah Ford is a 42 yo male with asthma who presents today with cough, congestion, and some wheezing. He started to notice his symptoms last night. He denies any fevers, chills. He has a history of asthma and had a breathing treatment this morning. He is feeling better from that aspect. He states that he is not using inhaler daily, or most days of the week. He is not becoming more short of breath while walking around work. He has a history of multiple head/sinus surgeries and is prone to infection. He is taking a allergy pill but does not know the name. No fever, chills, body aches.   .. Active Ambulatory Problems    Diagnosis Date Noted  . Essential hypertension, benign 11/04/2013  . Anxiety state 11/04/2013  . Depression 11/04/2013  . Obesity 11/04/2013  . GERD (gastroesophageal reflux disease) 11/04/2013  . Tarsometatarsal osteoarthritis of both feet 06/20/2014  . Bilateral extensor hallucis longus tendinitis 06/27/2014  . Metatarsal stress fracture of left foot 07/08/2014  . Prediabetes 07/09/2014  . Gout 07/09/2014  . Asthma, moderate persistent 11/26/2014  . Eosinophilia 06/26/2015  . Hypogonadism male 07/06/2015  . Left ear pain 12/02/2015  . Plantar fasciitis, left 03/09/2016  . Elevated hematocrit 04/06/2016  . Ventral hernia without obstruction or gangrene 06/01/2016  . Plantar fascial fibromatosis of left foot 12/14/2016  . Bilateral metatarsal stress injury 05/09/2017  . Sleep apnea 07/18/2017  . Hyperlipidemia 07/18/2017  . Vitamin D deficiency 07/18/2017  . Elevated serum creatinine 07/18/2017  . Low serum vitamin B12 07/18/2017   Resolved Ambulatory Problems    Diagnosis Date Noted  . Tinea pedis 06/27/2014  . Acute bronchitis 06/27/2014  . CAP (community acquired pneumonia) 09/18/2014  . Pneumonia 09/18/2014  . Bronchitis, asthmatic 10/15/2014  . Asthma with acute  exacerbation 11/26/2014  . Skin lesion of right lower extremity 03/13/2015   Past Medical History:  Diagnosis Date  . Anxiety   . Arthritis   . Depression   . GERD (gastroesophageal reflux disease)   . Hyperlipidemia   . Hypertension   . Pneumonia 2015  . Seasonal allergies   . Sleep apnea       Review of Systems  Constitutional: Negative for chills, fatigue and fever.  HENT: Positive for congestion and sinus pressure.   Respiratory: Positive for cough and wheezing. Negative for shortness of breath.   Cardiovascular: Negative for chest pain and palpitations.       Objective:   Physical Exam  Constitutional: He is oriented to person, place, and time. He appears well-developed and well-nourished.  HENT:  Head: Normocephalic and atraumatic.  Right Ear: External ear normal.  Left Ear: External ear normal.  Nose: Right sinus exhibits maxillary sinus tenderness. Left sinus exhibits maxillary sinus tenderness.  Absent uvula Nasal turbinates red and swollen.   Eyes: Conjunctivae are normal.  Neck: Normal range of motion.  Cardiovascular: Normal rate, regular rhythm and normal heart sounds.  No murmur heard. Pulmonary/Chest: Effort normal and breath sounds normal. No respiratory distress. He has no wheezes.  Musculoskeletal: Normal range of motion.  Lymphadenopathy:    He has no cervical adenopathy.  Neurological: He is alert and oriented to person, place, and time.  Skin: Skin is warm and dry.  Psychiatric: He has a normal mood and affect. His behavior is normal.  Nursing note and vitals reviewed.  Assessment & Plan:  Marland KitchenMarland KitchenDiagnoses and all orders for this visit:  Allergic sinusitis -     azithromycin (ZITHROMAX) 250 MG tablet; Take 2 tablets now and then one tablet for 4 days. -     methylPREDNISolone sodium succinate (SOLU-MEDROL) 125 mg/2 mL injection 125 mg  Moderate persistent asthma with acute exacerbation -     montelukast (SINGULAIR) 10 MG tablet;  Take 1 tablet (10 mg total) by mouth at bedtime. -     methylPREDNISolone sodium succinate (SOLU-MEDROL) 125 mg/2 mL injection 125 mg  Patient is at one day of symptoms but is concerned because he is leaving for a week to go to [place] with his son on vacation.  I certainly think due to the increase in pollen over the last week that this is more allergic.  Solu-Medrol shot was given in office today.  I did go ahead and add Singulair to his over-the-counter allergy pill.  I think this could help with some of his asthmatic symptoms and wheezing.  Reassured patient I did not see any sign of bacterial infection today.  His wife is very concerned because he has recurrent infections that take a turn for the worse fast.  I did provide him with a prescription for azithromycin to take on his vacation.  If he were to have worsening symptoms or more productive sputum he could at this to his regimen.  Discussed importance of nasal sinus rinses and daily Flonase 1 spray each nostril twice a day.

## 2017-12-22 MED FILL — AZITHROMYCIN 250 MG TABLET: 250 | 5 days supply | Qty: 6 | Fill #0

## 2018-01-08 ENCOUNTER — Other Ambulatory Visit: Payer: Self-pay | Admitting: Physician Assistant

## 2018-01-08 MED FILL — ALLOPURINOL 300 MG TABS: 300 | 90 days supply | Qty: 90 | Fill #2

## 2018-01-08 MED FILL — OMEPRAZOLE DR 40 MG CAPSULE: 40 | 90 days supply | Qty: 90 | Fill #0

## 2018-01-10 ENCOUNTER — Telehealth: Payer: Self-pay | Admitting: *Deleted

## 2018-01-10 ENCOUNTER — Other Ambulatory Visit: Payer: Self-pay | Admitting: *Deleted

## 2018-01-10 DIAGNOSIS — E291 Testicular hypofunction: Secondary | ICD-10-CM

## 2018-01-10 NOTE — Telephone Encounter (Signed)
Testosterone lab ordered.  

## 2018-01-12 MED FILL — TESTOSTERONE CYP 200 MG/ML: 200 | 28 days supply | Qty: 2 | Fill #1

## 2018-02-07 MED FILL — BUPROPION HCL XL 300 MG TAB: 300 | 90 days supply | Qty: 90 | Fill #1

## 2018-02-07 MED FILL — FLUoxetine HCL 40 MG CAPS: 40 | 90 days supply | Qty: 90 | Fill #1

## 2018-02-15 ENCOUNTER — Telehealth: Payer: Self-pay | Admitting: Physician Assistant

## 2018-02-15 NOTE — Telephone Encounter (Signed)
Spouse states patient stops breathing in his sleep. Pt already has a CPAP machine and is using it nightly. However, it has been years since pt has had a sleep study and would like to have a new one ordered. Please advise. Thanks!

## 2018-02-16 ENCOUNTER — Other Ambulatory Visit: Payer: Self-pay | Admitting: Physician Assistant

## 2018-02-16 DIAGNOSIS — G4733 Obstructive sleep apnea (adult) (pediatric): Secondary | ICD-10-CM

## 2018-02-16 NOTE — Telephone Encounter (Signed)
Order placed

## 2018-02-20 ENCOUNTER — Telehealth: Payer: Self-pay | Admitting: Physician Assistant

## 2018-02-20 ENCOUNTER — Other Ambulatory Visit: Payer: Self-pay | Admitting: *Deleted

## 2018-02-20 MED ORDER — TESTOSTERONE CYPIONATE 200 MG/ML IM SOLN: 200.0000 mg | INTRAMUSCULAR | 0 refills | Status: DC

## 2018-02-20 MED FILL — BD NEEDLES 22GX1.5": 22G X 1-1/2 | 10 days supply | Qty: 10 | Fill #0

## 2018-02-20 MED FILL — BD NEEDLES 22GX1.5: 22G X 1-1/2 | 10 days supply | Qty: 10 | Fill #0

## 2018-02-20 MED FILL — BD 3 ML SYRINGE WITH NEEDLE: 23G X 1" | 90 days supply | Qty: 10 | Fill #0

## 2018-02-20 MED FILL — DEPO-TESTOSTERONE 200 MG/ML: 200 | 28 days supply | Qty: 2 | Fill #0

## 2018-02-20 NOTE — Telephone Encounter (Signed)
Sent!

## 2018-02-20 NOTE — Telephone Encounter (Signed)
Patient's spouse states that patient needs more testosterone. Please advise. Thanks!

## 2018-02-26 ENCOUNTER — Encounter: Payer: Self-pay | Admitting: Physician Assistant

## 2018-02-26 ENCOUNTER — Ambulatory Visit (INDEPENDENT_AMBULATORY_CARE_PROVIDER_SITE_OTHER): Payer: BLUE CROSS/BLUE SHIELD | Admitting: Physician Assistant

## 2018-02-26 VITALS — BP 131/88 | HR 97 | Temp 98.2°F | Wt 332.0 lb

## 2018-02-26 DIAGNOSIS — J4541 Moderate persistent asthma with (acute) exacerbation: Secondary | ICD-10-CM | POA: Diagnosis not present

## 2018-02-26 MED ORDER — FLUTICASONE FUROATE-VILANTEROL 100-25 MCG/INH IN AEPB: 1.0000 | INHALATION_SPRAY | Freq: Every day | RESPIRATORY_TRACT | 11 refills | Status: DC

## 2018-02-26 MED ORDER — PREDNISONE 50 MG PO TABS: 50.0000 mg | ORAL_TABLET | Freq: Every day | ORAL | 0 refills | Status: DC

## 2018-02-26 MED ORDER — AZITHROMYCIN 250 MG PO TABS: ORAL_TABLET | ORAL | 0 refills | Status: DC

## 2018-02-26 MED FILL — predniSONE 50 MG TABS: 50 | 5 days supply | Qty: 5 | Fill #0

## 2018-02-26 MED FILL — BREO ELLIPTA 100-25 MCG INH: 100-25 | 30 days supply | Qty: 60 | Fill #0

## 2018-02-26 MED FILL — AZITHROMYCIN 250 MG TABLET: 250 | 5 days supply | Qty: 6 | Fill #0

## 2018-02-26 NOTE — Progress Notes (Signed)
HPI:                                                                Elijah Ford is a 42 y.o. male who presents to [facility]: [place] today for wheezing  Cough  This is a new problem. The current episode started in the past 7 days. The problem has been gradually worsening. The problem occurs every few minutes. The cough is non-productive. Associated symptoms include ear pain, a fever, nasal congestion, a sore throat, shortness of breath and wheezing. Pertinent negatives include no chest pain. Nothing aggravates the symptoms. He has tried a beta-agonist inhaler (using nebulizer every 3-4 hours) for the symptoms. The treatment provided moderate relief. His past medical history is significant for asthma.       No flowsheet data found.    Past Medical History:  Diagnosis Date  . Anxiety   . Arthritis    both feet, soreness in multiple areas, plantar fascitis, uses orthodics in both shoes   . Depression    counselling in place, sporadically   . GERD (gastroesophageal reflux disease)   . Hyperlipidemia   . Hypertension    was in the past   . Pneumonia 2015  . Seasonal allergies   . Sleep apnea    CPAP q night    Past Surgical History:  Procedure Laterality Date  . EYE SURGERY     lasik- both   . INGUINAL HERNIA REPAIR Left 07/21/2016   Procedure: REPAIR LEFT INGUINAL HERNIA;  Surgeon: Erroll Luna, MD;  Location: [facility];  Service: General;  Laterality: Left;  . INSERTION OF MESH Left 07/21/2016   Procedure: INSERTION OF MESH;  Surgeon: Erroll Luna, MD;  Location: [facility];  Service: General;  Laterality: Left;  . [place]  . TONSILECTOMY/ADENOIDECTOMY WITH MYRINGOTOMY     no myringotomy   . uvula removal     multiple surgeries for sinuses due to sleep apnea  . VASECTOMY    . VENTRAL HERNIA REPAIR N/A 07/21/2016   Procedure: REPAIR  VENTRAL  HERNIA X 2;  Surgeon: Erroll Luna, MD;  Location: [facility];  Service:  General;  Laterality: N/A;   Social History   Tobacco Use  . Smoking status: Never Smoker  . Smokeless tobacco: Never Used  Substance Use Topics  . Alcohol use: No   family history includes Alcohol abuse in his brother and brother; Cancer in his father; Depression in his brother, brother, and mother.    ROS: negative except as noted in the HPI  Medications: Current Outpatient Medications  Medication Sig Dispense Refill  . albuterol (PROVENTIL) (2.5 MG/3ML) 0.083% nebulizer solution Take 3 mLs (2.5 mg total) by nebulization every 6 (six) hours as needed for wheezing or shortness of breath. 30 vial 6  . allopurinol (ZYLOPRIM) 300 MG tablet TAKE 1 TABLET (300 MG TOTAL) BY MOUTH DAILY. 90 tablet 4  . buPROPion (WELLBUTRIN XL) 300 MG 24 hr tablet TAKE 1 TABLET (300 MG TOTAL) BY MOUTH DAILY 90 tablet 0  . FLUoxetine (PROZAC) 40 MG capsule Take 1 capsule (40 mg total) by mouth daily. 90 capsule 0  . montelukast (SINGULAIR) 10 MG tablet Take 1 tablet (10 mg total) by mouth at  bedtime. 90 tablet 3  . omeprazole (PRILOSEC) 40 MG capsule Take 1 capsule (40 mg total) by mouth daily. 90 capsule 4  . testosterone cypionate (DEPOTESTOSTERONE CYPIONATE) 200 MG/ML injection Inject 1 mL (200 mg total) into the muscle every 14 (fourteen) days. Please include syringes and 22G 1.5" needles 10 mL 0   No current facility-administered medications for this visit.    Allergies  Allergen Reactions  . Other Itching    MELONS       Objective:  BP 131/88   Pulse 97   Temp 98.2 F (36.8 C) (Oral)   Wt (!) 332 lb (150.6 kg)   SpO2 98%   BMI 36.47 kg/m  Gen:  alert, not ill-appearing, no distress, appropriate for age 65: head normocephalic without obvious abnormality, conjunctiva and cornea clear, oropharynx with erythema, no exudates, uvula midline, neck supple, no cervical adenopathy, trachea midline Pulm: Normal work of breathing, normal phonation, end expiratory wheeze in the RLL, otherwise  clear to auscultation CV: Normal rate, regular rhythm, s1 and s2 distinct, no murmurs, clicks or rubs  Neuro: alert and oriented x 3, no tremor MSK: extremities atraumatic, normal gait and station Skin: intact, no rashes on exposed skin, no jaundice, no cyanosis    No results found for this or any previous visit (from the past 72 hour(s)). No results found.    Assessment and Plan: 42 y.o. male with   Moderate persistent asthma with acute exacerbation - Plan: fluticasone furoate-vilanterol (BREO ELLIPTA) 100-25 MCG/INH AEPB, predniSONE (DELTASONE) 50 MG tablet, azithromycin (ZITHROMAX Z-PAK) 250 MG tablet - afebrile, no tachypnea, no tachycardia, SpO2 98% on RA, end expiratory wheeze RLL - treating for acute exacerbation with steroid burst and azithromycin - this is his second exacerbation this year. Starting [facility] for controller - cont bronchodilator Q4H prn and QHS    Patient education and anticipatory guidance given Patient agrees with treatment plan Follow-up in 1 week w/PCP for asthma as needed if symptoms worsen or fail to improve  Darlyne Russian PA-C

## 2018-02-26 NOTE — Patient Instructions (Signed)
- start Breo 1 puff daily and continue using daily for asthma control. Continue your Singulair - prednisone and azithromcyin for 5 days - continue nebulizer every 4 hours as needed for wheezing/SOB and at bedtime    Bronchospasm, Adult Bronchospasm is a tightening of the airways going into the lungs. During an episode, it may be harder to breathe. You may cough, and you may make a whistling sound when you breathe (wheeze). This condition often affects people with asthma. What are the causes? This condition is caused by swelling and irritation in the airways. It can be triggered by:  An infection (common).  Seasonal allergies.  An allergic reaction.  Exercise.  Irritants. These include pollution, cigarette smoke, strong odors, aerosol sprays, and paint fumes.  Weather changes. Winds increase molds and pollens in the air. Cold air may cause swelling.  Stress and emotional upset.  What are the signs or symptoms? Symptoms of this condition include:  Wheezing. If the episode was triggered by an allergy, wheezing may start right away or hours later.  Nighttime coughing.  Frequent or severe coughing with a simple cold.  Chest tightness.  Shortness of breath.  Decreased ability to exercise.  How is this diagnosed? This condition is usually diagnosed with a review of your medical history and a physical exam. Tests, such as lung function tests, are sometimes done to look for other conditions. The need for a chest X-ray depends on where the wheezing occurs and whether it is the first time you have wheezed. How is this treated? This condition may be treated with:  Inhaled medicines. These open up the airways and help you breathe. They can be taken with an inhaler or a nebulizer device.  Corticosteroid medicines. These may be given for severe bronchospasm, usually when it is associated with asthma.  Avoiding triggers, such as irritants, infection, or allergies.  Follow these  instructions at home: Medicines  Take over-the-counter and prescription medicines only as told by your health care provider.  If you need to use an inhaler or nebulizer to take your medicine, ask your health care provider to explain how to use it correctly. If you were given a spacer, always use it with your inhaler. Lifestyle  Reduce the number of triggers in your home. To do this: ? Change your heating and air conditioning filter at least once a month. ? Limit your use of fireplaces and wood stoves. ? Do not smoke. Do not allow smoking in your home. ? Avoid using perfumes and fragrances. ? Get rid of pests, such as roaches and mice, and their droppings. ? Remove any mold from your home. ? Keep your house clean and dust free. Use unscented cleaning products. ? Replace carpet with wood, tile, or vinyl flooring. Carpet can trap dander and dust. ? Use allergy-proof pillows, mattress covers, and box spring covers. ? Wash bed sheets and blankets every week in hot water. Dry them in a dryer. ? Use blankets that are made of polyester or cotton. ? Wash your hands often. ? Do not allow pets in your bedroom.  Avoid breathing in cold air when you exercise. General instructions  Have a plan for seeking medical care. Know when to call your health care provider and local emergency services, and where to get emergency care.  Stay up to date on your immunizations.  When you have an episode of bronchospasm, stay calm. Try to relax and breathe more slowly.  If you have asthma, make sure you have an  asthma action plan.  Keep all follow-up visits as told by your health care provider. This is important. Contact a health care provider if:  You have muscle aches.  You have chest pain.  The mucus that you cough up (sputum) changes from clear or white to yellow, green, gray, or bloody.  You have a fever.  Your sputum gets thicker. Get help right away if:  Your wheezing and coughing get worse,  even after you take your prescribed medicines.  It gets even harder to breathe.  You develop severe chest pain. Summary  Bronchospasm is a tightening of the airways going into the lungs.  During an episode of bronchospasm, you may have a harder time breathing. You may cough and make a whistling sound when you breathe (wheeze).  Avoid exposure to triggers such as smoke, dust, mold, animal dander, and fragrances.  When you have an episode of bronchospasm, stay calm. Try to relax and breathe more slowly. This information is not intended to replace advice given to you by your health care provider. Make sure you discuss any questions you have with your health care provider. Document Released: 09/08/2003 Document Revised: 09/01/2016 Document Reviewed: 09/01/2016 Elsevier Interactive Patient Education  2017 [employer].

## 2018-03-05 ENCOUNTER — Ambulatory Visit (INDEPENDENT_AMBULATORY_CARE_PROVIDER_SITE_OTHER): Payer: BLUE CROSS/BLUE SHIELD

## 2018-03-05 ENCOUNTER — Encounter: Payer: Self-pay | Admitting: Physician Assistant

## 2018-03-05 ENCOUNTER — Ambulatory Visit (INDEPENDENT_AMBULATORY_CARE_PROVIDER_SITE_OTHER): Payer: BLUE CROSS/BLUE SHIELD | Admitting: Physician Assistant

## 2018-03-05 VITALS — BP 147/91 | HR 85 | Temp 98.5°F | Wt 331.0 lb

## 2018-03-05 DIAGNOSIS — R0602 Shortness of breath: Secondary | ICD-10-CM

## 2018-03-05 DIAGNOSIS — J9811 Atelectasis: Secondary | ICD-10-CM | POA: Diagnosis not present

## 2018-03-05 DIAGNOSIS — H9201 Otalgia, right ear: Secondary | ICD-10-CM | POA: Diagnosis not present

## 2018-03-05 DIAGNOSIS — R0789 Other chest pain: Secondary | ICD-10-CM

## 2018-03-05 DIAGNOSIS — K760 Fatty (change of) liver, not elsewhere classified: Secondary | ICD-10-CM | POA: Diagnosis not present

## 2018-03-05 DIAGNOSIS — R0981 Nasal congestion: Secondary | ICD-10-CM | POA: Diagnosis not present

## 2018-03-05 MED ORDER — IOPAMIDOL (ISOVUE-370) INJECTION 76%
100.0000 mL | Freq: Once | INTRAVENOUS | Status: AC | PRN
  Administered 2018-03-05: 100 mL via INTRAVENOUS

## 2018-03-05 MED ORDER — METHYLPREDNISOLONE ACETATE 40 MG/ML IJ SUSP
40.0000 mg | Freq: Once | INTRAMUSCULAR | Status: AC
Start: 2018-03-05 — End: 2018-03-05
  Administered 2018-03-05: 40 mg via INTRAMUSCULAR

## 2018-03-05 MED ORDER — FLUTICASONE PROPIONATE 50 MCG/ACT NA SUSP: 2.0000 | Freq: Every day | NASAL | 5 refills | Status: DC

## 2018-03-05 MED ORDER — LEVOFLOXACIN 500 MG PO TABS: 500.0000 mg | ORAL_TABLET | Freq: Every day | ORAL | 0 refills | Status: DC

## 2018-03-05 MED ORDER — PREDNISONE 20 MG PO TABS: ORAL_TABLET | ORAL | 0 refills | Status: DC

## 2018-03-05 MED FILL — FLUTICASONE PROP 50 MCG SPR: 50 | 30 days supply | Qty: 16 | Fill #0

## 2018-03-05 MED FILL — predniSONE 20 MG TABS: 20 | 13 days supply | Qty: 21 | Fill #0

## 2018-03-05 MED FILL — levoFLOXacin 500 MG TABS: 500 | 10 days supply | Qty: 10 | Fill #0

## 2018-03-05 NOTE — Progress Notes (Signed)
Subjective:    Patient ID: Elijah Ford, male    DOB: [birth year], 42 y.o.   MRN: [id number]  HPI  Patient is a 42 yo male with a pmhx of asthma who is presenting to clinic for persistent cough, congestion, and SHOB. He was last seen in clinic 7 days ago and treated for an asthma exacerbation. He has completed his steroid and antibiotic. He was also started on Breo which he has been using daily. He states his symptoms improved briefly before worsening again. He complains of cough, sinus congestion, right ear pain, SHOB, and chest tightness. Chest tightness began 2 days ago and is worsening. Symptoms do not interfere with sleep. He uses his nebulizer 2-3 times per day without improvement of symptoms. He states his [facility] is worse with activity and not at baseline. He denies peripheral edema.  He is using OTC sinus headache medication.  .. Active Ambulatory Problems    Diagnosis Date Noted  . Essential hypertension, benign 11/04/2013  . Anxiety state 11/04/2013  . Depression 11/04/2013  . Obesity 11/04/2013  . GERD (gastroesophageal reflux disease) 11/04/2013  . Tarsometatarsal osteoarthritis of both feet 06/20/2014  . Bilateral extensor hallucis longus tendinitis 06/27/2014  . Metatarsal stress fracture of left foot 07/08/2014  . Prediabetes 07/09/2014  . Gout 07/09/2014  . Asthma, moderate persistent 11/26/2014  . Eosinophilia 06/26/2015  . Hypogonadism male 07/06/2015  . Left ear pain 12/02/2015  . Plantar fasciitis, left 03/09/2016  . Elevated hematocrit 04/06/2016  . Ventral hernia without obstruction or gangrene 06/01/2016  . Plantar fascial fibromatosis of left foot 12/14/2016  . Bilateral metatarsal stress injury 05/09/2017  . Sleep apnea 07/18/2017  . Hyperlipidemia 07/18/2017  . Vitamin D deficiency 07/18/2017  . Elevated serum creatinine 07/18/2017  . Low serum vitamin B12 07/18/2017   Resolved Ambulatory Problems    Diagnosis Date Noted  . Tinea pedis 06/27/2014  .  Acute bronchitis 06/27/2014  . CAP (community acquired pneumonia) 09/18/2014  . Pneumonia 09/18/2014  . Bronchitis, asthmatic 10/15/2014  . Asthma with acute exacerbation 11/26/2014  . Skin lesion of right lower extremity 03/13/2015   Past Medical History:  Diagnosis Date  . Anxiety   . Arthritis   . Depression   . GERD (gastroesophageal reflux disease)   . Hyperlipidemia   . Hypertension   . Pneumonia 2015  . Seasonal allergies   . Sleep apnea       Review of Systems  All other systems reviewed and are negative.      Objective:   Physical Exam  Constitutional: He appears well-developed.  HENT:  Head: Normocephalic and atraumatic.  Right Ear: External ear and ear canal normal. There is tenderness. Tympanic membrane is erythematous. Tympanic membrane is not injected, not retracted and not bulging.  Left Ear: Tympanic membrane, external ear and ear canal normal.  Nose: Mucosal edema present. Right sinus exhibits no maxillary sinus tenderness and no frontal sinus tenderness. Left sinus exhibits no maxillary sinus tenderness and no frontal sinus tenderness.  Mouth/Throat: Oropharynx is clear and moist.  Eyes: Pupils are equal, round, and reactive to light. Conjunctivae are normal.  Neck: Normal range of motion.  Cardiovascular: Normal rate, regular rhythm and normal heart sounds. Exam reveals no gallop and no friction rub.  No murmur heard. Pulmonary/Chest: He has no wheezes.  Coarse breath sounds. No wheezes, rhonchi, or rales auscultated. Breathing labored. Increased SHOB and chest tightness when reclining on exam.  Lymphadenopathy:    He has no cervical adenopathy.  Skin: He is not diaphoretic.  Negative for any peripheral edema.       Assessment & Plan:  Elijah Ford was seen today for cough.  Diagnoses and all orders for this visit:  Chest tightness -     predniSONE (DELTASONE) 20 MG tablet; Take 3 tablets for 3 days, take 2 tablets for 3 days, take 1 tablet for 3  days, take 1/2 tablet for 4 days. -     levofloxacin (LEVAQUIN) 500 MG tablet; Take 1 tablet (500 mg total) by mouth daily. For 10 days. -     methylPREDNISolone acetate (DEPO-MEDROL) injection 40 mg  SOB (shortness of breath)  Shortness of breath -     predniSONE (DELTASONE) 20 MG tablet; Take 3 tablets for 3 days, take 2 tablets for 3 days, take 1 tablet for 3 days, take 1/2 tablet for 4 days. -     levofloxacin (LEVAQUIN) 500 MG tablet; Take 1 tablet (500 mg total) by mouth daily. For 10 days. -     CT Angio Chest W/Cm &/Or Wo Cm -     methylPREDNISolone acetate (DEPO-MEDROL) injection 40 mg  Nasal congestion -     predniSONE (DELTASONE) 20 MG tablet; Take 3 tablets for 3 days, take 2 tablets for 3 days, take 1 tablet for 3 days, take 1/2 tablet for 4 days. -     fluticasone (FLONASE) 50 MCG/ACT nasal spray; Place 2 sprays into both nostrils daily.  Atelectasis -     predniSONE (DELTASONE) 20 MG tablet; Take 3 tablets for 3 days, take 2 tablets for 3 days, take 1 tablet for 3 days, take 1/2 tablet for 4 days. -     levofloxacin (LEVAQUIN) 500 MG tablet; Take 1 tablet (500 mg total) by mouth daily. For 10 days.  Right ear pain   - SHOB and chest tightness: Wells criteria gives low risk for PE but given persistent symptoms despite treatment, new onset chest tightness, and inc SHOB not improved with neb - CT to rule out. EKG to rule out cardiac etiology - NSR without ST elevation/depression or arrhythmia  STAT CT: No evidence of pulmonary embolism.  Minimal atelectasis scattered in both lower lobes.  Fatty infiltration of liver.  Remainder of exam unremarkable.  Add flonase and alternating Tylenol and NSAIDs for symptoms relief. Steroid injection today and taper given to decrease inflammation and improve symptoms.  Prescription for Levoquin given but discussed with patient to hold off on taking for now. Given symptoms persisted despite azithromycin use, infection less likely.  He will take it if his symptoms continue to worsen or fever develops despite prednisone.   Allergy testing - Discussed with patient recommendation for allergy testing as he has a history of allergies. SHOB not responding to nebulizer treatment and failure of marked improvement with symbicort and breo makes this more likely.

## 2018-03-05 NOTE — Patient Instructions (Signed)
Allergy testing.  Continue singulair and OTC zyrtec/claritin.  Start flonase.  Start longer prednisone taper.  Add levaquin if not improving in next 48 hours.

## 2018-03-05 NOTE — Progress Notes (Signed)
Call patient. Great news no PE. Continue with treatment plan as discussed in office.

## 2018-03-13 NOTE — Addendum Note (Signed)
Addended by: Huel Cote on: 03/13/2018 03:12 PM   Modules accepted: Orders

## 2018-04-09 ENCOUNTER — Ambulatory Visit (HOSPITAL_BASED_OUTPATIENT_CLINIC_OR_DEPARTMENT_OTHER): Payer: BLUE CROSS/BLUE SHIELD | Attending: Physician Assistant | Admitting: Internal Medicine

## 2018-04-09 VITALS — Ht >= 80 in | Wt 320.0 lb

## 2018-04-09 DIAGNOSIS — G4733 Obstructive sleep apnea (adult) (pediatric): Secondary | ICD-10-CM | POA: Diagnosis not present

## 2018-04-17 ENCOUNTER — Other Ambulatory Visit: Payer: Self-pay | Admitting: Physician Assistant

## 2018-04-17 MED ORDER — AMBULATORY NON FORMULARY MEDICATION: 0 refills | Status: DC

## 2018-04-18 DIAGNOSIS — G4733 Obstructive sleep apnea (adult) (pediatric): Secondary | ICD-10-CM | POA: Diagnosis not present

## 2018-04-21 DIAGNOSIS — G4733 Obstructive sleep apnea (adult) (pediatric): Secondary | ICD-10-CM | POA: Diagnosis not present

## 2018-04-21 NOTE — Procedures (Signed)
Patient Name: Elijah Ford, Elijah Ford Date: 04/09/2018 Gender: Male D.O.B: 12/05/1975 Age (years): 74 Referring Provider: Iran Planas Height (inches): 80 Interpreting Physician: Baird Lyons MD, ABSM Weight (lbs): 320 RPSGT: Jorge Ny BMI: 35 MRN: [id number] Neck Size: 20.25  CLINICAL INFORMATION Sleep Study Type: Split Night CPAP Indication for sleep study: Hypertension, Obesity, OSA  Epworth Sleepiness Score: 12  SLEEP STUDY TECHNIQUE As per the AASM Manual for the Scoring of Sleep and Associated Events v2.3 (April 2016) with a hypopnea requiring 4% desaturations.  The channels recorded and monitored were frontal, central and occipital EEG, electrooculogram (EOG), submentalis EMG (chin), nasal and oral airflow, thoracic and abdominal wall motion, anterior tibialis EMG, snore microphone, electrocardiogram, and pulse oximetry. Continuous positive airway pressure (CPAP) was initiated when the patient met split night criteria and was titrated according to treat sleep-disordered breathing.  MEDICATIONS Medications self-administered by patient taken the night of the study : none reported  RESPIRATORY PARAMETERS Diagnostic  Total AHI (/hr): 74.8 RDI (/hr): 83.1 OA Index (/hr): 62.9 CA Index (/hr): 0.0 REM AHI (/hr): N/A NREM AHI (/hr): 74.8 Supine AHI (/hr): 77.7 Non-supine AHI (/hr): 58.5 Min O2 Sat (%): 85.0 Mean O2 (%): 92.9 Time below 88% (min): 5.9   Titration  Optimal Pressure (cm):  AHI at Optimal Pressure (/hr): N/A Min O2 at Optimal Pressure (%): 90.0 Supine % at Optimal (%): N/A Sleep % at Optimal (%): N/A   SLEEP ARCHITECTURE The recording time for the entire night was 429.4 minutes.  During a baseline period of 188.3 minutes, the patient slept for 130.7 minutes in REM and nonREM, yielding a sleep efficiency of 69.4%%. Sleep onset after lights out was 50.1 minutes with a REM latency of N/A minutes. The patient spent 4.2%% of the night in stage N1 sleep,  95.8%% in stage N2 sleep, 0.0%% in stage N3 and 0% in REM.  During the titration period of 235.1 minutes, the patient slept for 232.5 minutes in REM and nonREM, yielding a sleep efficiency of 98.9%%. Sleep onset after CPAP initiation was 1.2 minutes with a REM latency of 71.5 minutes. The patient spent 1.3%% of the night in stage N1 sleep, 69.2%% in stage N2 sleep, 0.0%% in stage N3 and 29.5% in REM.  CARDIAC DATA The 2 lead EKG demonstrated sinus rhythm. The mean heart rate was 100.0 beats per minute. Other EKG findings include: None.  LEG MOVEMENT DATA The total Periodic Limb Movements of Sleep (PLMS) were 0. The PLMS index was 0.0 .  IMPRESSIONS - Severe obstructive sleep apnea occurred during the diagnostic portion of the study (AHI = 74.8/hour). An optimal PAP pressure was selected, 11 cwp. - No significant central sleep apnea occurred during the diagnostic portion of the study (CAI = 0.0/hour). - The patient had oxygen desaturation during the diagnostic portion of the study (Min O2 = 85.0%). Min O2 sat at CPAP 11 was 92.0%. - The patient snored with moderate snoring volume during the diagnostic portion of the study. - No cardiac abnormalities were noted during this study. - Clinically significant periodic limb movements did not occur during sleep.  DIAGNOSIS - Obstructive Sleep Apnea (327.23 [G47.33 ICD-10])  RECOMMENDATIONS - Recommend CPAP 11 or DME autopap 5-15. The patient wore a large REsMed AirFit F20 mask with cooled humidity. - Be careful with alcohol, sedatives and other CNS depressants that may worsen sleep apnea and disrupt normal sleep architecture. - Sleep hygiene should be reviewed to assess factors that may improve sleep quality. - Weight management and  regular exercise should be initiated or continued.  [Electronically signed] 04/21/2018 02:13 PM  Baird Lyons MD, [place], American Board of Sleep Medicine   NPI: [id number]                           [place], [place] [facility]  ELECTRONICALLY SIGNED ON:  04/21/2018, 2:08 PM [place] PH: [phone]   FX: [phone] [place]

## 2018-04-24 MED FILL — BREO ELLIPTA 100-25 MCG INH: 100-25 | 30 days supply | Qty: 60 | Fill #1

## 2018-04-24 MED FILL — MONTELUKAST SOD 10 MG TAB: 10 | 90 days supply | Qty: 90 | Fill #1

## 2018-04-24 MED FILL — OMEPRAZOLE 40 MG CPDR: 40 | 90 days supply | Qty: 90 | Fill #1

## 2018-04-24 MED FILL — ALLOPURINOL 300 MG TABLET: 300 | 90 days supply | Qty: 90 | Fill #3

## 2018-05-19 DIAGNOSIS — G4733 Obstructive sleep apnea (adult) (pediatric): Secondary | ICD-10-CM | POA: Diagnosis not present

## 2018-05-22 MED FILL — buPROPion HCL ER (XL) 300 M: 300 | 90 days supply | Qty: 90 | Fill #2

## 2018-05-22 MED FILL — FLUoxetine HCL 40 MG CAPS: 40 | 90 days supply | Qty: 90 | Fill #2

## 2018-05-25 MED FILL — FLUTICASONE PROP 50 MCG SPR: 50 | 30 days supply | Qty: 16 | Fill #1

## 2018-05-29 ENCOUNTER — Ambulatory Visit (INDEPENDENT_AMBULATORY_CARE_PROVIDER_SITE_OTHER): Payer: BLUE CROSS/BLUE SHIELD | Admitting: Physician Assistant

## 2018-05-29 ENCOUNTER — Encounter: Payer: Self-pay | Admitting: Physician Assistant

## 2018-05-29 VITALS — BP 112/66 | HR 63 | Ht >= 80 in | Wt 325.0 lb

## 2018-05-29 DIAGNOSIS — Z131 Encounter for screening for diabetes mellitus: Secondary | ICD-10-CM

## 2018-05-29 DIAGNOSIS — F331 Major depressive disorder, recurrent, moderate: Secondary | ICD-10-CM | POA: Diagnosis not present

## 2018-05-29 DIAGNOSIS — G4733 Obstructive sleep apnea (adult) (pediatric): Secondary | ICD-10-CM | POA: Diagnosis not present

## 2018-05-29 DIAGNOSIS — E291 Testicular hypofunction: Secondary | ICD-10-CM

## 2018-05-29 DIAGNOSIS — Z23 Encounter for immunization: Secondary | ICD-10-CM

## 2018-05-29 DIAGNOSIS — Z13220 Encounter for screening for lipoid disorders: Secondary | ICD-10-CM | POA: Diagnosis not present

## 2018-05-29 DIAGNOSIS — E782 Mixed hyperlipidemia: Secondary | ICD-10-CM

## 2018-05-29 DIAGNOSIS — Z79899 Other long term (current) drug therapy: Secondary | ICD-10-CM

## 2018-05-29 MED ORDER — FLUOXETINE HCL 40 MG PO CAPS: 40.0000 mg | ORAL_CAPSULE | Freq: Every day | ORAL | 1 refills | Status: DC

## 2018-05-29 MED ORDER — BUPROPION HCL ER (XL) 300 MG PO TB24: ORAL_TABLET | ORAL | 1 refills | Status: DC

## 2018-05-29 MED ORDER — TESTOSTERONE CYPIONATE 200 MG/ML IM SOLN
200.0000 mg | Freq: Once | INTRAMUSCULAR | Status: AC
  Administered 2018-05-29: 200 mg via INTRAMUSCULAR

## 2018-05-29 NOTE — Progress Notes (Signed)
Subjective:    Patient ID: Elijah Ford, male    DOB: August 12, 1976, 42 y.o.   MRN: [id number]  HPI  Pt is a 42 yo male with OSA, HTN, asthma, HLD GERD, hypogonadism who presents to the clinic for follow up on new start CPAP.   He has been using CPAP the new machine for last month with [employer]. I do not have offical complaince report but he does report to use every night at least 6 hours. The first few nights the energy was amazing. The energy has tapered off but feels great.   Pt is on testosterone but been a few weeks since shot. Needs refills.   Overall patient loves his job. 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 His mood is wonderful. He denies any anxiety or depression.   He request refills.   .. Active Ambulatory Problems    Diagnosis Date Noted  . Essential hypertension, benign 11/04/2013  . Anxiety state 11/04/2013  . Depression 11/04/2013  . Obesity 11/04/2013  . GERD (gastroesophageal reflux disease) 11/04/2013  . Tarsometatarsal osteoarthritis of both feet 06/20/2014  . Bilateral extensor hallucis longus tendinitis 06/27/2014  . Metatarsal stress fracture of left foot 07/08/2014  . Prediabetes 07/09/2014  . Gout 07/09/2014  . Asthma, moderate persistent 11/26/2014  . Eosinophilia 06/26/2015  . Male hypogonadism 07/06/2015  . Left ear pain 12/02/2015  . Plantar fasciitis, left 03/09/2016  . Elevated hematocrit 04/06/2016  . Ventral hernia without obstruction or gangrene 06/01/2016  . Plantar fascial fibromatosis of left foot 12/14/2016  . Bilateral metatarsal stress injury 05/09/2017  . Sleep apnea 07/18/2017  . Hyperlipidemia 07/18/2017  . Vitamin D deficiency 07/18/2017  . Elevated serum creatinine 07/18/2017  . Low serum vitamin B12 07/18/2017   Resolved Ambulatory Problems    Diagnosis Date Noted  . Tinea pedis 06/27/2014  . Acute bronchitis 06/27/2014  . CAP (community acquired pneumonia) 09/18/2014  . Pneumonia 09/18/2014  .  Bronchitis, asthmatic 10/15/2014  . Asthma with acute exacerbation 11/26/2014  . Skin lesion of right lower extremity 03/13/2015   Past Medical History:  Diagnosis Date  . Anxiety   . Arthritis   . Hypertension   . Seasonal allergies       Review of Systems  All other systems reviewed and are negative.      Objective:   Physical Exam  Constitutional: He is oriented to person, place, and time. He appears well-developed and well-nourished.  HENT:  Head: Normocephalic and atraumatic.  Cardiovascular: Normal rate and regular rhythm.  Pulmonary/Chest: Effort normal and breath sounds normal.  Neurological: He is alert and oriented to person, place, and time.  Psychiatric: He has a normal mood and affect. His behavior is normal.          Assessment & Plan:  Marland KitchenMarland KitchenDiagnoses and all orders for this visit:  Obstructive sleep apnea syndrome  Moderate episode of recurrent major depressive disorder (HCC) -     FLUoxetine (PROZAC) 40 MG capsule; Take 1 capsule (40 mg total) by mouth daily. -     buPROPion (WELLBUTRIN XL) 300 MG 24 hr tablet; TAKE 1 TABLET (300 MG TOTAL) BY MOUTH DAILY  Male hypogonadism -     Testosterone -     PSA -     CBC with Differential/Platelet -     testosterone cypionate (DEPOTESTOSTERONE CYPIONATE) injection 200 mg  Screening for lipid disorders -     Lipid Panel w/reflex Direct LDL  Screening for diabetes  mellitus -     COMPLETE METABOLIC PANEL WITH GFR  Medication management -     COMPLETE METABOLIC PANEL WITH GFR  Mixed hyperlipidemia  Need for immunization against influenza -     Flu Vaccine QUAD 36+ mos IM  .Marland Kitchen Depression screen [facility] 2/9 05/29/2018 07/18/2017 05/05/2017 12/19/2013 11/04/2013  Decreased Interest 0 0 3 1 1   Down, Depressed, Hopeless 0 0 3 2 1   PHQ - 2 Score 0 0 6 3 2   Altered sleeping 0 - 3 1 2   Tired, decreased energy 2 - 3 1 1   Change in appetite 2 - 3 0 1  Feeling bad or failure about yourself  0 - 3 3 3   Trouble  concentrating 0 - 3 3 1   Moving slowly or fidgety/restless 0 - 2 2 1   Suicidal thoughts 0 - 0 0 0  PHQ-9 Score 4 - 23 13 11   Difficult doing work/chores Not difficult at all - - - -   .Marland Kitchen GAD 7 : Generalized Anxiety Score 05/29/2018  Nervous, Anxious, on Edge 1  Control/stop worrying 0  Worry too much - different things 1  Trouble relaxing 1  Restless 0  Easily annoyed or irritable 0  Afraid - awful might happen 0  Total GAD 7 Score 3  Anxiety Difficulty Not difficult at all    Refilled medications.   Very good response to CPAP he is using greater than 70 percent of the time and longer than 4hrs. I will get official compliance report as well. His energy, mood, and bP look great.   Testosterone shot given today because he was due. Get back on every 2 weeks.   Fasting labs ordered.

## 2018-05-31 ENCOUNTER — Encounter: Payer: Self-pay | Admitting: Physician Assistant

## 2018-05-31 ENCOUNTER — Telehealth: Payer: Self-pay | Admitting: Physician Assistant

## 2018-05-31 NOTE — Telephone Encounter (Signed)
Please send last office note with compliance report to [employer] for insurance approval.

## 2018-06-04 NOTE — Telephone Encounter (Signed)
Done

## 2018-06-13 ENCOUNTER — Ambulatory Visit (INDEPENDENT_AMBULATORY_CARE_PROVIDER_SITE_OTHER): Payer: BLUE CROSS/BLUE SHIELD | Admitting: Physician Assistant

## 2018-06-13 ENCOUNTER — Encounter: Payer: Self-pay | Admitting: Physician Assistant

## 2018-06-13 VITALS — BP 132/78 | HR 89 | Temp 98.5°F | Ht >= 80 in | Wt 324.0 lb

## 2018-06-13 DIAGNOSIS — R0981 Nasal congestion: Secondary | ICD-10-CM | POA: Diagnosis not present

## 2018-06-13 DIAGNOSIS — J309 Allergic rhinitis, unspecified: Secondary | ICD-10-CM | POA: Diagnosis not present

## 2018-06-13 DIAGNOSIS — J4540 Moderate persistent asthma, uncomplicated: Secondary | ICD-10-CM

## 2018-06-13 DIAGNOSIS — J302 Other seasonal allergic rhinitis: Secondary | ICD-10-CM | POA: Diagnosis not present

## 2018-06-13 DIAGNOSIS — J301 Allergic rhinitis due to pollen: Secondary | ICD-10-CM

## 2018-06-13 MED ORDER — METHYLPREDNISOLONE SODIUM SUCC 125 MG IJ SOLR
125.0000 mg | Freq: Once | INTRAMUSCULAR | Status: AC
  Administered 2018-06-13: 125 mg via INTRAMUSCULAR

## 2018-06-13 MED ORDER — AZITHROMYCIN 250 MG PO TABS: ORAL_TABLET | ORAL | 0 refills | Status: DC

## 2018-06-13 NOTE — Progress Notes (Signed)
Subjective:     Patient ID: Elijah Ford, male   DOB: 02/26/1976, 42 y.o.   MRN: [id number]  HPI Patient is a 42 yo male with a history of moderate persistent asthma presenting today with some congestion and a cough. He states the symptoms started 2 days ago and have been constant. He admits to sinus pressure that he states he feels in the back of his sinuses and a headache. He admits to a cough, but denies any wheezing, SOB, or chest pain. He states he has some postnasal drip. He denies ear pain. He states that he woke up feeling clammy last night, but does not complain of any trouble sleeping. He will wake up from coughing, but is able to go back to sleep. He has tried taking DayQuil yesterday which he says did not help. He uses Flonase daily as well as an inhaler. He is also on Singulair and claritin daily for ongoing allergies.  He denies any sick contacts.   .. Active Ambulatory Problems    Diagnosis Date Noted  . Essential hypertension, benign 11/04/2013  . Anxiety state 11/04/2013  . Depression 11/04/2013  . Obesity 11/04/2013  . GERD (gastroesophageal reflux disease) 11/04/2013  . Tarsometatarsal osteoarthritis of both feet 06/20/2014  . Bilateral extensor hallucis longus tendinitis 06/27/2014  . Metatarsal stress fracture of left foot 07/08/2014  . Prediabetes 07/09/2014  . Gout 07/09/2014  . Asthma, moderate persistent 11/26/2014  . Eosinophilia 06/26/2015  . Male hypogonadism 07/06/2015  . Left ear pain 12/02/2015  . Plantar fasciitis, left 03/09/2016  . Elevated hematocrit 04/06/2016  . Ventral hernia without obstruction or gangrene 06/01/2016  . Plantar fascial fibromatosis of left foot 12/14/2016  . Bilateral metatarsal stress injury 05/09/2017  . Sleep apnea 07/18/2017  . Hyperlipidemia 07/18/2017  . Vitamin D deficiency 07/18/2017  . Elevated serum creatinine 07/18/2017  . Low serum vitamin B12 07/18/2017  . Seasonal allergies 06/13/2018   Resolved Ambulatory  Problems    Diagnosis Date Noted  . Tinea pedis 06/27/2014  . Acute bronchitis 06/27/2014  . CAP (community acquired pneumonia) 09/18/2014  . Pneumonia 09/18/2014  . Bronchitis, asthmatic 10/15/2014  . Asthma with acute exacerbation 11/26/2014  . Skin lesion of right lower extremity 03/13/2015   Past Medical History:  Diagnosis Date  . Anxiety   . Arthritis   . Hypertension      Review of Systems  Constitutional: Negative for fever.  HENT: Positive for congestion and postnasal drip. Negative for ear pain and sinus pressure.   Respiratory: Negative for cough, shortness of breath and wheezing.   Cardiovascular: Negative for chest pain.  Neurological: Positive for headaches.       Objective:   Physical Exam  Constitutional: He is oriented to person, place, and time. He appears well-developed and well-nourished.  HENT:  Head: Normocephalic and atraumatic.  Right Ear: Tympanic membrane and ear canal normal.  Left Ear: Tympanic membrane and ear canal normal.  Nose: Right sinus exhibits frontal sinus tenderness. Left sinus exhibits frontal sinus tenderness.  Mouth/Throat: Posterior oropharyngeal erythema present.  Cardiovascular: Normal rate, regular rhythm and normal heart sounds.  Pulmonary/Chest: Effort normal and breath sounds normal.  Lymphadenopathy:    He has no cervical adenopathy.  Neurological: He is alert and oriented to person, place, and time.       Assessment:     Marland KitchenMarland KitchenDiagnoses and all orders for this visit:  Moderate persistent asthma, unspecified whether complicated -     methylPREDNISolone sodium succinate (SOLU-MEDROL) 125  mg/2 mL injection 125 mg -     Ambulatory referral to Allergy  Nasal congestion -     methylPREDNISolone sodium succinate (SOLU-MEDROL) 125 mg/2 mL injection 125 mg -     Ambulatory referral to Allergy  Allergic sinusitis -     azithromycin (ZITHROMAX) 250 MG tablet; Take 2 tablets now and then one tablet for 4 days. -      Ambulatory referral to Allergy  Seasonal allergies -     Ambulatory referral to Allergy  Seasonal allergic rhinitis due to pollen -     Ambulatory referral to Allergy       Plan:     Discussed likely viral or allergic etiology. Encouraged use of Flonase and inhaler.solumedrol given IM today. Discussed signs of bacterial infection such as fever, productive cough, or worsening symptoms. Patient's wife was very concerned about his symptoms and his proness to worsen rapidly, so gave patient a prescription for azithromycin only to be filled if he worsens within the week and displays the signs of bacterial infection discussed.   Pt has hx of ongoing allergies worse in spring and fall. With concomitant asthma I am going to refer to allergy for testing to see if shots would be an option for patient.   Marland KitchenVernetta Honey PA-C, have reviewed and agree with the above documentation in it's entirety.

## 2018-06-13 NOTE — Patient Instructions (Addendum)
Call with any worsening symptoms.  Will refer to allergy.

## 2018-06-13 NOTE — Progress Notes (Deleted)
   Subjective:    Patient ID: Elijah Ford, male    DOB: [birth year], 43 y.o.   MRN: [id number]  HPI  Allergist. Pollen/grass  Review of Systems     Objective:   Physical Exam        Assessment & Plan:

## 2018-06-18 DIAGNOSIS — G4733 Obstructive sleep apnea (adult) (pediatric): Secondary | ICD-10-CM | POA: Diagnosis not present

## 2018-07-02 ENCOUNTER — Ambulatory Visit: Payer: Self-pay | Admitting: Pediatrics

## 2018-07-12 ENCOUNTER — Ambulatory Visit: Payer: BLUE CROSS/BLUE SHIELD | Admitting: Allergy and Immunology

## 2018-07-12 ENCOUNTER — Encounter: Payer: Self-pay | Admitting: Allergy and Immunology

## 2018-07-12 VITALS — BP 124/78 | HR 86 | Temp 98.6°F | Resp 16 | Ht 78.74 in | Wt 328.4 lb

## 2018-07-12 DIAGNOSIS — H1013 Acute atopic conjunctivitis, bilateral: Secondary | ICD-10-CM | POA: Diagnosis not present

## 2018-07-12 DIAGNOSIS — J4540 Moderate persistent asthma, uncomplicated: Secondary | ICD-10-CM | POA: Diagnosis not present

## 2018-07-12 DIAGNOSIS — T7819XA Other adverse food reactions, not elsewhere classified, initial encounter: Secondary | ICD-10-CM | POA: Insufficient documentation

## 2018-07-12 DIAGNOSIS — J45991 Cough variant asthma: Secondary | ICD-10-CM | POA: Diagnosis not present

## 2018-07-12 DIAGNOSIS — J4541 Moderate persistent asthma with (acute) exacerbation: Secondary | ICD-10-CM

## 2018-07-12 DIAGNOSIS — T781XXA Other adverse food reactions, not elsewhere classified, initial encounter: Secondary | ICD-10-CM | POA: Insufficient documentation

## 2018-07-12 DIAGNOSIS — T781XXD Other adverse food reactions, not elsewhere classified, subsequent encounter: Secondary | ICD-10-CM | POA: Diagnosis not present

## 2018-07-12 DIAGNOSIS — J3089 Other allergic rhinitis: Secondary | ICD-10-CM

## 2018-07-12 DIAGNOSIS — H1010 Acute atopic conjunctivitis, unspecified eye: Secondary | ICD-10-CM | POA: Insufficient documentation

## 2018-07-12 MED ORDER — MONTELUKAST SODIUM 10 MG PO TABS: 10.0000 mg | ORAL_TABLET | Freq: Every day | ORAL | 3 refills | Status: DC

## 2018-07-12 MED ORDER — ALBUTEROL SULFATE HFA 108 (90 BASE) MCG/ACT IN AERS: 2.0000 | INHALATION_SPRAY | RESPIRATORY_TRACT | 2 refills | Status: DC | PRN

## 2018-07-12 MED ORDER — LEVOCETIRIZINE DIHYDROCHLORIDE 5 MG PO TABS: 5.0000 mg | ORAL_TABLET | Freq: Every day | ORAL | 5 refills | Status: DC | PRN

## 2018-07-12 MED ORDER — FLUTICASONE PROPIONATE 93 MCG/ACT NA EXHU: 2.0000 | INHALANT_SUSPENSION | Freq: Two times a day (BID) | NASAL | 12 refills | Status: DC

## 2018-07-12 MED ORDER — FLUTICASONE PROPIONATE HFA 110 MCG/ACT IN AERO: 2.0000 | INHALATION_SPRAY | Freq: Two times a day (BID) | RESPIRATORY_TRACT | 5 refills | Status: DC

## 2018-07-12 MED ORDER — EPINEPHRINE 0.3 MG/0.3ML IJ SOAJ: INTRAMUSCULAR | 2 refills | Status: AC

## 2018-07-12 MED ORDER — OLOPATADINE HCL 0.7 % OP SOLN: 1.0000 [drp] | Freq: Every day | OPHTHALMIC | 5 refills | Status: DC | PRN

## 2018-07-12 MED FILL — PROAIR HFA 90 MCG INHALER: 108 (90 BAS | 17 days supply | Qty: 9 | Fill #0

## 2018-07-12 MED FILL — FLOVENT HFA 110 MCG INHALER: 110 | 30 days supply | Qty: 12 | Fill #0

## 2018-07-12 MED FILL — LEVOCETIRIZINE 5 MG TABLET: 5 | 30 days supply | Qty: 30 | Fill #0

## 2018-07-12 MED FILL — MONTELUKAST SOD 10 MG TAB: 10 | 90 days supply | Qty: 90 | Fill #0

## 2018-07-12 NOTE — Assessment & Plan Note (Signed)
   Treatment plan as outlined above for allergic rhinitis.  A prescription has been provided for Pazeo, one drop per eye daily as needed.  I have also recommended eye lubricant drops (i.e., Natural Tears) as needed. 

## 2018-07-12 NOTE — Assessment & Plan Note (Signed)
   Aeroallergen avoidance measures have been discussed and provided in written form.  A prescription has been provided for levocetirizine, 5 mg daily as needed.  A prescription has been provided for [facility], 2 actuations per nostril twice a day. Proper technique has been discussed and demonstrated.  Nasal saline spray (i.e., Simply Saline) or nasal saline lavage (i.e., NeilMed) is recommended as needed and prior to medicated nasal sprays.  The risks and benefits of aeroallergen immunotherapy have been discussed. The patient is interested in the possibility of initiating immunotherapy if insurance coverage is favorable. He will let us know how he would like to proceed.

## 2018-07-12 NOTE — Patient Instructions (Addendum)
Seasonal and perennial allergic rhinitis  Aeroallergen avoidance measures have been discussed and provided in written form.  A prescription has been provided for levocetirizine, 5 mg daily as needed.  A prescription has been provided for [facility], 2 actuations per nostril twice a day. Proper technique has been discussed and demonstrated.  Nasal saline spray (i.e., Simply Saline) or nasal saline lavage (i.e., NeilMed) is recommended as needed and prior to medicated nasal sprays.  The risks and benefits of aeroallergen immunotherapy have been discussed. The patient is interested in the possibility of initiating immunotherapy if insurance coverage is favorable. He will let us know how he would like to proceed.  Allergic conjunctivitis  Treatment plan as outlined above for allergic rhinitis.  A prescription has been provided for Pazeo, one drop per eye daily as needed.  I have also recommended eye lubricant drops (i.e., Natural Tears) as needed.  Oral allergy syndrome The patient's history and skin test results support a diagnosis of oral allergy syndrome (OAS). Peeling or cooking the food has shown to reduce symptoms and antihistamines may also relieve symptoms. Immunotherapy to the cross reacting pollens has improved or cured OAS in many patients, though this has not been consistent for all patients. Typically OAS is limited to itching or swelling of mucosal tissues from the lips to the back of the throat.   Information about OAS has been discussed and provided in written form.  All foods causing symptoms are to be avoided.  Should symptoms progress beyond the mouth and throat, epinephrine is to be administered and 911 is to be called immediately.  A prescription has been provided for epinephrine 0.3 mg autoinjector (Auvi-Q) 2 pack along with instructions for its proper administration.  Moderate persistent asthma  A prescription has been provided for Flovent (fluticasone) 110 g,  2  inhalations twice a day. To maximize pulmonary deposition, a spacer has been provided along with instructions for its proper administration with an HFA inhaler.  During respiratory tract infections or asthma flares, increase Flovent 110g to 3 inhalations 3 times per day until symptoms have returned to baseline.  Discontinue Brio Ellipta.  Continue montelukast 10 mg daily bedtime and albuterol HFA, 1 to 2 inhalations every 4-6 hours as needed and 15 minutes prior to vigorous exertion/exercise.  Subjective and objective measures of pulmonary function will be followed and the treatment plan will be adjusted accordingly.   Return in about 3 months (around 10/12/2018), or if symptoms worsen or fail to improve.  Control of Dust Mite Allergen  House dust mites play a major role in allergic asthma and rhinitis.  They occur in environments with high humidity wherever human skin, the food for dust mites is found. High levels have been detected in dust obtained from mattresses, pillows, carpets, upholstered furniture, bed covers, clothes and soft toys.  The principal allergen of the house dust mite is found in its feces.  A gram of dust may contain 1,000 mites and 250,000 fecal particles.  Mite antigen is easily measured in the air during house cleaning activities.    1. Encase mattresses, including the box spring, and pillow, in an air tight cover.  Seal the zipper end of the encased mattresses with wide adhesive tape. 2. Wash the bedding in water of 130 degrees Farenheit weekly.  Avoid cotton comforters/quilts and flannel bedding: the most ideal bed covering is the dacron comforter. 3. Remove all upholstered furniture from the bedroom. 4. Remove carpets, carpet padding, rugs, and non-washable window drapes from the  bedroom.  Wash drapes weekly or use plastic window coverings. 5. Remove all non-washable stuffed toys from the bedroom.  Wash stuffed toys weekly. 6. Have the room cleaned frequently with a  vacuum cleaner and a damp dust-mop.  The patient should not be in a room which is being cleaned and should wait 1 hour after cleaning before going into the room. 7. Close and seal all heating outlets in the bedroom.  Otherwise, the room will become filled with dust-laden air.  An electric heater can be used to heat the room. Reduce indoor humidity to less than 50%.  Do not use a humidifier.   Reducing Pollen Exposure  The American Academy of Allergy, Asthma and Immunology suggests the following steps to reduce your exposure to pollen during allergy seasons.    1. Do not hang sheets or clothing out to dry; pollen may collect on these items. 2. Do not mow lawns or spend time around freshly cut grass; mowing stirs up pollen. 3. Keep windows closed at night.  Keep car windows closed while driving. 4. Minimize morning activities outdoors, a time when pollen counts are usually at their highest. 5. Stay indoors as much as possible when pollen counts or humidity is high and on windy days when pollen tends to remain in the air longer. 6. Use air conditioning when possible.  Many air conditioners have filters that trap the pollen spores. 7. Use a HEPA room air filter to remove pollen form the indoor air you breathe.   Control of Mold Allergen  Mold and fungi can grow on a variety of surfaces provided certain temperature and moisture conditions exist.  Outdoor molds grow on plants, decaying vegetation and soil.  The major outdoor mold, Alternaria and Cladosporium, are found in very high numbers during hot and dry conditions.  Generally, a late Summer - Fall peak is seen for common outdoor fungal spores.  Rain will temporarily lower outdoor mold spore count, but counts rise rapidly when the rainy period ends.  The most important indoor molds are Aspergillus and Penicillium.  Dark, humid and poorly ventilated basements are ideal sites for mold growth.  The next most common sites of mold growth are the  bathroom and the kitchen.  Outdoor [employer] 1. Use air conditioning and keep windows closed 2. Avoid exposure to decaying vegetation. 3. Avoid leaf raking. 4. Avoid grain handling. 5. Consider wearing a face mask if working in moldy areas.  Indoor Mold Control 1. Maintain humidity below 50%. 2. Clean washable surfaces with 5% bleach solution. 3. Remove sources e.g. Contaminated carpets.    Oral Allergy Syndrome (OAS)  Oral Allergy Syndrome or OAS is an allergic reaction to certain (usually fresh) fruits, nuts, and vegetables. The allergy is not actually an allergy to food but a syndrome that develops in pollen allergy sufferers. The immune system mistakes the food proteins for the pollen proteins and causes an allergic reaction. For instance, an allergy to ragweed is associated with OAS reactions to banana, watermelon, cantaloupe, honeydew, zucchini, and cucumber. This does not mean that all sufferers of an allergy to ragweed will experience adverse effects from all or even any of these foods. Reactions may begin with one type of food and with reactions to others developing later. However, reaction to one or more foods in any given category does not necessarily mean a person is allergic to all foods in that group. OAS sufferers may have a number of reactions that usually occur very rapidly, within minutes  of eating a trigger food. The most common reaction is an itching or burning sensation in the lips, mouth, and/or pharynx. Sometimes other reactions can be triggered in the eyes, nose, and skin. The most severe reactions can result in asthma problems or anaphylaxis.  If a sufferer is able to swallow the food, there is a good chance that there will be a reaction later in the gastrointestinal tract. Vomiting, diarrhea, severe indigestion, or cramps may occur.  Treatment: An OAS sufferer should avoid foods to which they are allergic. Peeling or cooking the food has shown to reduce symptoms in  the throat and mouth, but may not relieve symptoms in the gastrointestinal tract. Antihistamines may also relieve the symptoms of the allergy. Persons with severe reactions may consider carrying injectable epinephrine should systemic symptoms occur. Allergy immunotherapy to the pollens has improved or cured OAS in many patients, though this has not been consistent for all patients. [facility] pollen: almonds, apples, celery, cherries, hazel nuts, peaches, pears, parsley, strawberry, raspberry . Birch pollen: almonds, apples, apricots, avocados, bananas, carrots, celery, cherries, chicory, coriander, fennel, fig, hazel nuts, kiwifruit, nectarines, parsley, parsnips, peaches, pears, peppers, plums, potatoes, prunes, soy, strawberries, wheat; Potential: walnuts . Grass pollen: fig, melons, tomatoes, oranges . Mugwort pollen : carrots, celery, coriander, fennel, parsley, peppers, sunflower . Ragweed pollen : banana, cantaloupe, cucumber, green pepper, paprika, sunflower seeds/oil, honeydew, watermelon, zucchini, echinacea, artichoke, dandelions, honey (if bees pollinate from wild flowers), hibiscus or chamomile tea . Possible cross-reactions (to any of the above): berries (strawberries, blueberries, raspberries, etc), citrus (oranges, lemons, etc), grapes, mango, figs, peanut, pineapple, pomegranates, watermelon

## 2018-07-12 NOTE — Assessment & Plan Note (Addendum)
The patient's history and skin test results support a diagnosis of oral allergy syndrome (OAS). Peeling or cooking the food has shown to reduce symptoms and antihistamines may also relieve symptoms. Immunotherapy to the cross reacting pollens has improved or cured OAS in many patients, though this has not been consistent for all patients. Typically OAS is limited to itching or swelling of mucosal tissues from the lips to the back of the throat.   Information about OAS has been discussed and provided in written form.  All foods causing symptoms are to be avoided.  Should symptoms progress beyond the mouth and throat, epinephrine is to be administered and 911 is to be called immediately.  A prescription has been provided for epinephrine 0.3 mg autoinjector (AuviQ) 2 pack along with instructions for its proper administration. 

## 2018-07-12 NOTE — Progress Notes (Signed)
New Patient Note  RE: Elijah Ford MRN: [id number] DOB: 1976/06/20 Date of Office Visit: 07/12/2018  Referring provider: Lavada Mesi Primary care provider: Donella Stade, PA-C  Chief Complaint: Allergic Rhinitis ; Asthma; and Food Intolerance   History of present illness: Elijah Ford is a 42 y.o. male seen today in consultation requested by Iran Planas, PA-C.  He reports that he has had symptoms consistent with allergic rhinitis and asthma since early childhood.  He experiences frequent nasal congestion, rhinorrhea, sneezing, postnasal drainage, nasal pruritus, ocular pruritus, and sinus pressure.  These symptoms occur year around but are more frequent and severe with pollen exposure.  He reports that he typically has several sinus infections and/or bronchitis per year requiring antibiotics and/or steroids.  He attempts to control his nasal symptoms with fluticasone nasal spray and montelukast daily.  His lower respiratory symptoms consist of coughing, dyspnea, chest tightness, and wheezing.  These lower respiratory symptoms are triggered by pollen exposure, upper respiratory tract infections, and exercise.  He is currently taking Brio Ellipta 100-25 g, 1 inhalation daily, and montelukast daily. He experiences oral pruritus with consumption of fresh melon, avocado, or raw carrots.  If the foods are cooked he does not experience the oral pruritus.  He does not experience concomitant urticaria, angioedema, or cardiopulmonary symptoms.  Assessment and plan: Seasonal and perennial allergic rhinitis  Aeroallergen avoidance measures have been discussed and provided in written form.  A prescription has been provided for levocetirizine, 5 mg daily as needed.  A prescription has been provided for [facility], 2 actuations per nostril twice a day. Proper technique has been discussed and demonstrated.  Nasal saline spray (i.e., Simply Saline) or nasal saline lavage (i.e., NeilMed) is  recommended as needed and prior to medicated nasal sprays.  The risks and benefits of aeroallergen immunotherapy have been discussed. The patient is interested in the possibility of initiating immunotherapy if insurance coverage is favorable. He will let us know how he would like to proceed.  Allergic conjunctivitis  Treatment plan as outlined above for allergic rhinitis.  A prescription has been provided for Pazeo, one drop per eye daily as needed.  I have also recommended eye lubricant drops (i.e., Natural Tears) as needed.  Oral allergy syndrome The patient's history and skin test results support a diagnosis of oral allergy syndrome (OAS). Peeling or cooking the food has shown to reduce symptoms and antihistamines may also relieve symptoms. Immunotherapy to the cross reacting pollens has improved or cured OAS in many patients, though this has not been consistent for all patients. Typically OAS is limited to itching or swelling of mucosal tissues from the lips to the back of the throat.   Information about OAS has been discussed and provided in written form.  All foods causing symptoms are to be avoided.  Should symptoms progress beyond the mouth and throat, epinephrine is to be administered and 911 is to be called immediately.  A prescription has been provided for epinephrine 0.3 mg autoinjector (Auvi-Q) 2 pack along with instructions for its proper administration.  Moderate persistent asthma  A prescription has been provided for Flovent (fluticasone) 110 g,  2 inhalations twice a day. To maximize pulmonary deposition, a spacer has been provided along with instructions for its proper administration with an HFA inhaler.  During respiratory tract infections or asthma flares, increase Flovent 110g to 3 inhalations 3 times per day until symptoms have returned to baseline.  Discontinue Brio Ellipta.  Continue montelukast 10  mg daily bedtime and albuterol HFA, 1 to 2 inhalations every 4-6  hours as needed and 15 minutes prior to vigorous exertion/exercise.  Subjective and objective measures of pulmonary function will be followed and the treatment plan will be adjusted accordingly.   Meds ordered this encounter  Medications  . EPINEPHrine (AUVI-Q) 0.3 mg/0.3 mL IJ SOAJ injection    Sig: Use as directed for severe allergic reaction.    Dispense:  2 Device    Refill:  2  . Fluticasone Propionate (XHANCE) 93 MCG/ACT EXHU    Sig: Place 2 sprays into the nose 2 (two) times daily.    Dispense:  32 mL    Refill:  12  . levocetirizine (XYZAL) 5 MG tablet    Sig: Take 1 tablet (5 mg total) by mouth daily as needed for allergies.    Dispense:  30 tablet    Refill:  5  . Olopatadine HCl (PAZEO) 0.7 % SOLN    Sig: Place 1 drop into both eyes daily as needed (for itchy eyes).    Dispense:  2.5 mL    Refill:  5  . fluticasone (FLOVENT HFA) 110 MCG/ACT inhaler    Sig: Inhale 2 puffs into the lungs 2 (two) times daily.    Dispense:  1 Inhaler    Refill:  5  . montelukast (SINGULAIR) 10 MG tablet    Sig: Take 1 tablet (10 mg total) by mouth at bedtime.    Dispense:  90 tablet    Refill:  3    90 day supply  . albuterol (PROAIR HFA) 108 (90 Base) MCG/ACT inhaler    Sig: Inhale 2 puffs into the lungs every 4 (four) hours as needed for wheezing or shortness of breath.    Dispense:  1 Inhaler    Refill:  2    Diagnostics: Spirometry: Spirometry reveals an FVC of 6.12 L (89% predicted) and an FEV1 of 3.90 L (73% predicted) and an FEV1 ratio of 81% without postbronchodilator improvement.  This study was performed while the patient was asymptomatic.  Please see scanned spirometry results for details. Epicutaneous testing: Robust reactivity to grass pollen, weed pollen, ragweed pollen, tree pollen, and dust mite antigen. Intradermal testing: Positive to molds. Food allergen skin testing: Positive to cantaloupe and watermelon.  Physical examination: Blood pressure 124/78, pulse 86,  temperature 98.6 F (37 C), temperature source Oral, resp. rate 16, height 6' 6.74" (2 m), weight (!) 328 lb 6.4 oz (149 kg), SpO2 96 %.  General: Alert, interactive, in no acute distress. HEENT: TMs pearly gray, turbinates edematous with thick discharge, post-pharynx erythematous. Neck: Supple without lymphadenopathy. Lungs: Clear to auscultation without wheezing, rhonchi or rales. CV: Normal S1, S2 without murmurs. Abdomen: Nondistended, nontender. Skin: Warm and dry, without lesions or rashes. Extremities:  No clubbing, cyanosis or edema. Neuro:   Grossly intact.  Review of systems:  Review of systems negative except as noted in HPI / PMHx or noted below: Review of Systems  Constitutional: Negative.   HENT: Negative.   Eyes: Negative.   Respiratory: Negative.   Cardiovascular: Negative.   Gastrointestinal: Negative.   Genitourinary: Negative.   Musculoskeletal: Negative.   Skin: Negative.   Neurological: Negative.   Endo/Heme/Allergies: Negative.   Psychiatric/Behavioral: Negative.     Past medical history:  Past Medical History:  Diagnosis Date  . Anxiety   . Arthritis    both feet, soreness in multiple areas, plantar fascitis, uses orthodics in both shoes   . Asthma   .  Depression    counselling in place, sporadically   . GERD (gastroesophageal reflux disease)   . Hyperlipidemia   . Hypertension    was in the past   . Pneumonia 2015  . Seasonal allergies   . Sleep apnea    CPAP q night     Past surgical history:  Past Surgical History:  Procedure Laterality Date  . EYE SURGERY     lasik- both   . INGUINAL HERNIA REPAIR Left 07/21/2016   Procedure: REPAIR LEFT INGUINAL HERNIA;  Surgeon: Erroll Luna, MD;  Location: [place];  Service: General;  Laterality: Left;  . INSERTION OF MESH Left 07/21/2016   Procedure: INSERTION OF MESH;  Surgeon: Erroll Luna, MD;  Location: [place];  Service: General;  Laterality: Left;  . [place]  .  TONSILECTOMY/ADENOIDECTOMY WITH MYRINGOTOMY     no myringotomy   . uvula removal     multiple surgeries for sinuses due to sleep apnea  . VASECTOMY    . VENTRAL HERNIA REPAIR N/A 07/21/2016   Procedure: REPAIR  VENTRAL  HERNIA X 2;  Surgeon: Erroll Luna, MD;  Location: [facility];  Service: General;  Laterality: N/A;    Family history: Family History  Problem Relation Age of Onset  . Depression Mother   . Cancer Father   . Alcohol abuse Brother   . Depression Brother   . Alcohol abuse Brother   . Depression Brother   . Allergic rhinitis Neg Hx   . Angioedema Neg Hx   . Asthma Neg Hx   . Eczema Neg Hx   . Immunodeficiency Neg Hx   . Urticaria Neg Hx     Social history: Social History   Socioeconomic History  . Marital status: Married    Spouse name: Not on file  . Number of children: Not on file  . Years of education: Not on file  . Highest education level: Not on file  Occupational History  . Not on file  Social Needs  . Financial resource strain: Not on file  . Food insecurity:    Worry: Not on file    Inability: Not on file  . Transportation needs:    Medical: Not on file    Non-medical: Not on file  Tobacco Use  . Smoking status: Never Smoker  . Smokeless tobacco: Never Used  Substance and Sexual Activity  . Alcohol use: No  . Drug use: No  . Sexual activity: Yes  Lifestyle  . Physical activity:    Days per week: Not on file    Minutes per session: Not on file  . Stress: Not on file  Relationships  . Social connections:    Talks on phone: Not on file    Gets together: Not on file    Attends religious service: Not on file    Active member of club or organization: Not on file    Attends meetings of clubs or organizations: Not on file    Relationship status: Not on file  . Intimate partner violence:    Fear of current or ex partner: Not on file    Emotionally abused: Not on file    Physically abused: Not on file    Forced sexual activity: Not on file    Other Topics Concern  . Not on file  Social History Narrative  . Not on file   Environmental History: The patient lives in a house built in 1993 with carpeting the bedroom and  central air/heat.  There is no known mold/water damage in the home.  There is a cockatiel in the home which does not have access to his bedroom.  He is a non-smoker.  Allergies as of 07/12/2018      Reactions   Other Itching   MELONS      Medication List        Accurate as of 07/12/18  9:42 PM. Always use your most recent med list.          albuterol 108 (90 Base) MCG/ACT inhaler Commonly known as:  PROVENTIL HFA;VENTOLIN HFA Inhale 2 puffs into the lungs every 4 (four) hours as needed for wheezing or shortness of breath.   allopurinol 300 MG tablet Commonly known as:  ZYLOPRIM TAKE 1 TABLET (300 MG TOTAL) BY MOUTH DAILY.   AMBULATORY NON FORMULARY MEDICATION Continuous positive airway pressure (CPAP) machine set at 11 cm of H2O pressure, with all supplemental supplies as needed.   buPROPion 300 MG 24 hr tablet Commonly known as:  WELLBUTRIN XL TAKE 1 TABLET (300 MG TOTAL) BY MOUTH DAILY   EPINEPHrine 0.3 mg/0.3 mL Soaj injection Commonly known as:  EPI-PEN Use as directed for severe allergic reaction.   FLUoxetine 40 MG capsule Commonly known as:  PROZAC Take 1 capsule (40 mg total) by mouth daily.   fluticasone 110 MCG/ACT inhaler Commonly known as:  FLOVENT HFA Inhale 2 puffs into the lungs 2 (two) times daily.   fluticasone 50 MCG/ACT nasal spray Commonly known as:  FLONASE Place 2 sprays into both nostrils daily.   Fluticasone Propionate 93 MCG/ACT Exhu Place 2 sprays into the nose 2 (two) times daily.   fluticasone furoate-vilanterol 100-25 MCG/INH Aepb Commonly known as:  BREO ELLIPTA Inhale 1 puff into the lungs daily.   levocetirizine 5 MG tablet Commonly known as:  XYZAL Take 1 tablet (5 mg total) by mouth daily as needed for allergies.   loratadine 10 MG  tablet Commonly known as:  CLARITIN Take 10 mg by mouth daily.   montelukast 10 MG tablet Commonly known as:  SINGULAIR Take 1 tablet (10 mg total) by mouth at bedtime.   Olopatadine HCl 0.7 % Soln Place 1 drop into both eyes daily as needed (for itchy eyes).   omeprazole 40 MG capsule Commonly known as:  PRILOSEC Take 1 capsule (40 mg total) by mouth daily.   testosterone cypionate 200 MG/ML injection Commonly known as:  DEPOTESTOSTERONE CYPIONATE Inject 1 mL (200 mg total) into the muscle every 14 (fourteen) days. Please include syringes and 22G 1.5" needles       Known medication allergies: Allergies  Allergen Reactions  . Other Itching    MELONS    I appreciate the opportunity to take part in Yordin's care. Please do not hesitate to contact me with questions.  Sincerely,   R. Edgar Frisk, MD

## 2018-07-12 NOTE — Assessment & Plan Note (Signed)
   A prescription has been provided for Flovent (fluticasone) 110 g, 2 inhalations twice a day. To maximize pulmonary deposition, a spacer has been provided along with instructions for its proper administration with an HFA inhaler.  During respiratory tract infections or asthma flares, increase Flovent 110g to 3 inhalations 3 times per day until symptoms have returned to baseline.  Discontinue Brio Ellipta.  Continue montelukast 10 mg daily bedtime and albuterol HFA, 1 to 2 inhalations every 4-6 hours as needed and 15 minutes prior to vigorous exertion/exercise.  Subjective and objective measures of pulmonary function will be followed and the treatment plan will be adjusted accordingly.

## 2018-07-16 DIAGNOSIS — J3089 Other allergic rhinitis: Secondary | ICD-10-CM

## 2018-07-16 NOTE — Progress Notes (Signed)
VIALS EXP 07-17-19

## 2018-07-17 DIAGNOSIS — J301 Allergic rhinitis due to pollen: Secondary | ICD-10-CM

## 2018-07-19 DIAGNOSIS — G4733 Obstructive sleep apnea (adult) (pediatric): Secondary | ICD-10-CM | POA: Diagnosis not present

## 2018-07-23 ENCOUNTER — Encounter: Payer: Self-pay | Admitting: Physician Assistant

## 2018-07-30 ENCOUNTER — Other Ambulatory Visit: Payer: Self-pay | Admitting: Allergy

## 2018-07-30 ENCOUNTER — Other Ambulatory Visit: Payer: Self-pay | Admitting: Physician Assistant

## 2018-07-30 MED FILL — OMEPRAZOLE 40 MG CPDR: 40 | 90 days supply | Qty: 90 | Fill #0

## 2018-07-31 MED FILL — ALLOPURINOL 300 MG TABLET: 300 | 90 days supply | Qty: 90 | Fill #0

## 2018-08-01 ENCOUNTER — Ambulatory Visit: Payer: BLUE CROSS/BLUE SHIELD

## 2018-08-06 ENCOUNTER — Ambulatory Visit (INDEPENDENT_AMBULATORY_CARE_PROVIDER_SITE_OTHER): Payer: BLUE CROSS/BLUE SHIELD

## 2018-08-06 DIAGNOSIS — J3089 Other allergic rhinitis: Secondary | ICD-10-CM

## 2018-08-09 ENCOUNTER — Ambulatory Visit (INDEPENDENT_AMBULATORY_CARE_PROVIDER_SITE_OTHER): Payer: BLUE CROSS/BLUE SHIELD | Admitting: *Deleted

## 2018-08-09 DIAGNOSIS — J309 Allergic rhinitis, unspecified: Secondary | ICD-10-CM

## 2018-08-15 ENCOUNTER — Ambulatory Visit (INDEPENDENT_AMBULATORY_CARE_PROVIDER_SITE_OTHER): Payer: BLUE CROSS/BLUE SHIELD

## 2018-08-15 DIAGNOSIS — J309 Allergic rhinitis, unspecified: Secondary | ICD-10-CM | POA: Diagnosis not present

## 2018-08-18 DIAGNOSIS — G4733 Obstructive sleep apnea (adult) (pediatric): Secondary | ICD-10-CM | POA: Diagnosis not present

## 2018-08-20 ENCOUNTER — Ambulatory Visit (INDEPENDENT_AMBULATORY_CARE_PROVIDER_SITE_OTHER): Payer: BLUE CROSS/BLUE SHIELD | Admitting: *Deleted

## 2018-08-20 DIAGNOSIS — J309 Allergic rhinitis, unspecified: Secondary | ICD-10-CM | POA: Diagnosis not present

## 2018-08-27 ENCOUNTER — Ambulatory Visit (INDEPENDENT_AMBULATORY_CARE_PROVIDER_SITE_OTHER): Payer: BLUE CROSS/BLUE SHIELD

## 2018-08-27 DIAGNOSIS — J309 Allergic rhinitis, unspecified: Secondary | ICD-10-CM

## 2018-08-31 ENCOUNTER — Other Ambulatory Visit: Payer: Self-pay | Admitting: Physician Assistant

## 2018-08-31 ENCOUNTER — Ambulatory Visit (INDEPENDENT_AMBULATORY_CARE_PROVIDER_SITE_OTHER): Payer: BLUE CROSS/BLUE SHIELD

## 2018-08-31 DIAGNOSIS — J309 Allergic rhinitis, unspecified: Secondary | ICD-10-CM

## 2018-08-31 DIAGNOSIS — F331 Major depressive disorder, recurrent, moderate: Secondary | ICD-10-CM

## 2018-08-31 MED ORDER — FLUOXETINE HCL 40 MG PO CAPS: 40.0000 mg | ORAL_CAPSULE | Freq: Every day | ORAL | 1 refills | Status: DC

## 2018-08-31 MED ORDER — OMEPRAZOLE 40 MG PO CPDR: 40.0000 mg | DELAYED_RELEASE_CAPSULE | Freq: Every day | ORAL | 4 refills | Status: DC

## 2018-08-31 MED ORDER — BUPROPION HCL ER (XL) 300 MG PO TB24: ORAL_TABLET | ORAL | 1 refills | Status: DC

## 2018-08-31 MED ORDER — ALLOPURINOL 300 MG PO TABS: 300.0000 mg | ORAL_TABLET | Freq: Every day | ORAL | 3 refills | Status: DC

## 2018-08-31 NOTE — Telephone Encounter (Signed)
Refills sent

## 2018-08-31 NOTE — Telephone Encounter (Signed)
PT needs refill on all medications,that can go to [facility]. PT is going to be out of Prozac in a week.

## 2018-09-05 ENCOUNTER — Ambulatory Visit (INDEPENDENT_AMBULATORY_CARE_PROVIDER_SITE_OTHER): Payer: BLUE CROSS/BLUE SHIELD | Admitting: *Deleted

## 2018-09-05 DIAGNOSIS — J309 Allergic rhinitis, unspecified: Secondary | ICD-10-CM

## 2018-09-18 DIAGNOSIS — G4733 Obstructive sleep apnea (adult) (pediatric): Secondary | ICD-10-CM | POA: Diagnosis not present

## 2018-09-21 ENCOUNTER — Ambulatory Visit (INDEPENDENT_AMBULATORY_CARE_PROVIDER_SITE_OTHER): Payer: BLUE CROSS/BLUE SHIELD

## 2018-09-21 DIAGNOSIS — J309 Allergic rhinitis, unspecified: Secondary | ICD-10-CM

## 2018-09-25 ENCOUNTER — Ambulatory Visit (INDEPENDENT_AMBULATORY_CARE_PROVIDER_SITE_OTHER): Payer: BLUE CROSS/BLUE SHIELD | Admitting: *Deleted

## 2018-09-25 DIAGNOSIS — J309 Allergic rhinitis, unspecified: Secondary | ICD-10-CM | POA: Diagnosis not present

## 2018-10-01 ENCOUNTER — Ambulatory Visit (INDEPENDENT_AMBULATORY_CARE_PROVIDER_SITE_OTHER): Payer: BLUE CROSS/BLUE SHIELD

## 2018-10-01 DIAGNOSIS — J309 Allergic rhinitis, unspecified: Secondary | ICD-10-CM

## 2018-10-03 ENCOUNTER — Encounter: Payer: Self-pay | Admitting: Physician Assistant

## 2018-10-04 MED ORDER — TESTOSTERONE CYPIONATE 200 MG/ML IM SOLN: 200.0000 mg | INTRAMUSCULAR | 0 refills | Status: DC

## 2018-10-05 ENCOUNTER — Ambulatory Visit (INDEPENDENT_AMBULATORY_CARE_PROVIDER_SITE_OTHER): Payer: BLUE CROSS/BLUE SHIELD | Admitting: *Deleted

## 2018-10-05 DIAGNOSIS — J309 Allergic rhinitis, unspecified: Secondary | ICD-10-CM

## 2018-10-11 ENCOUNTER — Encounter: Payer: Self-pay | Admitting: Allergy and Immunology

## 2018-10-11 ENCOUNTER — Ambulatory Visit (INDEPENDENT_AMBULATORY_CARE_PROVIDER_SITE_OTHER): Payer: BLUE CROSS/BLUE SHIELD | Admitting: Allergy and Immunology

## 2018-10-11 VITALS — BP 104/76 | HR 75 | Temp 97.8°F | Resp 16

## 2018-10-11 DIAGNOSIS — J4540 Moderate persistent asthma, uncomplicated: Secondary | ICD-10-CM | POA: Diagnosis not present

## 2018-10-11 DIAGNOSIS — J3089 Other allergic rhinitis: Secondary | ICD-10-CM

## 2018-10-11 MED ORDER — FLUTICASONE PROPIONATE HFA 110 MCG/ACT IN AERO: 2.0000 | INHALATION_SPRAY | Freq: Two times a day (BID) | RESPIRATORY_TRACT | 5 refills | Status: DC

## 2018-10-11 NOTE — Assessment & Plan Note (Signed)
Currently well controlled.  For now, continue Flovent 110 g, 2 inhalations via spacer device twice daily, montelukast 10 mg daily at bedtime, and albuterol HFA, 1 to 2 inhalations every 4-6 hours as needed.  If subjective and objective measures of pulmonary function remain stable, we will consider stepping down therapy on the next visit.

## 2018-10-11 NOTE — Assessment & Plan Note (Signed)
Stable.  Continue appropriate allergen avoidance measures, aeroallergen immunotherapy injections as prescribed, montelukast daily, levocetirizine as needed, Xhance as needed, and nasal saline irrigation as needed.  Medications will be decreased or discontinued as symptom relief from immunotherapy becomes evident.

## 2018-10-11 NOTE — Progress Notes (Signed)
Follow-up Note  RE: Elijah Ford MRN: [id number] DOB: Nov 01, 1975 Date of Office Visit: 10/11/2018  Primary care provider: Lavada Mesi Referring provider: Donella Stade, PA-C  History of present illness: Elijah Ford is a 43 y.o. male with allergic rhinoconjunctivitis, oral allergy syndrome, and persistent asthma presenting today for follow-up.  He was previously seen in this clinic for his initial evaluation on July 12, 2018.  He started immunotherapy and has been receiving buildup injections without problems or complications.  He has no specific nasal symptom complaints today.  His asthma has been well controlled with Flovent 110 g, 2 inhalations via spacer device twice daily, and montelukast 10 mg daily at bedtime.  He has not required asthma rescue medication, experienced nocturnal awakenings due to lower respiratory symptoms, nor have activities of daily living been limited.  Assessment and plan: Moderate persistent asthma Currently well controlled.  For now, continue Flovent 110 g, 2 inhalations via spacer device twice daily, montelukast 10 mg daily at bedtime, and albuterol HFA, 1 to 2 inhalations every 4-6 hours as needed.  If subjective and objective measures of pulmonary function remain stable, we will consider stepping down therapy on the next visit.  Seasonal and perennial allergic rhinitis Stable.  Continue appropriate allergen avoidance measures, aeroallergen immunotherapy injections as prescribed, montelukast daily, levocetirizine as needed, Xhance as needed, and nasal saline irrigation as needed.  Medications will be decreased or discontinued as symptom relief from immunotherapy becomes evident.   Meds ordered this encounter  Medications  . fluticasone (FLOVENT HFA) 110 MCG/ACT inhaler    Sig: Inhale 2 puffs into the lungs 2 (two) times daily.    Dispense:  1 Inhaler    Refill:  5    Diagnostics: Spirometry reveals an FVC of 6.07 L and an FEV1  of 4.12 L (77% predicted) with an FEV1 ratio of 86%.  FEV1 is improved compared with previous study.  Please see scanned spirometry results for details.    Physical examination: Blood pressure 104/76, pulse 75, temperature 97.8 F (36.6 C), temperature source Oral, resp. rate 16, SpO2 95 %.  General: Alert, interactive, in no acute distress. HEENT: TMs pearly gray, turbinates mildly edematous without discharge, post-pharynx mildly erythematous. Neck: Supple without lymphadenopathy. Lungs: Clear to auscultation without wheezing, rhonchi or rales. CV: Normal S1, S2 without murmurs. Skin: Warm and dry, without lesions or rashes.  The following portions of the patient's history were reviewed and updated as appropriate: allergies, current medications, past family history, past medical history, past social history, past surgical history and problem list.  Allergies as of 10/11/2018      Reactions   Other Itching   MELONS      Medication List       Accurate as of October 11, 2018 12:03 PM. Always use your most recent med list.        albuterol 108 (90 Base) MCG/ACT inhaler Commonly known as:  PROAIR HFA Inhale 2 puffs into the lungs every 4 (four) hours as needed for wheezing or shortness of breath.   allopurinol 300 MG tablet Commonly known as:  ZYLOPRIM Take 1 tablet (300 mg total) by mouth daily.   AMBULATORY NON FORMULARY MEDICATION Continuous positive airway pressure (CPAP) machine set at 11 cm of H2O pressure, with all supplemental supplies as needed.   buPROPion 300 MG 24 hr tablet Commonly known as:  WELLBUTRIN XL TAKE 1 TABLET (300 MG TOTAL) BY MOUTH DAILY   EPINEPHrine 0.3 mg/0.3 mL Soaj  injection Commonly known as:  AUVI-Q Use as directed for severe allergic reaction.   FLUoxetine 40 MG capsule Commonly known as:  PROZAC Take 1 capsule (40 mg total) by mouth daily.   fluticasone 110 MCG/ACT inhaler Commonly known as:  FLOVENT HFA Inhale 2 puffs into the lungs  2 (two) times daily.   fluticasone 50 MCG/ACT nasal spray Commonly known as:  FLONASE Place 2 sprays into both nostrils daily.   Fluticasone Propionate 93 MCG/ACT Exhu Commonly known as:  XHANCE Place 2 sprays into the nose 2 (two) times daily.   fluticasone furoate-vilanterol 100-25 MCG/INH Aepb Commonly known as:  BREO ELLIPTA Inhale 1 puff into the lungs daily.   levocetirizine 5 MG tablet Commonly known as:  XYZAL Take 1 tablet (5 mg total) by mouth daily as needed for allergies.   montelukast 10 MG tablet Commonly known as:  SINGULAIR Take 1 tablet (10 mg total) by mouth at bedtime.   Olopatadine HCl 0.7 % Soln Commonly known as:  PAZEO Place 1 drop into both eyes daily as needed (for itchy eyes).   omeprazole 40 MG capsule Commonly known as:  PRILOSEC Take 1 capsule (40 mg total) by mouth daily.   testosterone cypionate 200 MG/ML injection Commonly known as:  DEPOTESTOSTERONE CYPIONATE Inject 1 mL (200 mg total) into the muscle every 14 (fourteen) days. Please include syringes and 22G 1.5" needles       Allergies  Allergen Reactions  . Other Itching    MELONS    I appreciate the opportunity to take part in Aveer's care. Please do not hesitate to contact me with questions.  Sincerely,   R. Edgar Frisk, MD

## 2018-10-11 NOTE — Patient Instructions (Addendum)
Moderate persistent asthma Currently well controlled.  For now, continue Flovent 110 g, 2 inhalations via spacer device twice daily, montelukast 10 mg daily at bedtime, and albuterol HFA, 1 to 2 inhalations every 4-6 hours as needed.  If subjective and objective measures of pulmonary function remain stable, we will consider stepping down therapy on the next visit.  Seasonal and perennial allergic rhinitis Stable.  Continue appropriate allergen avoidance measures, aeroallergen immunotherapy injections as prescribed, montelukast daily, levocetirizine as needed, Xhance as needed, and nasal saline irrigation as needed.  Medications will be decreased or discontinued as symptom relief from immunotherapy becomes evident.   Return in about 5 months (around 03/12/2019), or if symptoms worsen or fail to improve.

## 2018-10-15 ENCOUNTER — Ambulatory Visit (INDEPENDENT_AMBULATORY_CARE_PROVIDER_SITE_OTHER): Payer: BLUE CROSS/BLUE SHIELD

## 2018-10-15 DIAGNOSIS — J309 Allergic rhinitis, unspecified: Secondary | ICD-10-CM

## 2018-10-19 DIAGNOSIS — G4733 Obstructive sleep apnea (adult) (pediatric): Secondary | ICD-10-CM | POA: Diagnosis not present

## 2018-10-24 ENCOUNTER — Ambulatory Visit: Payer: BLUE CROSS/BLUE SHIELD | Admitting: Physician Assistant

## 2018-10-26 ENCOUNTER — Other Ambulatory Visit: Payer: Self-pay | Admitting: Physician Assistant

## 2018-10-26 MED ORDER — PREDNISONE 50 MG PO TABS: ORAL_TABLET | ORAL | 0 refills | Status: DC

## 2018-10-26 NOTE — Progress Notes (Signed)
In hospital with daughter in ICU. Started wheezing. Prednisone sent. Use albuterol. Keep up date. Hx of asthma.

## 2018-10-29 ENCOUNTER — Ambulatory Visit (INDEPENDENT_AMBULATORY_CARE_PROVIDER_SITE_OTHER): Payer: BLUE CROSS/BLUE SHIELD | Admitting: Physician Assistant

## 2018-10-29 ENCOUNTER — Encounter: Payer: Self-pay | Admitting: Physician Assistant

## 2018-10-29 ENCOUNTER — Ambulatory Visit (INDEPENDENT_AMBULATORY_CARE_PROVIDER_SITE_OTHER): Payer: BLUE CROSS/BLUE SHIELD

## 2018-10-29 VITALS — BP 148/86 | HR 89 | Temp 98.4°F | Wt 331.0 lb

## 2018-10-29 DIAGNOSIS — Z6379 Other stressful life events affecting family and household: Secondary | ICD-10-CM

## 2018-10-29 DIAGNOSIS — R05 Cough: Secondary | ICD-10-CM

## 2018-10-29 DIAGNOSIS — J3089 Other allergic rhinitis: Secondary | ICD-10-CM

## 2018-10-29 DIAGNOSIS — J0140 Acute pansinusitis, unspecified: Secondary | ICD-10-CM | POA: Diagnosis not present

## 2018-10-29 DIAGNOSIS — J4541 Moderate persistent asthma with (acute) exacerbation: Secondary | ICD-10-CM | POA: Diagnosis not present

## 2018-10-29 DIAGNOSIS — R059 Cough, unspecified: Secondary | ICD-10-CM

## 2018-10-29 MED ORDER — PREDNISONE 20 MG PO TABS: ORAL_TABLET | ORAL | 0 refills | Status: DC

## 2018-10-29 MED ORDER — BENZONATATE 200 MG PO CAPS: 200.0000 mg | ORAL_CAPSULE | Freq: Two times a day (BID) | ORAL | 0 refills | Status: DC | PRN

## 2018-10-29 MED ORDER — LEVOFLOXACIN 500 MG PO TABS: 500.0000 mg | ORAL_TABLET | Freq: Every day | ORAL | 0 refills | Status: DC

## 2018-10-29 MED ORDER — CEFTRIAXONE SODIUM 500 MG IJ SOLR
1000.0000 mg | Freq: Once | INTRAMUSCULAR | Status: AC
  Administered 2018-10-29: 1000 mg via INTRAMUSCULAR

## 2018-10-29 MED ORDER — METHYLPREDNISOLONE SODIUM SUCC 125 MG IJ SOLR
125.0000 mg | Freq: Once | INTRAMUSCULAR | Status: AC
  Administered 2018-10-29: 125 mg via INTRAMUSCULAR

## 2018-10-29 NOTE — Patient Instructions (Signed)
Will get cXR today.  Start levaquin.  Start new prednisone pack.  Tessalon pearls as needed.  Increase nebulizer to every 2-4 hours.  Make sure sleeping and hydrating.  Can continue mucinex.

## 2018-10-29 NOTE — Progress Notes (Addendum)
Subjective:    Patient ID: Elijah Ford, male    DOB: [birth year], 43 y.o.   MRN: [id number]  HPI  Pt is a 43 yo male with asthma and multiple allergies who presents to the clinic with coughing productively for almost 2 weeks with SOB and chest tightness. His daughter recently OD on wellbutrin and in ICU in critical care. He has been spending a lot of time at the hospital. He continues flovent daily. No fever, chills. He started zpak that he had in closet when first started. I called in prednisone that he also started. He continues to worsen. He is doing duoneb twice a day.   .. Active Ambulatory Problems    Diagnosis Date Noted  . Essential hypertension, benign 11/04/2013  . Anxiety state 11/04/2013  . Depression 11/04/2013  . Non morbid obesity, unspecified obesity type 11/04/2013  . GERD (gastroesophageal reflux disease) 11/04/2013  . Tarsometatarsal osteoarthritis of both feet 06/20/2014  . Bilateral extensor hallucis longus tendinitis 06/27/2014  . Metatarsal stress fracture of left foot 07/08/2014  . Prediabetes 07/09/2014  . Gout 07/09/2014  . Moderate persistent asthma 11/26/2014  . Eosinophilia 06/26/2015  . Male hypogonadism 07/06/2015  . Left ear pain 12/02/2015  . Plantar fasciitis, left 03/09/2016  . Elevated hematocrit 04/06/2016  . Ventral hernia without obstruction or gangrene 06/01/2016  . Plantar fascial fibromatosis of left foot 12/14/2016  . Bilateral metatarsal stress injury 05/09/2017  . Obstructive sleep apnea 07/17/2014  . Hyperlipidemia 07/18/2017  . Vitamin D deficiency 07/18/2017  . Elevated serum creatinine 07/18/2017  . Low serum vitamin B12 07/18/2017  . Seasonal allergies 06/13/2018  . ED (erectile dysfunction) 04/10/2015  . Epididymal cyst 04/10/2015  . Incomplete emptying of bladder 04/10/2015  . S/P UPPP (uvulopalatopharyngoplasty) 07/17/2014  . Seasonal and perennial allergic rhinitis 07/12/2018  . Allergic conjunctivitis 07/12/2018  .  Oral allergy syndrome 07/12/2018  . Stressful life event affecting family 10/30/2018   Resolved Ambulatory Problems    Diagnosis Date Noted  . Tinea pedis 06/27/2014  . Acute bronchitis 06/27/2014  . CAP (community acquired pneumonia) 09/18/2014  . Pneumonia 09/18/2014  . Bronchitis, asthmatic 10/15/2014  . Asthma with acute exacerbation 11/26/2014  . Skin lesion of right lower extremity 03/13/2015   Past Medical History:  Diagnosis Date  . Anxiety   . Arthritis   . Asthma   . Hypertension   . Sleep apnea      Review of Systems See HPI.     Objective:   Physical Exam Vitals signs reviewed.  Constitutional:      Appearance: Normal appearance.  HENT:     Head: Normocephalic and atraumatic.     Right Ear: Tympanic membrane and ear canal normal.     Left Ear: Tympanic membrane and ear canal normal.     Nose: Congestion present.     Mouth/Throat:     Mouth: Mucous membranes are moist.     Pharynx: Posterior oropharyngeal erythema present. No oropharyngeal exudate.  Eyes:     Pupils: Pupils are equal, round, and reactive to light.  Cardiovascular:     Rate and Rhythm: Normal rate and regular rhythm.     Pulses: Normal pulses.     Heart sounds: Normal heart sounds.  Pulmonary:     Breath sounds: Wheezing and rhonchi present.     Comments: difficult to take deep breath due to coughing more.  Scattered rhonchi/wheezing bilateral lungs.  Neurological:     General: No focal deficit present.  Mental Status: He is alert and oriented to person, place, and time.  Psychiatric:     Comments: Flat affect today.            Assessment & Plan:  .Marland KitchenKenly was seen today for cough.  Diagnoses and all orders for this visit:  Acute non-recurrent pansinusitis -     levofloxacin (LEVAQUIN) 500 MG tablet; Take 1 tablet (500 mg total) by mouth daily. -     predniSONE (DELTASONE) 20 MG tablet; Take 3 tablets for 3 days, take 2 tablets for 3 days, take 1 tablet for 3 days, and  then 1/2 tablet for 4 days. -     methylPREDNISolone sodium succinate (SOLU-MEDROL) 125 mg/2 mL injection 125 mg -     cefTRIAXone (ROCEPHIN) injection 1,000 mg  Cough -     DG Chest 2 View -     benzonatate (TESSALON) 200 MG capsule; Take 1 capsule (200 mg total) by mouth 2 (two) times daily as needed for cough. -     methylPREDNISolone sodium succinate (SOLU-MEDROL) 125 mg/2 mL injection 125 mg -     cefTRIAXone (ROCEPHIN) injection 1,000 mg  Seasonal and perennial allergic rhinitis -     predniSONE (DELTASONE) 20 MG tablet; Take 3 tablets for 3 days, take 2 tablets for 3 days, take 1 tablet for 3 days, and then 1/2 tablet for 4 days. -     benzonatate (TESSALON) 200 MG capsule; Take 1 capsule (200 mg total) by mouth 2 (two) times daily as needed for cough.  Moderate persistent asthma with acute exacerbation -     predniSONE (DELTASONE) 20 MG tablet; Take 3 tablets for 3 days, take 2 tablets for 3 days, take 1 tablet for 3 days, and then 1/2 tablet for 4 days. -     benzonatate (TESSALON) 200 MG capsule; Take 1 capsule (200 mg total) by mouth 2 (two) times daily as needed for cough. -     methylPREDNISolone sodium succinate (SOLU-MEDROL) 125 mg/2 mL injection 125 mg  Moderate persistent asthmatic bronchitis with acute exacerbation -     predniSONE (DELTASONE) 20 MG tablet; Take 3 tablets for 3 days, take 2 tablets for 3 days, take 1 tablet for 3 days, and then 1/2 tablet for 4 days. -     benzonatate (TESSALON) 200 MG capsule; Take 1 capsule (200 mg total) by mouth 2 (two) times daily as needed for cough. -     methylPREDNISolone sodium succinate (SOLU-MEDROL) 125 mg/2 mL injection 125 mg  Stressful life event affecting family   CXR confirmed no pneumonia.  Rocephin and solumedrol given IM today.  Longer steriod pack to start.  Start levaquin orally.  Increased duoneb to every 2-4 hours.  Tessalon pearls during the day.  Stay on all preventative medications.  Make sure getting  rest or you will stay run down.  Follow up as needed.   Pt obviously is under a lot of stress. He is unable to work. He is spending all his time at hospital.

## 2018-10-30 DIAGNOSIS — Z6379 Other stressful life events affecting family and household: Secondary | ICD-10-CM | POA: Insufficient documentation

## 2018-10-31 ENCOUNTER — Other Ambulatory Visit: Payer: Self-pay | Admitting: Physician Assistant

## 2018-10-31 MED ORDER — IPRATROPIUM-ALBUTEROL 0.5-2.5 (3) MG/3ML IN SOLN: 3.0000 mL | RESPIRATORY_TRACT | 1 refills | Status: DC | PRN

## 2018-11-02 ENCOUNTER — Ambulatory Visit (INDEPENDENT_AMBULATORY_CARE_PROVIDER_SITE_OTHER): Payer: BLUE CROSS/BLUE SHIELD | Admitting: Physician Assistant

## 2018-11-02 ENCOUNTER — Encounter: Payer: Self-pay | Admitting: Physician Assistant

## 2018-11-02 ENCOUNTER — Ambulatory Visit (INDEPENDENT_AMBULATORY_CARE_PROVIDER_SITE_OTHER): Payer: BLUE CROSS/BLUE SHIELD

## 2018-11-02 ENCOUNTER — Telehealth: Payer: Self-pay | Admitting: Physician Assistant

## 2018-11-02 VITALS — BP 140/91 | HR 97 | Temp 97.9°F | Ht 78.74 in | Wt 331.0 lb

## 2018-11-02 DIAGNOSIS — Z6379 Other stressful life events affecting family and household: Secondary | ICD-10-CM

## 2018-11-02 DIAGNOSIS — R05 Cough: Secondary | ICD-10-CM

## 2018-11-02 DIAGNOSIS — F430 Acute stress reaction: Secondary | ICD-10-CM | POA: Diagnosis not present

## 2018-11-02 DIAGNOSIS — R059 Cough, unspecified: Secondary | ICD-10-CM

## 2018-11-02 DIAGNOSIS — F331 Major depressive disorder, recurrent, moderate: Secondary | ICD-10-CM | POA: Diagnosis not present

## 2018-11-02 DIAGNOSIS — J329 Chronic sinusitis, unspecified: Secondary | ICD-10-CM

## 2018-11-02 DIAGNOSIS — R0602 Shortness of breath: Secondary | ICD-10-CM | POA: Diagnosis not present

## 2018-11-02 DIAGNOSIS — J40 Bronchitis, not specified as acute or chronic: Secondary | ICD-10-CM

## 2018-11-02 DIAGNOSIS — Z111 Encounter for screening for respiratory tuberculosis: Secondary | ICD-10-CM | POA: Diagnosis not present

## 2018-11-02 DIAGNOSIS — F411 Generalized anxiety disorder: Secondary | ICD-10-CM

## 2018-11-02 MED ORDER — FLUTICASONE PROPIONATE HFA 110 MCG/ACT IN AERO: 2.0000 | INHALATION_SPRAY | Freq: Two times a day (BID) | RESPIRATORY_TRACT | 12 refills | Status: DC

## 2018-11-02 MED ORDER — HYDROCODONE-ACETAMINOPHEN 10-325 MG PO TABS: 1.0000 | ORAL_TABLET | ORAL | 0 refills | Status: DC | PRN

## 2018-11-02 NOTE — Telephone Encounter (Signed)
TB screen test ordered. Pt's daughter is in the ICU and they are testing her for TB based on a spot seen on her lung imaging.

## 2018-11-02 NOTE — Telephone Encounter (Signed)
Chest xray ordered also, Pt still coughing.

## 2018-11-02 NOTE — Telephone Encounter (Signed)
Agree with plan 

## 2018-11-02 NOTE — Progress Notes (Signed)
Subjective:    Patient ID: Elijah Ford, male    DOB: 1975-12-27, 43 y.o.   MRN: [id number]  HPI  Pt is a 43 yo male with hx of seasonal and environmental allergies and asthma with ongoing sinobronchitis and acute stress reaction.   Pt has been treated with zpak, levaquin, prednisone, flovent, duonebs, Xhance for last 10 days. He continues to cough and per patient run a low grade fever. CXR was clear 5 days ago.   Pt's daughter is in ICU after suspected overdose on wellbutrin. He is spending most of his days and nights with her. He is emotionally and physical overwhelmed. He is having trouble sleeping. He is on wellbutrin and prozac himself for ongoing depression. He was previously controlled. Currently he is all consumed with the prognosis of his daughter.   He needs papers filled out for work.   .. Active Ambulatory Problems    Diagnosis Date Noted  . Essential hypertension, benign 11/04/2013  . Anxiety state 11/04/2013  . Depression 11/04/2013  . Non morbid obesity, unspecified obesity type 11/04/2013  . GERD (gastroesophageal reflux disease) 11/04/2013  . Tarsometatarsal osteoarthritis of both feet 06/20/2014  . Bilateral extensor hallucis longus tendinitis 06/27/2014  . Metatarsal stress fracture of left foot 07/08/2014  . Prediabetes 07/09/2014  . Gout 07/09/2014  . Moderate persistent asthma 11/26/2014  . Eosinophilia 06/26/2015  . Male hypogonadism 07/06/2015  . Left ear pain 12/02/2015  . Plantar fasciitis, left 03/09/2016  . Elevated hematocrit 04/06/2016  . Ventral hernia without obstruction or gangrene 06/01/2016  . Plantar fascial fibromatosis of left foot 12/14/2016  . Bilateral metatarsal stress injury 05/09/2017  . Obstructive sleep apnea 07/17/2014  . Hyperlipidemia 07/18/2017  . Vitamin D deficiency 07/18/2017  . Elevated serum creatinine 07/18/2017  . Low serum vitamin B12 07/18/2017  . Seasonal allergies 06/13/2018  . ED (erectile dysfunction)  04/10/2015  . Epididymal cyst 04/10/2015  . Incomplete emptying of bladder 04/10/2015  . S/P UPPP (uvulopalatopharyngoplasty) 07/17/2014  . Seasonal and perennial allergic rhinitis 07/12/2018  . Allergic conjunctivitis 07/12/2018  . Oral allergy syndrome 07/12/2018  . Stressful life event affecting family 10/30/2018   Resolved Ambulatory Problems    Diagnosis Date Noted  . Tinea pedis 06/27/2014  . Acute bronchitis 06/27/2014  . CAP (community acquired pneumonia) 09/18/2014  . Pneumonia 09/18/2014  . Bronchitis, asthmatic 10/15/2014  . Asthma with acute exacerbation 11/26/2014  . Skin lesion of right lower extremity 03/13/2015   Past Medical History:  Diagnosis Date  . Anxiety   . Arthritis   . Asthma   . Hypertension   . Sleep apnea       Review of Systems    see HPI>  Objective:   Physical Exam Vitals signs reviewed.  Constitutional:      Appearance: He is obese.  HENT:     Head: Normocephalic and atraumatic.     Right Ear: Tympanic membrane normal.     Left Ear: Tympanic membrane normal.     Nose: Congestion present.     Mouth/Throat:     Pharynx: Posterior oropharyngeal erythema present.  Cardiovascular:     Rate and Rhythm: Normal rate.  Pulmonary:     Comments: Extreme rhonchi throughout both lungs. Productive cough on exam.  Lymphadenopathy:     Cervical: No cervical adenopathy.  Neurological:     General: No focal deficit present.     Mental Status: He is alert and oriented to person, place, and time.  Psychiatric:  Comments: Flat affect           Assessment & Plan:  .Marland KitchenAasir was seen today for follow-up.  Diagnoses and all orders for this visit:  Acute stress reaction  Stress due to illness of family member  Sinobronchitis -     HYDROcodone-acetaminophen (NORCO) 10-325 MG tablet; Take 1 tablet by mouth every 4 (four) hours as needed.  Moderate episode of recurrent major depressive disorder (HCC)  Anxiety state  Other orders -      fluticasone (FLOVENT HFA) 110 MCG/ACT inhaler; Inhale 2 puffs into the lungs 2 (two) times daily.   At the time pt is emotionally and physically overwhelmed. I believe his extreme stress is preventing him from healing physically. Continue on prozac and wellbutrin but no other medication changes made today. Discussed rest and times of self care. He does have a very supportive wife and church family.   Repeat chest xray showed bronchitits signs as well as some atelectasis. Finish levaquin and prednisone. Continue maintance asthma medication. Continue duonebs as needed. Added hydrocodone for cough at this point. Pt would like to see his daughter but cough is keeping him out of ICU. Keep deep breathing.   Pt is written out of work for next 3 weeks due to emotional and physical illness. Paperwork filled out today.   Marland Kitchen.Spent 30 minutes with patient and greater than 50 percent of visit spent counseling patient regarding treatment plan.

## 2018-11-04 LAB — QUANTIFERON-TB GOLD PLUS
Mitogen-NIL: >10 IU/mL
NIL: 0.02 IU/mL
QuantiFERON-TB Gold Plus: NEGATIVE
TB1-NIL: 0 IU/mL
TB2-NIL: 0.01 IU/mL

## 2018-11-04 NOTE — Telephone Encounter (Signed)
Discussed with patient

## 2018-11-05 ENCOUNTER — Encounter: Payer: Self-pay | Admitting: Physician Assistant

## 2018-11-17 DIAGNOSIS — G4733 Obstructive sleep apnea (adult) (pediatric): Secondary | ICD-10-CM | POA: Diagnosis not present

## 2018-12-03 ENCOUNTER — Ambulatory Visit: Payer: BLUE CROSS/BLUE SHIELD | Admitting: Physician Assistant

## 2018-12-05 ENCOUNTER — Encounter: Payer: Self-pay | Admitting: Physician Assistant

## 2018-12-06 MED ORDER — FLUTICASONE PROPIONATE HFA 110 MCG/ACT IN AERO: 2.0000 | INHALATION_SPRAY | Freq: Two times a day (BID) | RESPIRATORY_TRACT | 12 refills | Status: DC

## 2018-12-06 MED ORDER — ALBUTEROL SULFATE 108 (90 BASE) MCG/ACT IN AEPB: 2.0000 | INHALATION_SPRAY | RESPIRATORY_TRACT | 3 refills | Status: DC | PRN

## 2018-12-06 MED FILL — PROAIR RESPICLICK INHAL PWD: 108 (90 BAS | 17 days supply | Qty: 1 | Fill #0

## 2018-12-07 ENCOUNTER — Encounter: Payer: Self-pay | Admitting: Physician Assistant

## 2018-12-07 ENCOUNTER — Other Ambulatory Visit: Payer: Self-pay

## 2018-12-07 ENCOUNTER — Ambulatory Visit (INDEPENDENT_AMBULATORY_CARE_PROVIDER_SITE_OTHER): Payer: BLUE CROSS/BLUE SHIELD | Admitting: Physician Assistant

## 2018-12-07 VITALS — BP 114/83 | HR 79 | Temp 98.2°F | Ht 78.7 in | Wt 330.0 lb

## 2018-12-07 DIAGNOSIS — Z6379 Other stressful life events affecting family and household: Secondary | ICD-10-CM

## 2018-12-07 DIAGNOSIS — F411 Generalized anxiety disorder: Secondary | ICD-10-CM | POA: Diagnosis not present

## 2018-12-07 DIAGNOSIS — F331 Major depressive disorder, recurrent, moderate: Secondary | ICD-10-CM | POA: Diagnosis not present

## 2018-12-07 DIAGNOSIS — F430 Acute stress reaction: Secondary | ICD-10-CM | POA: Diagnosis not present

## 2018-12-07 NOTE — Progress Notes (Signed)
Subjective:    Patient ID: Elijah Ford, male    DOB: 1975-09-24, 43 y.o.   MRN: [id number]  HPI  Pt is a 43 yo male with hx of MDD and GAD who presents to the clinic to follow up after being written out for 6 weeks due to his daughter attempting suicide and in ICU. His daughter is now home and he is her primary caregiver. His daughter is improving and they are adjusting to a new normal. His daughter still needs to be accompanied to the rest room and close monitoring. She is suffering many neurological deficits that they are trying to figure out. He remains anxious and stressed and needs to be at home with his daughter. His goal is to go back to work in 6 weeks.    .. Active Ambulatory Problems    Diagnosis Date Noted  . Essential hypertension, benign 11/04/2013  . Anxiety state 11/04/2013  . Depression 11/04/2013  . Non morbid obesity, unspecified obesity type 11/04/2013  . GERD (gastroesophageal reflux disease) 11/04/2013  . Tarsometatarsal osteoarthritis of both feet 06/20/2014  . Bilateral extensor hallucis longus tendinitis 06/27/2014  . Metatarsal stress fracture of left foot 07/08/2014  . Prediabetes 07/09/2014  . Gout 07/09/2014  . Moderate persistent asthma 11/26/2014  . Eosinophilia 06/26/2015  . Male hypogonadism 07/06/2015  . Left ear pain 12/02/2015  . Plantar fasciitis, left 03/09/2016  . Elevated hematocrit 04/06/2016  . Ventral hernia without obstruction or gangrene 06/01/2016  . Plantar fascial fibromatosis of left foot 12/14/2016  . Bilateral metatarsal stress injury 05/09/2017  . Obstructive sleep apnea 07/17/2014  . Hyperlipidemia 07/18/2017  . Vitamin D deficiency 07/18/2017  . Elevated serum creatinine 07/18/2017  . Low serum vitamin B12 07/18/2017  . Seasonal allergies 06/13/2018  . ED (erectile dysfunction) 04/10/2015  . Epididymal cyst 04/10/2015  . Incomplete emptying of bladder 04/10/2015  . S/P UPPP (uvulopalatopharyngoplasty) 07/17/2014  .  Seasonal and perennial allergic rhinitis 07/12/2018  . Allergic conjunctivitis 07/12/2018  . Oral allergy syndrome 07/12/2018  . Stressful life event affecting family 10/30/2018  . Stress due to illness of family member 12/07/2018  . Acute stress reaction 12/07/2018   Resolved Ambulatory Problems    Diagnosis Date Noted  . Tinea pedis 06/27/2014  . Acute bronchitis 06/27/2014  . CAP (community acquired pneumonia) 09/18/2014  . Pneumonia 09/18/2014  . Bronchitis, asthmatic 10/15/2014  . Asthma with acute exacerbation 11/26/2014  . Skin lesion of right lower extremity 03/13/2015   Past Medical History:  Diagnosis Date  . Anxiety   . Arthritis   . Asthma   . Hypertension   . Sleep apnea       Review of Systems    see HPI.  Objective:   Physical Exam Vitals signs reviewed.  Constitutional:      Appearance: Normal appearance. He is obese.  HENT:     Head: Normocephalic.  Cardiovascular:     Rate and Rhythm: Normal rate and regular rhythm.     Pulses: Normal pulses.  Pulmonary:     Effort: Pulmonary effort is normal.     Breath sounds: Normal breath sounds.  Neurological:     General: No focal deficit present.     Mental Status: He is alert and oriented to person, place, and time.  Psychiatric:        Mood and Affect: Mood normal.        Behavior: Behavior normal.           Assessment &  Plan:  ..Diagnoses and all orders for this visit:  Acute stress reaction  Anxiety state  Moderate episode of recurrent major depressive disorder ([place])  Stress due to illness of family member   .Marland Kitchen Depression screen [facility] 2/9 12/07/2018 05/29/2018 07/18/2017 05/05/2017 12/19/2013  Decreased Interest 1 0 0 3 1  Down, Depressed, Hopeless 1 0 0 3 2  PHQ - 2 Score 2 0 0 6 3  Altered sleeping 1 0 - 3 1  Tired, decreased energy 2 2 - 3 1  Change in appetite 1 2 - 3 0  Feeling bad or failure about yourself  0 0 - 3 3  Trouble concentrating 0 0 - 3 3  Moving slowly or  fidgety/restless 0 0 - 2 2  Suicidal thoughts 0 0 - 0 0  PHQ-9 Score 6 4 - 23 13  Difficult doing work/chores Somewhat difficult Not difficult at all - - -   .Marland Kitchen GAD 7 : Generalized Anxiety Score 12/07/2018 05/29/2018  Nervous, Anxious, on Edge 2 1  Control/stop worrying 2 0  Worry too much - different things 2 1  Trouble relaxing 2 1  Restless 2 0  Easily annoyed or irritable 2 0  Afraid - awful might happen 2 0  Total GAD 7 Score 14 3  Anxiety Difficulty Very difficult Not difficult at all    He is improving because his daughter is a miracle and at home. They are still trying to adjust. He currently is not mentally stable to go back to work. I will fill out paperwork for 6 more weeks to total 3 months before he returns to work.

## 2018-12-10 NOTE — Telephone Encounter (Signed)
[facility] scheduled.

## 2018-12-18 DIAGNOSIS — G4733 Obstructive sleep apnea (adult) (pediatric): Secondary | ICD-10-CM | POA: Diagnosis not present

## 2019-01-01 ENCOUNTER — Telehealth: Payer: Self-pay | Admitting: Neurology

## 2019-01-01 NOTE — Telephone Encounter (Signed)
Called and LMOM with Ashur Sawa with [employer] at [phone] ext [phone] asking them to fax [place] updated disability forms for completion and to call with any questions.

## 2019-01-04 NOTE — Telephone Encounter (Signed)
FMLA form filled out, faxed, emailed and sent to scan.

## 2019-01-17 DIAGNOSIS — G4733 Obstructive sleep apnea (adult) (pediatric): Secondary | ICD-10-CM | POA: Diagnosis not present

## 2019-01-21 ENCOUNTER — Encounter: Payer: Self-pay | Admitting: Physician Assistant

## 2019-01-21 MED ORDER — ALPRAZOLAM 0.5 MG PO TABS: 0.5000 mg | ORAL_TABLET | Freq: Every evening | ORAL | 0 refills | Status: DC | PRN

## 2019-01-21 MED ORDER — FLUOXETINE HCL 20 MG PO TABS: 60.0000 mg | ORAL_TABLET | Freq: Every day | ORAL | 1 refills | Status: DC

## 2019-01-21 MED FILL — ALPRAZolam 0.5 MG TABS: 0.5 | 30 days supply | Qty: 30 | Fill #0

## 2019-01-21 MED FILL — FLUOXETINE HCL 20 MG TABS: 20 | 30 days supply | Qty: 90 | Fill #0

## 2019-02-21 MED FILL — FLUOXETINE HCL 20 MG TABS: 20 | 30 days supply | Qty: 90 | Fill #1

## 2019-03-12 ENCOUNTER — Other Ambulatory Visit: Payer: Self-pay | Admitting: Physician Assistant

## 2019-03-12 DIAGNOSIS — F331 Major depressive disorder, recurrent, moderate: Secondary | ICD-10-CM

## 2019-03-14 ENCOUNTER — Ambulatory Visit: Payer: BLUE CROSS/BLUE SHIELD | Admitting: Allergy and Immunology

## 2019-03-20 ENCOUNTER — Encounter: Payer: Self-pay | Admitting: Physician Assistant

## 2019-03-20 ENCOUNTER — Other Ambulatory Visit: Payer: Self-pay | Admitting: Physician Assistant

## 2019-03-20 MED ORDER — FLUOXETINE HCL 20 MG PO TABS: 80.0000 mg | ORAL_TABLET | Freq: Every day | ORAL | 1 refills | Status: DC

## 2019-03-20 MED FILL — FLUOXETINE HCL 20 MG TABS: 20 | 30 days supply | Qty: 90 | Fill #0

## 2019-03-27 ENCOUNTER — Encounter: Payer: Self-pay | Admitting: Physician Assistant

## 2019-03-27 ENCOUNTER — Ambulatory Visit (INDEPENDENT_AMBULATORY_CARE_PROVIDER_SITE_OTHER): Payer: BC Managed Care – PPO | Admitting: Physician Assistant

## 2019-03-27 ENCOUNTER — Other Ambulatory Visit: Payer: Self-pay

## 2019-03-27 VITALS — Temp 98.7°F | Wt 340.0 lb

## 2019-03-27 DIAGNOSIS — R05 Cough: Secondary | ICD-10-CM | POA: Diagnosis not present

## 2019-03-27 DIAGNOSIS — R058 Other specified cough: Secondary | ICD-10-CM

## 2019-03-27 DIAGNOSIS — J019 Acute sinusitis, unspecified: Secondary | ICD-10-CM | POA: Diagnosis not present

## 2019-03-27 DIAGNOSIS — J4540 Moderate persistent asthma, uncomplicated: Secondary | ICD-10-CM

## 2019-03-27 MED ORDER — AMOXICILLIN-POT CLAVULANATE 875-125 MG PO TABS: 1.0000 | ORAL_TABLET | Freq: Two times a day (BID) | ORAL | 0 refills | Status: DC

## 2019-03-27 MED ORDER — BUDESONIDE-FORMOTEROL FUMARATE 160-4.5 MCG/ACT IN AERO: 2.0000 | INHALATION_SPRAY | Freq: Two times a day (BID) | RESPIRATORY_TRACT | 0 refills | Status: DC

## 2019-03-27 MED FILL — AMOX-CLAV 875-125 MG TABLET: 875-125 | 10 days supply | Qty: 20 | Fill #0

## 2019-03-27 MED FILL — SYMBICORT 160-4.5 MCG INH: 160-4.5 | 30 days supply | Qty: 10 | Fill #0

## 2019-03-27 NOTE — Progress Notes (Signed)
Virtual Visit via Video Note  I connected with Elijah Ford on 03/27/19 at  8:10 AM EDT by a video enabled telemedicine application and verified that I am speaking with the correct person using two identifiers.   I discussed the limitations of evaluation and management by telemedicine and the availability of in person appointments. The patient expressed understanding and agreed to proceed.  History of Present Illness: HPI:                                                                Elijah Ford is a 43 y.o. male   CC: sinus pressure,cough  Patient with PMH of allergic rhinitis, asthma, OSA, GERD, HTN reports he has had "sinus issues" for about a week, gradually worsening. He then developed a cough a few days ago. Cough is occ productive of clear mucus. Endorses sinus pressure and headache. Denies fever, malaise, myalgia, sore throat, SOB, wheezing. Denies known exposure to COVID-19 or sick contacts. Denies recent travel.      Past Medical History:  Diagnosis Date  . Anxiety   . Arthritis    both feet, soreness in multiple areas, plantar fascitis, uses orthodics in both shoes   . Asthma   . Depression    counselling in place, sporadically   . GERD (gastroesophageal reflux disease)   . Hyperlipidemia   . Hypertension    was in the past   . Pneumonia 2015  . Seasonal allergies   . Sleep apnea    CPAP q night    Past Surgical History:  Procedure Laterality Date  . EYE SURGERY     lasik- both   . INGUINAL HERNIA REPAIR Left 07/21/2016   Procedure: REPAIR LEFT INGUINAL HERNIA;  Surgeon: Erroll Luna, MD;  Location[facility];  Service: General;  Laterality: Left;  . INSERTION OF MESH Left 07/21/2016   Procedure: INSERTION OF MESH;  Surgeon: Erroll Luna, MD;  Location: [facility];  Service: General;  Laterality: Left;  . [place]  . TONSILECTOMY/ADENOIDECTOMY WITH MYRINGOTOMY     no myringotomy   . uvula removal     multiple surgeries for sinuses due  to sleep apnea  . VASECTOMY    . VENTRAL HERNIA REPAIR N/A 07/21/2016   Procedure: REPAIR  VENTRAL  HERNIA X 2;  Surgeon: Erroll Luna, MD;  Location: [facility];  Service: General;  Laterality: N/A;   Social History   Tobacco Use  . Smoking status: Never Smoker  . Smokeless tobacco: Never Used  Substance Use Topics  . Alcohol use: No   family history includes Alcohol abuse in his brother and brother; Cancer in his father; Depression in his brother, brother, and mother.    ROS: negative except as noted in the HPI  Medications: Current Outpatient Medications  Medication Sig Dispense Refill  . Albuterol Sulfate (PROAIR RESPICLICK) 327 (90 Base) MCG/ACT AEPB Inhale 2 puffs into the lungs every 4 (four) hours as needed. 1 each 3  . allopurinol (ZYLOPRIM) 300 MG tablet Take 1 tablet (300 mg total) by mouth daily. 90 tablet 3  . ALPRAZolam (XANAX) 0.5 MG tablet Take 1 tablet (0.5 mg total) by mouth at bedtime as needed for anxiety. 30 tablet 0  . AMBULATORY NON FORMULARY MEDICATION  Continuous positive airway pressure (CPAP) machine set at 11 cm of H2O pressure, with all supplemental supplies as needed. 1 each 0  . buPROPion (WELLBUTRIN XL) 300 MG 24 hr tablet TAKE 1 TABLET DAILY 90 tablet 1  . EPINEPHrine (AUVI-Q) 0.3 mg/0.3 mL IJ SOAJ injection Use as directed for severe allergic reaction. 2 Device 2  . FLUoxetine (PROZAC) 20 MG tablet Take 4 tablets (80 mg total) by mouth daily. 360 tablet 1  . fluticasone (FLONASE) 50 MCG/ACT nasal spray Place 2 sprays into both nostrils daily. 16 g 5  . Fluticasone Propionate (XHANCE) 93 MCG/ACT EXHU Place 2 sprays into the nose 2 (two) times daily. 32 mL 12  . ipratropium-albuterol (DUONEB) 0.5-2.5 (3) MG/3ML SOLN Take 3 mLs by nebulization every 4 (four) hours as needed. 360 mL 1  . levocetirizine (XYZAL) 5 MG tablet Take 1 tablet (5 mg total) by mouth daily as needed for allergies. 30 tablet 5  . montelukast (SINGULAIR) 10 MG tablet Take 1 tablet (10 mg  total) by mouth at bedtime. 90 tablet 3  . Olopatadine HCl (PAZEO) 0.7 % SOLN Place 1 drop into both eyes daily as needed (for itchy eyes). 2.5 mL 5  . omeprazole (PRILOSEC) 40 MG capsule Take 1 capsule (40 mg total) by mouth daily. 90 capsule 4  . testosterone cypionate (DEPOTESTOSTERONE CYPIONATE) 200 MG/ML injection Inject 1 mL (200 mg total) into the muscle every 14 (fourteen) days. Please include syringes and 22G 1.5" needles 5 mL 0  . amoxicillin-clavulanate (AUGMENTIN) 875-125 MG tablet Take 1 tablet by mouth 2 (two) times daily. 20 tablet 0  . budesonide-formoterol (SYMBICORT) 160-4.5 MCG/ACT inhaler Inhale 2 puffs into the lungs 2 (two) times daily. 1 Inhaler 0   No current facility-administered medications for this visit.    Allergies  Allergen Reactions  . Other Itching    MELONS       Objective:  Temp 98.7 F (37.1 C) (Oral)   Wt (!) 340 lb (154.2 kg)   BMI 38.60 kg/m  Gen:  alert, not ill-appearing, no distress, appropriate for age 11: head normocephalic without obvious abnormality, conjunctiva and cornea clear, trachea midline Pulm: Normal work of breathing, normal phonation Neuro: alert and oriented x 3 Psych: cooperative, speech is articulate, normal rate and volume; thought processes clear and goal-directed, normal judgment, good insight   No results found for this or any previous visit (from the past 72 hour(s)). No results found.    Assessment and Plan: 43 y.o. male with   .[name] was seen today for cough.  Diagnoses and all orders for this visit:  Acute sinusitis, recurrence not specified, unspecified location -     amoxicillin-clavulanate (AUGMENTIN) 875-125 MG tablet; Take 1 tablet by mouth 2 (two) times daily.  Moderate persistent asthma without complication -     budesonide-formoterol (SYMBICORT) 160-4.5 MCG/ACT inhaler; Inhale 2 puffs into the lungs 2 (two) times daily.  Productive cough  Patient unable to provide BP and HR. He is afebrile  and well appearing on limited physical exam by video  Patient with symptoms suggestive of sinusitis Cannot rule out COVID-19 infection, but absence of fever, headache, malaise, and SOB makes this lower on the differential Offered COVID-19 testing and patient deferred Counseled to quarantine and self-monitor at home as a precaution until he has been without respiratory symptoms for 3 days and at least 10 days since symptoms started Counseled to contact us for worsening symptoms and recommend he undergo testing should he develop fever or  worsening lower respiratory symptoms  He does not currently have a controller inhaler due to cost. Starting Symbicort and send $0 co-pay card via MyChart He is not presently having symptoms of acute asthma exacerbation   Follow Up Instructions:    I discussed the assessment and treatment plan with the patient. The patient was provided an opportunity to ask questions and all were answered. The patient agreed with the plan and demonstrated an understanding of the instructions.   The patient was advised to call back or seek an in-person evaluation if the symptoms worsen or if the condition fails to improve as anticipated.  I provided 15 minutes of non-face-to-face time during this encounter.   Trixie Dredge, [place]

## 2019-03-28 ENCOUNTER — Other Ambulatory Visit: Payer: BC Managed Care – PPO

## 2019-03-28 ENCOUNTER — Telehealth: Payer: Self-pay | Admitting: *Deleted

## 2019-03-28 ENCOUNTER — Encounter: Payer: Self-pay | Admitting: Physician Assistant

## 2019-03-28 ENCOUNTER — Encounter (INDEPENDENT_AMBULATORY_CARE_PROVIDER_SITE_OTHER): Payer: Self-pay

## 2019-03-28 DIAGNOSIS — Z20822 Contact with and (suspected) exposure to covid-19: Secondary | ICD-10-CM

## 2019-03-28 DIAGNOSIS — R6889 Other general symptoms and signs: Secondary | ICD-10-CM | POA: Diagnosis not present

## 2019-03-28 NOTE — Telephone Encounter (Signed)
Contacted pt due to [facility] response 03/28/2019 of worsening appetite and weakness; the pt says that he has body aches, forcing himself to eat; his temp was 99.4; recommendations:  *Muscle aches, headache, and other pains: Often this comes and goes with the fever. Take acetaminophen every 4-6 hours (Adults 650 mg)  read all the instructions on the package. * Drink more fluids, at least 8-10 cups daily. One cup equals 8 oz (240 ml).  * WATER: water is often the best liquid to drink. You should also eat some salty foods (e.g., potato chips, pretzels, saltine crackers). This is important to make sure you are getting enough salt, sugars, and fluids to meet your body's needs.  * SPORTS DRINKS: You can also drink a sports drinks (e.g., Gatorade, Powerade) to help treat and prevent dehydration. For it to work best, mix it half and half with water.; pt also advised to prioritize and space activities; pt also advised to contact PCP for further recommendations; her verbalized understanding.

## 2019-03-28 NOTE — Telephone Encounter (Signed)
-----   Message from [facility], [place] sent at 03/28/2019  9:29 AM EDT ----- Please schedule patient for COVID-19 testing

## 2019-03-28 NOTE — Progress Notes (Signed)
Patient called clinic today stating he now feels feverish and would like COVID-19 testing Routed message to [facility] COVID-19 self monitoring program ordered and work note provided via [employer]

## 2019-03-28 NOTE — Telephone Encounter (Signed)
Pt notified and scheduled for today at [facility] at 11:30 for covid-19 testing. Advised that this is a drive thru testing site and to wear a mask, stay in car with window rolled up until ready for testing. He voiced understanding.

## 2019-03-28 NOTE — Addendum Note (Signed)
Addended by: Nelson Chimes E on: 03/28/2019 09:29 AM   Modules accepted: Orders

## 2019-03-30 ENCOUNTER — Encounter (INDEPENDENT_AMBULATORY_CARE_PROVIDER_SITE_OTHER): Payer: Self-pay

## 2019-04-01 LAB — NOVEL CORONAVIRUS, NAA: SARS-CoV-2, NAA: NOT DETECTED

## 2019-04-01 NOTE — Telephone Encounter (Signed)
Left message on machine for patient to call back.

## 2019-04-01 NOTE — Telephone Encounter (Signed)
Can we call and see how patient is doing?

## 2019-04-02 NOTE — Telephone Encounter (Signed)
Mychart message sent to patient.

## 2019-04-04 NOTE — Telephone Encounter (Signed)
Jade - FYI ?

## 2019-04-08 ENCOUNTER — Encounter: Payer: Self-pay | Admitting: Physician Assistant

## 2019-04-08 ENCOUNTER — Telehealth (INDEPENDENT_AMBULATORY_CARE_PROVIDER_SITE_OTHER): Payer: BC Managed Care – PPO | Admitting: Physician Assistant

## 2019-04-08 ENCOUNTER — Telehealth: Payer: BC Managed Care – PPO | Admitting: Physician Assistant

## 2019-04-08 VITALS — Temp 98.4°F | Ht >= 80 in | Wt 330.0 lb

## 2019-04-08 DIAGNOSIS — F419 Anxiety disorder, unspecified: Secondary | ICD-10-CM | POA: Diagnosis not present

## 2019-04-08 DIAGNOSIS — F430 Acute stress reaction: Secondary | ICD-10-CM | POA: Diagnosis not present

## 2019-04-08 DIAGNOSIS — F331 Major depressive disorder, recurrent, moderate: Secondary | ICD-10-CM

## 2019-04-08 MED ORDER — BUSPIRONE HCL 5 MG PO TABS: 5.0000 mg | ORAL_TABLET | Freq: Three times a day (TID) | ORAL | 1 refills | Status: DC

## 2019-04-08 MED FILL — busPIRone HCL 5 MG TABS: 5 | 30 days supply | Qty: 90 | Fill #0

## 2019-04-08 NOTE — Progress Notes (Signed)
Patient still struggling with mood. PHQ9-GAD7 completed.

## 2019-04-08 NOTE — Progress Notes (Signed)
Patient ID: JAMORION GOMILLION, male   DOB: 1976-02-18, 43 y.o.   MRN: [id number] .Marland KitchenVirtual Visit via Video Note  I connected with [name] on 04/08/19 at 11:30 AM EDT by a video enabled telemedicine application and verified that I am speaking with the correct person using two identifiers.  Location: Patient: home Provider: home   I discussed the limitations of evaluation and management by telemedicine and the availability of in person appointments. The patient expressed understanding and agreed to proceed.  History of Present Illness: Pt is a 43 yo male with HTN, MDD, anxiety who calls in today to discuss worsening stress and anxiety.   He is accompanied by wife who helps as historian.   Earlier this year his daughter attempted suicide and he was written out of work to be with her for 3 months. She survived and at home now.  Since he has returned to [employer] his regional manager has "been out for him". He does not understand why. He can't do anything right. He has not been written up. He talks to him with degrading tone and disapproval. The last most recent thing he did was attempt to get a store manager to say the reason she was taking another job was because of him. He is anxious, depressed and feels like he cannot do anything right.  He has been on wellbutrin and prozac for years and done well. prozac was recently increased. He is seeing a therapist regularly. 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 He feels like he cannot concentrate. He admits to fantasying about killing himself but has never made a plan. "I would not leave my family like that".   .. Active Ambulatory Problems    Diagnosis Date Noted  . Essential hypertension, benign 11/04/2013  . Anxiety 11/04/2013  . Depression 11/04/2013  . Non morbid obesity, unspecified obesity type 11/04/2013  . GERD (gastroesophageal reflux disease) 11/04/2013  . Tarsometatarsal osteoarthritis of both feet 06/20/2014  .  Bilateral extensor hallucis longus tendinitis 06/27/2014  . Metatarsal stress fracture of left foot 07/08/2014  . Prediabetes 07/09/2014  . Gout 07/09/2014  . Moderate persistent asthma 11/26/2014  . Eosinophilia 06/26/2015  . Male hypogonadism 07/06/2015  . Left ear pain 12/02/2015  . Plantar fasciitis, left 03/09/2016  . Elevated hematocrit 04/06/2016  . Ventral hernia without obstruction or gangrene 06/01/2016  . Plantar fascial fibromatosis of left foot 12/14/2016  . Bilateral metatarsal stress injury 05/09/2017  . Obstructive sleep apnea 07/17/2014  . Hyperlipidemia 07/18/2017  . Vitamin D deficiency 07/18/2017  . Elevated serum creatinine 07/18/2017  . Low serum vitamin B12 07/18/2017  . Seasonal allergies 06/13/2018  . ED (erectile dysfunction) 04/10/2015  . Epididymal cyst 04/10/2015  . Incomplete emptying of bladder 04/10/2015  . S/P UPPP (uvulopalatopharyngoplasty) 07/17/2014  . Seasonal and perennial allergic rhinitis 07/12/2018  . Allergic conjunctivitis 07/12/2018  . Oral allergy syndrome 07/12/2018  . Stressful life event affecting family 10/30/2018  . Stress due to illness of family member 12/07/2018  . Acute stress reaction 12/07/2018  . Moderate episode of recurrent major depressive disorder ([place]) 04/08/2019   Resolved Ambulatory Problems    Diagnosis Date Noted  . Tinea pedis 06/27/2014  . Acute bronchitis 06/27/2014  . CAP (community acquired pneumonia) 09/18/2014  . Pneumonia 09/18/2014  . Bronchitis, asthmatic 10/15/2014  . Asthma with acute exacerbation 11/26/2014  . Skin lesion of right lower extremity 03/13/2015   Past Medical History:  Diagnosis Date  . Arthritis   .  Asthma   . Hypertension   . Sleep apnea    Reviewed med, allergy, problem list.     Observations/Objective: Depressed mood.   .. Today's Vitals   04/08/19 1040  Temp: 98.4 F (36.9 C)  TempSrc: Oral  Weight: (!) 330 lb (149.7 kg)  Height: 6\' 8"  (2.032 m)   Body mass  index is 36.25 kg/m.   .. Depression screen [facility] 2/9 04/08/2019 12/07/2018 05/29/2018 07/18/2017 05/05/2017  Decreased Interest 2 1 0 0 3  Down, Depressed, Hopeless 3 1 0 0 3  PHQ - 2 Score 5 2 0 0 6  Altered sleeping 3 1 0 - 3  Tired, decreased energy 2 2 2  - 3  Change in appetite 1 1 2  - 3  Feeling bad or failure about yourself  3 0 0 - 3  Trouble concentrating 3 0 0 - 3  Moving slowly or fidgety/restless 0 0 0 - 2  Suicidal thoughts 1 0 0 - 0  PHQ-9 Score 18 6 4  - 23  Difficult doing work/chores Extremely dIfficult Somewhat difficult Not difficult at all - -   .Marland Kitchen GAD 7 : Generalized Anxiety Score 04/08/2019 12/07/2018 05/29/2018  Nervous, Anxious, on Edge 3 2 1   Control/stop worrying 3 2 0  Worry too much - different things 3 2 1   Trouble relaxing 3 2 1   Restless 3 2 0  Easily annoyed or irritable 1 2 0  Afraid - awful might happen 3 2 0  Total GAD 7 Score 19 14 3   Anxiety Difficulty Extremely difficult Very difficult Not difficult at all     Assessment and Plan: .Marland KitchenJulyan was seen today for depression.  Diagnoses and all orders for this visit:  Acute stress reaction -     busPIRone (BUSPAR) 5 MG tablet; Take 1 tablet (5 mg total) by mouth 3 (three) times daily.  Moderate episode of recurrent major depressive disorder (HCC)  Anxiety -     busPIRone (BUSPAR) 5 MG tablet; Take 1 tablet (5 mg total) by mouth 3 (three) times daily.   Pt is under severe stress right now. 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 He feels like under this stress he cannot continue to work. He called HR and waiting for their investigation. I am writing him out of work for 2 weeks. Continue same meds but add buspar TID. Pt has therapist appt today. Wife on phone with him to watch for any concerning behaviors. Rest, mediate, exercise do something that brings hope and joy. Follow up in 2 weeks for next evaluation.    Follow Up Instructions:     I discussed the assessment and treatment  plan with the patient. The patient was provided an opportunity to ask questions and all were answered. The patient agreed with the plan and demonstrated an understanding of the instructions.   The patient was advised to call back or seek an in-person evaluation if the symptoms worsen or if the condition fails to improve as anticipated.  I provided 20 minutes of non-face-to-face time during this encounter.   Iran Planas, PA-C

## 2019-04-11 ENCOUNTER — Telehealth: Payer: Self-pay | Admitting: Neurology

## 2019-04-11 NOTE — Telephone Encounter (Signed)
[employer] forms completed and faxed to [phone] with confirmation received.  ?

## 2019-04-22 ENCOUNTER — Ambulatory Visit (INDEPENDENT_AMBULATORY_CARE_PROVIDER_SITE_OTHER): Payer: BC Managed Care – PPO | Admitting: Physician Assistant

## 2019-04-22 ENCOUNTER — Telehealth: Payer: BC Managed Care – PPO | Admitting: Physician Assistant

## 2019-04-22 ENCOUNTER — Encounter: Payer: Self-pay | Admitting: Physician Assistant

## 2019-04-22 ENCOUNTER — Encounter: Payer: BC Managed Care – PPO | Admitting: Physician Assistant

## 2019-04-22 VITALS — Temp 97.8°F | Ht 78.0 in | Wt 325.4 lb

## 2019-04-22 DIAGNOSIS — F331 Major depressive disorder, recurrent, moderate: Secondary | ICD-10-CM | POA: Diagnosis not present

## 2019-04-22 DIAGNOSIS — F419 Anxiety disorder, unspecified: Secondary | ICD-10-CM | POA: Diagnosis not present

## 2019-04-22 DIAGNOSIS — F430 Acute stress reaction: Secondary | ICD-10-CM

## 2019-04-22 MED ORDER — FLUOXETINE HCL 20 MG PO TABS: 80.0000 mg | ORAL_TABLET | Freq: Every day | ORAL | 1 refills | Status: DC

## 2019-04-22 MED ORDER — FLUOXETINE HCL 20 MG PO TABS: 80.0000 mg | ORAL_TABLET | Freq: Every day | ORAL | 0 refills | Status: DC

## 2019-04-22 MED ORDER — BUSPIRONE HCL 5 MG PO TABS: 5.0000 mg | ORAL_TABLET | Freq: Three times a day (TID) | ORAL | 1 refills | Status: DC

## 2019-04-22 NOTE — Progress Notes (Signed)
Possible Erroneous encounter- see messages  NOTE: If you entered your credit card information for this eVisit, you will not be charged. You may see a "hold" on your card for the $35 but that hold will drop off and you will not have a charge processed.  If you are having a true medical emergency please call 911.     For an urgent face to face visit, [facility] has five urgent care centers for your convenience:    [email or web address] to reserve your spot online an avoid wait times  [facility] (New Address!) [address], [place], [place] [address] *Just off [address], across the road from [place] hours of operation: Monday-Friday, 12 PM to 6 PM  Closed Saturday & Sunday   The following sites will take your insurance:  . [facility]    [phone]                  Get Driving Directions  [address], [place] [address] . 10 am to 8 pm Monday-Friday . 12 pm to 8 pm Saturday-Sunday   . [facility] at [place]                  Get Driving Directions  [address], [place], [place] [address] . 8 am to 8 pm Monday-Friday . 9 am to 6 pm Saturday . 11 am to 6 pm Sunday   . [facility] at [place]                  Get Driving Directions   [address] [place], [place] [address] . 8 am to 8 pm Monday-Friday . 8 am to 4 pm Saturday-Sunday    . [facility] at [place]                    Get Driving Directions  [phone]  [address], [place], [place] [address]  . Monday-Friday, 12 PM to 6 PM    Your e-visit answers were reviewed by a board certified advanced clinical practitioner to complete your personal care plan.  Thank you for using e-Visits. I spent 5-10 minutes on review and completion of this note- [facility]

## 2019-04-22 NOTE — Progress Notes (Signed)
..Virtual Visit via Telephone Note  I connected with Elijah Ford on 04/22/19 at 10:30 AM EDT by telephone and verified that I am speaking with the correct person using two identifiers.  Location: Patient: home Provider: clinic   I discussed the limitations, risks, security and privacy concerns of performing an evaluation and management service by telephone and the availability of in person appointments. I also discussed with the patient that there may be a patient responsible charge related to this service. The patient expressed understanding and agreed to proceed.   History of Present Illness: Pt is a 43 yo male with acute reaction to stress, MDD, anxiety who calls in to follow up on 2 weeks of being written out of work. He has done much better but not under his boss yelling and demeaning him. buspar has helped as well. No SI/HC> he continues to go to therapy.   .. Active Ambulatory Problems    Diagnosis Date Noted  . Essential hypertension, benign 11/04/2013  . Anxiety 11/04/2013  . Depression 11/04/2013  . Non morbid obesity, unspecified obesity type 11/04/2013  . GERD (gastroesophageal reflux disease) 11/04/2013  . Tarsometatarsal osteoarthritis of both feet 06/20/2014  . Bilateral extensor hallucis longus tendinitis 06/27/2014  . Metatarsal stress fracture of left foot 07/08/2014  . Prediabetes 07/09/2014  . Gout 07/09/2014  . Moderate persistent asthma 11/26/2014  . Eosinophilia 06/26/2015  . Male hypogonadism 07/06/2015  . Left ear pain 12/02/2015  . Plantar fasciitis, left 03/09/2016  . Elevated hematocrit 04/06/2016  . Ventral hernia without obstruction or gangrene 06/01/2016  . Plantar fascial fibromatosis of left foot 12/14/2016  . Bilateral metatarsal stress injury 05/09/2017  . Obstructive sleep apnea 07/17/2014  . Hyperlipidemia 07/18/2017  . Vitamin D deficiency 07/18/2017  . Elevated serum creatinine 07/18/2017  . Low serum vitamin B12 07/18/2017  . Seasonal  allergies 06/13/2018  . ED (erectile dysfunction) 04/10/2015  . Epididymal cyst 04/10/2015  . Incomplete emptying of bladder 04/10/2015  . S/P UPPP (uvulopalatopharyngoplasty) 07/17/2014  . Seasonal and perennial allergic rhinitis 07/12/2018  . Allergic conjunctivitis 07/12/2018  . Oral allergy syndrome 07/12/2018  . Stressful life event affecting family 10/30/2018  . Stress due to illness of family member 12/07/2018  . Acute stress reaction 12/07/2018  . Moderate episode of recurrent major depressive disorder ([place]) 04/08/2019   Resolved Ambulatory Problems    Diagnosis Date Noted  . Tinea pedis 06/27/2014  . Acute bronchitis 06/27/2014  . CAP (community acquired pneumonia) 09/18/2014  . Pneumonia 09/18/2014  . Bronchitis, asthmatic 10/15/2014  . Asthma with acute exacerbation 11/26/2014  . Skin lesion of right lower extremity 03/13/2015   Past Medical History:  Diagnosis Date  . Arthritis   . Asthma   . Hypertension   . Sleep apnea    Reviewed med, allergy, problem list.   Observations/Objective: No acute distress. Normal breathing.   .. Today's Vitals   04/22/19 1027  Temp: 97.8 F (36.6 C)  TempSrc: Oral  Weight: (!) 325 lb 6.4 oz (147.6 kg)  Height: 6\' 6"  (1.981 m)   Body mass index is 37.6 kg/m.   Assessment and Plan: .Marland KitchenLarenzo was seen today for follow-up.  Diagnoses and all orders for this visit:  Acute stress reaction -     busPIRone (BUSPAR) 5 MG tablet; Take 1 tablet (5 mg total) by mouth 3 (three) times daily.  Anxiety -     busPIRone (BUSPAR) 5 MG tablet; Take 1 tablet (5 mg total) by mouth 3 (three) times  daily.  Moderate episode of recurrent major depressive disorder (HCC) -     FLUoxetine (PROZAC) 20 MG tablet; Take 4 tablets (80 mg total) by mouth daily.  Other orders -     Discontinue: FLUoxetine (PROZAC) 20 MG tablet; Take 4 tablets (80 mg total) by mouth daily.  Pt is doing better. The time off has helped him regain composure. buspar  has also helped. Written to go back to work.  Continue with therapy. Pt needed prozac reordered through mail order for insurance to pay. Sent today.  Follow up as needed or in 3 months.   Follow Up Instructions:    I discussed the assessment and treatment plan with the patient. The patient was provided an opportunity to ask questions and all were answered. The patient agreed with the plan and demonstrated an understanding of the instructions.   The patient was advised to call back or seek an in-person evaluation if the symptoms worsen or if the condition fails to improve as anticipated.  I provided 7 minutes of non-face-to-face time during this encounter.   Iran Planas, PA-C

## 2019-04-22 NOTE — Progress Notes (Signed)
Duplicate

## 2019-06-04 DIAGNOSIS — G4733 Obstructive sleep apnea (adult) (pediatric): Secondary | ICD-10-CM | POA: Diagnosis not present

## 2019-08-19 ENCOUNTER — Other Ambulatory Visit: Payer: Self-pay | Admitting: Neurology

## 2019-08-19 MED ORDER — ALLOPURINOL 300 MG PO TABS: 300.0000 mg | ORAL_TABLET | Freq: Every day | ORAL | 3 refills | Status: DC

## 2019-08-20 ENCOUNTER — Encounter: Payer: Self-pay | Admitting: Physician Assistant

## 2019-08-20 ENCOUNTER — Ambulatory Visit (INDEPENDENT_AMBULATORY_CARE_PROVIDER_SITE_OTHER): Payer: BC Managed Care – PPO | Admitting: Physician Assistant

## 2019-08-20 ENCOUNTER — Other Ambulatory Visit: Payer: Self-pay

## 2019-08-20 VITALS — Temp 98.1°F | Ht 78.0 in | Wt 330.0 lb

## 2019-08-20 DIAGNOSIS — F419 Anxiety disorder, unspecified: Secondary | ICD-10-CM | POA: Diagnosis not present

## 2019-08-20 DIAGNOSIS — B349 Viral infection, unspecified: Secondary | ICD-10-CM

## 2019-08-20 DIAGNOSIS — Z13220 Encounter for screening for lipoid disorders: Secondary | ICD-10-CM

## 2019-08-20 DIAGNOSIS — F331 Major depressive disorder, recurrent, moderate: Secondary | ICD-10-CM

## 2019-08-20 DIAGNOSIS — R05 Cough: Secondary | ICD-10-CM

## 2019-08-20 DIAGNOSIS — Z20822 Contact with and (suspected) exposure to covid-19: Secondary | ICD-10-CM

## 2019-08-20 DIAGNOSIS — R059 Cough, unspecified: Secondary | ICD-10-CM

## 2019-08-20 DIAGNOSIS — F430 Acute stress reaction: Secondary | ICD-10-CM

## 2019-08-20 DIAGNOSIS — K219 Gastro-esophageal reflux disease without esophagitis: Secondary | ICD-10-CM

## 2019-08-20 DIAGNOSIS — Z125 Encounter for screening for malignant neoplasm of prostate: Secondary | ICD-10-CM

## 2019-08-20 DIAGNOSIS — M109 Gout, unspecified: Secondary | ICD-10-CM

## 2019-08-20 DIAGNOSIS — R7989 Other specified abnormal findings of blood chemistry: Secondary | ICD-10-CM

## 2019-08-20 MED ORDER — ALLOPURINOL 300 MG PO TABS: 300.0000 mg | ORAL_TABLET | Freq: Every day | ORAL | 3 refills | Status: DC

## 2019-08-20 MED ORDER — FLUOXETINE HCL 20 MG PO TABS: 80.0000 mg | ORAL_TABLET | Freq: Every day | ORAL | 1 refills | Status: DC

## 2019-08-20 MED ORDER — BUPROPION HCL ER (XL) 300 MG PO TB24: ORAL_TABLET | ORAL | 1 refills | Status: DC

## 2019-08-20 MED ORDER — OMEPRAZOLE 40 MG PO CPDR: 40.0000 mg | DELAYED_RELEASE_CAPSULE | Freq: Every day | ORAL | 3 refills | Status: DC

## 2019-08-20 MED ORDER — BUSPIRONE HCL 5 MG PO TABS: 5.0000 mg | ORAL_TABLET | Freq: Three times a day (TID) | ORAL | 1 refills | Status: DC

## 2019-08-20 MED ORDER — PROAIR RESPICLICK 108 (90 BASE) MCG/ACT IN AEPB: 2.0000 | INHALATION_SPRAY | RESPIRATORY_TRACT | 3 refills | Status: DC | PRN

## 2019-08-20 NOTE — Progress Notes (Signed)
Cough/chills started yesterday  No sore throat, no change in taste/smell.   Also needs pended medications sent to [facility].

## 2019-08-20 NOTE — Progress Notes (Signed)
Patient ID: GENT RACK, male   DOB: 06-14-1976, 43 y.o.   MRN: [id number] .Elijah KitchenVirtual Visit via Video Note  I connected with [name] on 08/20/19 at  9:50 AM EST by a video enabled telemedicine application and verified that I am speaking with the correct person using two identifiers.  Location: Patient: home Provider: clinic   I discussed the limitations of evaluation and management by telemedicine and the availability of in person appointments. The patient expressed understanding and agreed to proceed.  History of Present Illness: Pt is a 43 yo male with asthma and OSA who calls into the clinic with cough, fatigue, body aches and fever that started yesterday. Pt denies any chills, SOB, wheezing, loss of smell or taste, GI symptoms. He does have some sinus pressure. He works as a Freight forwarder for [employer]. No direct covid exposure. No sick contacts.   Pt needs refills on medications.   Active Ambulatory Problems    Diagnosis Date Noted  . Essential hypertension, benign 11/04/2013  . Anxiety 11/04/2013  . Depression 11/04/2013  . Non morbid obesity, unspecified obesity type 11/04/2013  . GERD (gastroesophageal reflux disease) 11/04/2013  . Tarsometatarsal osteoarthritis of both feet 06/20/2014  . Bilateral extensor hallucis longus tendinitis 06/27/2014  . Metatarsal stress fracture of left foot 07/08/2014  . Prediabetes 07/09/2014  . Gout 07/09/2014  . Moderate persistent asthma 11/26/2014  . Eosinophilia 06/26/2015  . Male hypogonadism 07/06/2015  . Left ear pain 12/02/2015  . Plantar fasciitis, left 03/09/2016  . Elevated hematocrit 04/06/2016  . Ventral hernia without obstruction or gangrene 06/01/2016  . Plantar fascial fibromatosis of left foot 12/14/2016  . Bilateral metatarsal stress injury 05/09/2017  . Obstructive sleep apnea 07/17/2014  . Hyperlipidemia 07/18/2017  . Vitamin D deficiency 07/18/2017  . Elevated serum creatinine 07/18/2017  . Low serum vitamin B12  07/18/2017  . Seasonal allergies 06/13/2018  . ED (erectile dysfunction) 04/10/2015  . Epididymal cyst 04/10/2015  . Incomplete emptying of bladder 04/10/2015  . S/P UPPP (uvulopalatopharyngoplasty) 07/17/2014  . Seasonal and perennial allergic rhinitis 07/12/2018  . Allergic conjunctivitis 07/12/2018  . Oral allergy syndrome 07/12/2018  . Stressful life event affecting family 10/30/2018  . Stress due to illness of family member 12/07/2018  . Acute stress reaction 12/07/2018  . Moderate episode of recurrent major depressive disorder ([place]) 04/08/2019   Resolved Ambulatory Problems    Diagnosis Date Noted  . Tinea pedis 06/27/2014  . Acute bronchitis 06/27/2014  . CAP (community acquired pneumonia) 09/18/2014  . Pneumonia 09/18/2014  . Bronchitis, asthmatic 10/15/2014  . Asthma with acute exacerbation 11/26/2014  . Skin lesion of right lower extremity 03/13/2015   Past Medical History:  Diagnosis Date  . Arthritis   . Asthma   . Hypertension   . Sleep apnea    Reviewed med, allergy, problem list.     Observations/Objective: No acute distress. Fatigued appearing.  Dry cough.  No labored breathing.  No wheezing.   .. Today's Vitals   08/20/19 0817  Temp: 98.1 F (36.7 C)  TempSrc: Oral  Weight: (!) 330 lb (149.7 kg)  Height: 6\' 6"  (1.981 m)   Body mass index is 38.14 kg/m.     Assessment and Plan: .Elijah KitchenRayaan was seen today for cough.  Diagnoses and all orders for this visit:  Viral syndrome  Acute stress reaction -     busPIRone (BUSPAR) 5 MG tablet; Take 1 tablet (5 mg total) by mouth 3 (three) times daily. -  COMPLETE METABOLIC PANEL WITH GFR  Anxiety -     busPIRone (BUSPAR) 5 MG tablet; Take 1 tablet (5 mg total) by mouth 3 (three) times daily. -     COMPLETE METABOLIC PANEL WITH GFR  Moderate episode of recurrent major depressive disorder (HCC) -     FLUoxetine (PROZAC) 20 MG tablet; Take 4 tablets (80 mg total) by mouth daily. -     buPROPion  (WELLBUTRIN XL) 300 MG 24 hr tablet; TAKE 1 TABLET DAILY -     COMPLETE METABOLIC PANEL WITH GFR  Screening for lipid disorders -     Lipid Panel w/reflex Direct LDL  Acute gout, unspecified cause, unspecified site -     allopurinol (ZYLOPRIM) 300 MG tablet; Take 1 tablet (300 mg total) by mouth daily. -     Uric acid  Prostate cancer screening -     CBC -     PSA  Low testosterone in male -     Testosterone  Cough -     Albuterol Sulfate (PROAIR RESPICLICK) 123XX123 (90 Base) MCG/ACT AEPB; Inhale 2 puffs into the lungs every 4 (four) hours as needed.  Gastroesophageal reflux disease, unspecified whether esophagitis present -     omeprazole (PRILOSEC) 40 MG capsule; Take 1 capsule (40 mg total) by mouth daily.  pt needs covid testing. Self isolate until results are back. Note in EMR for work. Discussed symptomatic care. Refilled albuterol inhaler. If breathing symptoms worsening call office for prednisone. If suddenly worsening go to ED. Rest and hydrate.   Labs ordered to have done when feeling better.  Medications refilled.      Follow Up Instructions:    I discussed the assessment and treatment plan with the patient. The patient was provided an opportunity to ask questions and all were answered. The patient agreed with the plan and demonstrated an understanding of the instructions.   The patient was advised to call back or seek an in-person evaluation if the symptoms worsen or if the condition fails to improve as anticipated.     Iran Planas, PA-C

## 2019-08-22 ENCOUNTER — Encounter: Payer: Self-pay | Admitting: Physician Assistant

## 2019-08-22 LAB — NOVEL CORONAVIRUS, NAA: SARS-CoV-2, NAA: NOT DETECTED

## 2019-09-26 DIAGNOSIS — G4733 Obstructive sleep apnea (adult) (pediatric): Secondary | ICD-10-CM | POA: Diagnosis not present

## 2019-10-15 ENCOUNTER — Ambulatory Visit (INDEPENDENT_AMBULATORY_CARE_PROVIDER_SITE_OTHER): Payer: BC Managed Care – PPO | Admitting: Physician Assistant

## 2019-10-15 ENCOUNTER — Other Ambulatory Visit: Payer: Self-pay

## 2019-10-15 VITALS — BP 146/99 | HR 78 | Ht 78.0 in | Wt 330.0 lb

## 2019-10-15 DIAGNOSIS — N489 Disorder of penis, unspecified: Secondary | ICD-10-CM

## 2019-10-15 DIAGNOSIS — R220 Localized swelling, mass and lump, head: Secondary | ICD-10-CM

## 2019-10-15 NOTE — Progress Notes (Signed)
Acute Office Visit  Subjective:    Patient ID: Elijah Ford, male    DOB: [birth year], 44 y.o.   MRN: [id number]  Chief Complaint  Patient presents with  . Cyst    HPI Patient is in today for a "lump on his head".  Noticed about a month ago, just felt it and getting bigger.  Not painful, no headache, vision changes, no tinnitus, no wt loss, no night sweats, no fvr/chills.  No known head injury.   He also mentions a skin mass on his penis. He has notice it starting to grow over the past "few" months. No pain, bleeding, urinary obstruction, difficultly with intercourse. He does feel like it is getting bigger.   Past Medical History:  Diagnosis Date  . Anxiety   . Arthritis    both feet, soreness in multiple areas, plantar fascitis, uses orthodics in both shoes   . Asthma   . Depression    counselling in place, sporadically   . GERD (gastroesophageal reflux disease)   . Hyperlipidemia   . Hypertension    was in the past   . Pneumonia 2015  . Seasonal allergies   . Sleep apnea    CPAP q night     Past Surgical History:  Procedure Laterality Date  . EYE SURGERY     lasik- both   . INGUINAL HERNIA REPAIR Left 07/21/2016   Procedure: REPAIR LEFT INGUINAL HERNIA;  Surgeon: Erroll Luna, MD;  Location: [facility];  Service: General;  Laterality: Left;  . INSERTION OF MESH Left 07/21/2016   Procedure: INSERTION OF MESH;  Surgeon: Erroll Luna, MD;  Location: [facility];  Service: General;  Laterality: Left;  . [place]  . TONSILECTOMY/ADENOIDECTOMY WITH MYRINGOTOMY     no myringotomy   . uvula removal     multiple surgeries for sinuses due to sleep apnea  . VASECTOMY    . VENTRAL HERNIA REPAIR N/A 07/21/2016   Procedure: REPAIR  VENTRAL  HERNIA X 2;  Surgeon: Erroll Luna, MD;  Location: [facility];  Service: General;  Laterality: N/A;    Family History  Problem Relation Age of Onset  . Depression Mother   . Cancer Father   . Alcohol abuse Brother   .  Depression Brother   . Alcohol abuse Brother   . Depression Brother   . Allergic rhinitis Neg Hx   . Angioedema Neg Hx   . Asthma Neg Hx   . Eczema Neg Hx   . Immunodeficiency Neg Hx   . Urticaria Neg Hx     Social History   Socioeconomic History  . Marital status: Married    Spouse name: Not on file  . Number of children: Not on file  . Years of education: Not on file  . Highest education level: Not on file  Occupational History  . Not on file  Tobacco Use  . Smoking status: Never Smoker  . Smokeless tobacco: Never Used  Substance and Sexual Activity  . Alcohol use: No  . Drug use: No  . Sexual activity: Yes  Other Topics Concern  . Not on file  Social History Narrative  . Not on file   Social Determinants of Health   Financial Resource Strain:   . Difficulty of Paying Living Expenses: Not on file  Food Insecurity:   . Worried About Charity fundraiser in the Last Year: Not on file  . Ran Out of Food in the Last  Year: Not on file  Transportation Needs:   . Lack of Transportation (Medical): Not on file  . Lack of Transportation (Non-Medical): Not on file  Physical Activity:   . Days of Exercise per Week: Not on file  . Minutes of Exercise per Session: Not on file  Stress:   . Feeling of Stress : Not on file  Social Connections:   . Frequency of Communication with Friends and Family: Not on file  . Frequency of Social Gatherings with Friends and Family: Not on file  . Attends Religious Services: Not on file  . Active Member of Clubs or Organizations: Not on file  . Attends Archivist Meetings: Not on file  . Marital Status: Not on file  Intimate Partner Violence:   . Fear of Current or Ex-Partner: Not on file  . Emotionally Abused: Not on file  . Physically Abused: Not on file  . Sexually Abused: Not on file    Outpatient Medications Prior to Visit  Medication Sig Dispense Refill  . Albuterol Sulfate (PROAIR RESPICLICK) 123XX123 (90 Base) MCG/ACT  AEPB Inhale 2 puffs into the lungs every 4 (four) hours as needed. 1 each 3  . allopurinol (ZYLOPRIM) 300 MG tablet Take 1 tablet (300 mg total) by mouth daily. 90 tablet 3  . ALPRAZolam (XANAX) 0.5 MG tablet Take 1 tablet (0.5 mg total) by mouth at bedtime as needed for anxiety. 30 tablet 0  . AMBULATORY NON FORMULARY MEDICATION Continuous positive airway pressure (CPAP) machine set at 11 cm of H2O pressure, with all supplemental supplies as needed. 1 each 0  . budesonide-formoterol (SYMBICORT) 160-4.5 MCG/ACT inhaler Inhale 2 puffs into the lungs 2 (two) times daily. 1 Inhaler 0  . buPROPion (WELLBUTRIN XL) 300 MG 24 hr tablet TAKE 1 TABLET DAILY 90 tablet 1  . busPIRone (BUSPAR) 5 MG tablet Take 1 tablet (5 mg total) by mouth 3 (three) times daily. 270 tablet 1  . EPINEPHrine (AUVI-Q) 0.3 mg/0.3 mL IJ SOAJ injection Use as directed for severe allergic reaction. (Patient not taking: Reported on 08/20/2019) 2 Device 2  . FLUoxetine (PROZAC) 20 MG tablet Take 4 tablets (80 mg total) by mouth daily. 360 tablet 1  . montelukast (SINGULAIR) 10 MG tablet Take 1 tablet (10 mg total) by mouth at bedtime. 90 tablet 3  . omeprazole (PRILOSEC) 40 MG capsule Take 1 capsule (40 mg total) by mouth daily. 90 capsule 3   No facility-administered medications prior to visit.    Allergies  Allergen Reactions  . Other Itching    MELONS    Review of Systems See HPI.     Objective:    Physical Exam Soft, Nontender, Mobile, 1/2 cm firm mass on central forehead. No regional lymphadenopathy.  Pt declined genital exam today.        Assessment & Plan:  .Marland KitchenVirden was seen today for cyst.  Diagnoses and all orders for this visit:  Subcutaneous mass of head  Abnormality of penis -     Ambulatory referral to Urology  Lesion of penis -     Ambulatory referral to Urology   Suspect pilar cyst of forehead. Discussed no treatment but waiting to see if it gets more noticeable. Pt wants removed. Scheduled  with Dr. Darene Lamer in office for removal. Reassured patient of benign nature.   Pt declined any genital evaluation. He requested a urology referral. He is not having any obstruction issues. He could be more suited for dermatology/general surgery for removal of a  benign lesion. He is aware since we can't see it today he may indeed have another referral after this referral. He mentions the "growth goes inside" which could mean more urological work up.    Marland KitchenVernetta Honey PA-C, have reviewed and agree with the above documentation in it's entirety.

## 2019-10-16 ENCOUNTER — Encounter: Payer: Self-pay | Admitting: Physician Assistant

## 2019-10-16 DIAGNOSIS — R220 Localized swelling, mass and lump, head: Secondary | ICD-10-CM | POA: Insufficient documentation

## 2019-10-16 DIAGNOSIS — N489 Disorder of penis, unspecified: Secondary | ICD-10-CM | POA: Insufficient documentation

## 2019-10-17 ENCOUNTER — Ambulatory Visit (INDEPENDENT_AMBULATORY_CARE_PROVIDER_SITE_OTHER): Payer: BC Managed Care – PPO | Admitting: Sports Medicine

## 2019-10-17 ENCOUNTER — Other Ambulatory Visit: Payer: Self-pay

## 2019-10-17 DIAGNOSIS — R220 Localized swelling, mass and lump, head: Secondary | ICD-10-CM

## 2019-10-17 NOTE — Progress Notes (Signed)
    Procedures performed today:    Procedure:  Excision of 0.5 cm subcutaneous tumor of the forehead Risks, benefits, and alternatives explained and consent obtained. Time out conducted. Surface prepped with alcohol. 5cc lidocaine with epinephine infiltrated in a field block. Adequate anesthesia ensured. Area prepped and draped in a sterile fashion. Excision performed with: Using a #10 blade I made a transverse incision in one of his forehead wrinkles, then using sharp and blunt dissection I was able to remove the lesion, it appeared to be a lipoma, I then closed the incision with a running subcuticular 5-0 Vicryl suture. Hemostasis achieved. Pt stable.  Independent interpretation of tests performed by another provider:   None.  Impression and Recommendations:    Subcutaneous mass of head Jadeyn has noted an uncomfortable mass on the front of his forehead, superior to his eyebrows, clinically this feels to be either a lipoma or a sebaceous cyst. Today we decided to remove this, that was able to be removed with an incision buried in his forehead wrinkles, using both sharp and blunt dissection I removed it as a hole and closed the incision with absorbable suture. He can use Tylenol and ibuprofen for pain. Return to see me in 10 days for a wound check.    ___________________________________________ Gwen Her. Dianah Field, M.D., ABFM., CAQSM. Primary Care and [place] Instructor of [place] [employer] [facility] [employer]

## 2019-10-17 NOTE — Patient Instructions (Signed)
Incision Care, Adult An incision is a surgical cut that is made through your skin. Most incisions are closed after surgery. Your incision may be closed with stitches (sutures), staples, skin glue, or adhesive strips. You may need to return to your health care provider to have sutures or staples removed. This may occur several days to several weeks after your surgery. The incision needs to be cared for properly to prevent infection. How to care for your incision Incision care   Follow instructions from your health care provider about how to take care of your incision. Make sure you: ? Wash your hands with soap and water before you change the bandage (dressing). If soap and water are not available, use hand sanitizer. ? Change your dressing as told by your health care provider. ? Leave sutures, skin glue, or adhesive strips in place. These skin closures may need to stay in place for 2 weeks or longer. If adhesive strip edges start to loosen and curl up, you may trim the loose edges. Do not remove adhesive strips completely unless your health care provider tells you to do that.  Check your incision area every day for signs of infection. Check for: ? More redness, swelling, or pain. ? More fluid or blood. ? Warmth. ? Pus or a bad smell.  Ask your health care provider how to clean the incision. This may include: ? Using mild soap and water. ? Using a clean towel to pat the incision dry after cleaning it. ? Applying a cream or ointment. Do this only as told by your health care provider. ? Covering the incision with a clean dressing.  Ask your health care provider when you can leave the incision uncovered.  Do not take baths, swim, or use a hot tub until your health care provider approves. Ask your health care provider if you can take showers. You may only be allowed to take sponge baths for bathing. Medicines  If you were prescribed an antibiotic medicine, cream, or ointment, take or apply the  antibiotic as told by your health care provider. Do not stop taking or applying the antibiotic even if your condition improves.  Take over-the-counter and prescription medicines only as told by your health care provider. General instructions  Limit movement around your incision to improve healing. ? Avoid straining, lifting, or exercise for the first month, or for as long as told by your health care provider. ? Follow instructions from your health care provider about returning to your normal activities. ? Ask your health care provider what activities are safe.  Protect your incision from the sun when you are outside for the first 6 months, or for as long as told by your health care provider. Apply sunscreen around the scar or cover it up.  Keep all follow-up visits as told by your health care provider. This is important. Contact a health care provider if:  Your have more redness, swelling, or pain around the incision.  You have more fluid or blood coming from the incision.  Your incision feels warm to the touch.  You have pus or a bad smell coming from the incision.  You have a fever or shaking chills.  You are nauseous or you vomit.  You are dizzy.  Your sutures or staples come undone. Get help right away if:  You have a red streak coming from your incision.  Your incision bleeds through the dressing and the bleeding does not stop with gentle pressure.  The edges of   your incision open up and separate.  You have severe pain.  You have a rash.  You are confused.  You faint.  You have trouble breathing and a fast heartbeat. This information is not intended to replace advice given to you by your health care provider. Make sure you discuss any questions you have with your health care provider. Document Revised: 09/07/2018 Document Reviewed: 03/23/2016 Elsevier Patient Education  2020 Elsevier Inc.   

## 2019-10-17 NOTE — Assessment & Plan Note (Signed)
Elijah Ford has noted an uncomfortable mass on the front of his forehead, superior to his eyebrows, clinically this feels to be either a lipoma or a sebaceous cyst. Today we decided to remove this, that was able to be removed with an incision buried in his forehead wrinkles, using both sharp and blunt dissection I removed it as a hole and closed the incision with absorbable suture. He can use Tylenol and ibuprofen for pain. Return to see me in 10 days for a wound check.

## 2019-10-18 DIAGNOSIS — N486 Induration penis plastica: Secondary | ICD-10-CM | POA: Diagnosis not present

## 2019-10-18 DIAGNOSIS — N5201 Erectile dysfunction due to arterial insufficiency: Secondary | ICD-10-CM | POA: Diagnosis not present

## 2019-10-18 DIAGNOSIS — F5221 Male erectile disorder: Secondary | ICD-10-CM | POA: Diagnosis not present

## 2019-10-28 ENCOUNTER — Ambulatory Visit (INDEPENDENT_AMBULATORY_CARE_PROVIDER_SITE_OTHER): Payer: BC Managed Care – PPO | Admitting: Sports Medicine

## 2019-10-28 ENCOUNTER — Other Ambulatory Visit: Payer: Self-pay

## 2019-10-28 ENCOUNTER — Encounter: Payer: Self-pay | Admitting: Sports Medicine

## 2019-10-28 DIAGNOSIS — R220 Localized swelling, mass and lump, head: Secondary | ICD-10-CM

## 2019-10-28 NOTE — Assessment & Plan Note (Addendum)
Dael returns approximately 10 days post surgical excision of a forehead lipoma. Incision is healing well, disappears well in forehead creases, return as needed.

## 2019-10-28 NOTE — Progress Notes (Signed)
   Impression and Recommendations:         I have performed independent interpretation of the relevant labs and imaging ordered by this patient's other providers.  Subcutaneous mass of head Elijah Ford returns approximately 10 days post surgical excision of a forehead lipoma. Incision is healing well, disappears well in forehead creases, return as needed.    ___________________________________________ Gwen Her. Dianah Field, M.D., ABFM., CAQSM. Primary Care and Sports Medicine [facility]  Adjunct Professor of [place] [employer] [facility] of [employer]

## 2019-12-05 ENCOUNTER — Other Ambulatory Visit: Payer: Self-pay | Admitting: Physician Assistant

## 2019-12-05 DIAGNOSIS — F331 Major depressive disorder, recurrent, moderate: Secondary | ICD-10-CM

## 2019-12-31 DIAGNOSIS — G4733 Obstructive sleep apnea (adult) (pediatric): Secondary | ICD-10-CM | POA: Diagnosis not present

## 2020-02-05 ENCOUNTER — Other Ambulatory Visit: Payer: Self-pay | Admitting: Physician Assistant

## 2020-02-05 ENCOUNTER — Telehealth (INDEPENDENT_AMBULATORY_CARE_PROVIDER_SITE_OTHER): Payer: BC Managed Care – PPO | Admitting: Physician Assistant

## 2020-02-05 ENCOUNTER — Encounter: Payer: Self-pay | Admitting: Physician Assistant

## 2020-02-05 VITALS — Temp 98.8°F

## 2020-02-05 DIAGNOSIS — F419 Anxiety disorder, unspecified: Secondary | ICD-10-CM

## 2020-02-05 DIAGNOSIS — F331 Major depressive disorder, recurrent, moderate: Secondary | ICD-10-CM

## 2020-02-05 DIAGNOSIS — F430 Acute stress reaction: Secondary | ICD-10-CM

## 2020-02-05 DIAGNOSIS — J40 Bronchitis, not specified as acute or chronic: Secondary | ICD-10-CM | POA: Diagnosis not present

## 2020-02-05 DIAGNOSIS — J4540 Moderate persistent asthma, uncomplicated: Secondary | ICD-10-CM

## 2020-02-05 DIAGNOSIS — J3089 Other allergic rhinitis: Secondary | ICD-10-CM | POA: Diagnosis not present

## 2020-02-05 DIAGNOSIS — J329 Chronic sinusitis, unspecified: Secondary | ICD-10-CM | POA: Diagnosis not present

## 2020-02-05 MED ORDER — DOXYCYCLINE HYCLATE 100 MG PO TABS: 100.0000 mg | ORAL_TABLET | Freq: Two times a day (BID) | ORAL | 0 refills | Status: DC

## 2020-02-05 MED ORDER — PREDNISONE 50 MG PO TABS: ORAL_TABLET | ORAL | 0 refills | Status: DC

## 2020-02-05 NOTE — Progress Notes (Signed)
Patient ID: Elijah Ford, male   DOB: 1976-06-26, 44 y.o.   MRN: [id number] .Marland KitchenVirtual Visit via Video Note  I connected with Elijah Ford on 02/05/20 at  9:30 AM EDT by a video enabled telemedicine application and verified that I am speaking with the correct person using two identifiers.  Location: Patient: home Provider: home   I discussed the limitations of evaluation and management by telemedicine and the availability of in person appointments. The patient expressed understanding and agreed to proceed.  History of Present Illness: Pt is a 44 yo male with asthma and seasonal allergies who calls into the clinic with nearing a week of chest congestion, productive cough, sinus pressure, ear pain, headache. No fever, chills, GI symptoms. Taking albuterol/nebulizer, nyquil, dayquil and his daily zyrtec, singulair, symbicort.   .. Active Ambulatory Problems    Diagnosis Date Noted  . Essential hypertension, benign 11/04/2013  . Anxiety 11/04/2013  . Depression 11/04/2013  . Non morbid obesity, unspecified obesity type 11/04/2013  . GERD (gastroesophageal reflux disease) 11/04/2013  . Tarsometatarsal osteoarthritis of both feet 06/20/2014  . Bilateral extensor hallucis longus tendinitis 06/27/2014  . Metatarsal stress fracture of left foot 07/08/2014  . Prediabetes 07/09/2014  . Gout 07/09/2014  . Moderate persistent asthma 11/26/2014  . Eosinophilia 06/26/2015  . Male hypogonadism 07/06/2015  . Left ear pain 12/02/2015  . Plantar fasciitis, left 03/09/2016  . Elevated hematocrit 04/06/2016  . Ventral hernia without obstruction or gangrene 06/01/2016  . Plantar fascial fibromatosis of left foot 12/14/2016  . Bilateral metatarsal stress injury 05/09/2017  . Obstructive sleep apnea 07/17/2014  . Hyperlipidemia 07/18/2017  . Vitamin D deficiency 07/18/2017  . Elevated serum creatinine 07/18/2017  . Low serum vitamin B12 07/18/2017  . Seasonal allergies 06/13/2018  . ED (erectile  dysfunction) 04/10/2015  . Epididymal cyst 04/10/2015  . Incomplete emptying of bladder 04/10/2015  . S/P UPPP (uvulopalatopharyngoplasty) 07/17/2014  . Seasonal and perennial allergic rhinitis 07/12/2018  . Allergic conjunctivitis 07/12/2018  . Oral allergy syndrome 07/12/2018  . Stressful life event affecting family 10/30/2018  . Stress due to illness of family member 12/07/2018  . Acute stress reaction 12/07/2018  . Moderate episode of recurrent major depressive disorder (Elijah Ford) 04/08/2019  . Subcutaneous mass of head 10/16/2019  . Lesion of penis 10/16/2019   Resolved Ambulatory Problems    Diagnosis Date Noted  . Tinea pedis 06/27/2014  . Acute bronchitis 06/27/2014  . CAP (community acquired pneumonia) 09/18/2014  . Pneumonia 09/18/2014  . Bronchitis, asthmatic 10/15/2014  . Asthma with acute exacerbation 11/26/2014  . Skin lesion of right lower extremity 03/13/2015   Past Medical History:  Diagnosis Date  . Arthritis   . Asthma   . Hypertension   . Sleep apnea    Reviewed med, allergy, problem list.     Observations/Objective: No acute distress Productive hacking cough on video.  .. Today's Vitals   02/05/20 1013  Temp: 98.8 F (37.1 C)  TempSrc: Oral   There is no height or weight on file to calculate BMI.     Assessment and Plan: Marland KitchenMarland KitchenDiagnoses and all orders for this visit:  Sinobronchitis -     doxycycline (VIBRA-TABS) 100 MG tablet; Take 1 tablet (100 mg total) by mouth 2 (two) times daily. -     predniSONE (DELTASONE) 50 MG tablet; One tab PO daily for 5 days.  Moderate persistent asthma without complication  Seasonal and perennial allergic rhinitis   Treated with doxycycline and prednisone. Continue allergy medications.  Add flonase. Rest and hydrate. Continue as needed nebulizer.      Follow Up Instructions:    I discussed the assessment and treatment plan with the patient. The patient was provided an opportunity to ask questions and all  were answered. The patient agreed with the plan and demonstrated an understanding of the instructions.   The patient was advised to call back or seek an in-person evaluation if the symptoms worsen or if the condition fails to improve as anticipated.  I provided 11 minutes of non-face-to-face time during this encounter.   Iran Planas, PA-C

## 2020-03-02 ENCOUNTER — Encounter: Payer: Self-pay | Admitting: Physician Assistant

## 2020-04-27 ENCOUNTER — Emergency Department (INDEPENDENT_AMBULATORY_CARE_PROVIDER_SITE_OTHER)
Admission: EM | Admit: 2020-04-27 | Discharge: 2020-04-27 | Disposition: A | Discharge status: Home / Self Care | Payer: BC Managed Care – PPO | Source: Home / Self Care

## 2020-04-27 ENCOUNTER — Other Ambulatory Visit: Payer: Self-pay

## 2020-04-27 ENCOUNTER — Emergency Department: Admit: 2020-04-27 | Discharge status: Absent without leave | Payer: Self-pay

## 2020-04-27 DIAGNOSIS — J069 Acute upper respiratory infection, unspecified: Secondary | ICD-10-CM | POA: Diagnosis not present

## 2020-04-27 DIAGNOSIS — J0110 Acute frontal sinusitis, unspecified: Secondary | ICD-10-CM

## 2020-04-27 MED ORDER — IPRATROPIUM BROMIDE 0.06 % NA SOLN: 2.0000 | Freq: Four times a day (QID) | NASAL | 1 refills | Status: DC

## 2020-04-27 MED ORDER — BENZONATATE 100 MG PO CAPS: 100.0000 mg | ORAL_CAPSULE | Freq: Three times a day (TID) | ORAL | 0 refills | Status: DC

## 2020-04-27 MED ORDER — DOXYCYCLINE HYCLATE 100 MG PO CAPS
100.0000 mg | ORAL_CAPSULE | Freq: Two times a day (BID) | ORAL | 0 refills | Status: AC
Start: 2020-04-27 — End: 2020-05-04

## 2020-04-27 NOTE — ED Provider Notes (Signed)
[facility]    CSN: [id number] Arrival date & time: 04/27/20  1406      History   Chief Complaint Chief Complaint  Patient presents with  . Cough    HPI [name] is a 44 y.o. male.   HPI  [facility] is a 44 y.o. male presenting to UC with c/o productive cough for about 4 days, associated sinus congestion pain and pressure. Hx of similar symptoms around same time each year. He usually does well with prednisone and antibiotic.  Denies fever, chills, n/v/d. No chest pain or SOB. No known sick contacts. He received his first dose of Pfizer Covid-19 vaccine on 04/16/20.     Past Medical History:  Diagnosis Date  . Anxiety   . Arthritis    both feet, soreness in multiple areas, plantar fascitis, uses orthodics in both shoes   . Asthma   . Depression    counselling in place, sporadically   . GERD (gastroesophageal reflux disease)   . Hyperlipidemia   . Hypertension    was in the past   . Pneumonia 2015  . Seasonal allergies   . Sleep apnea    CPAP q night     Patient Active Problem List   Diagnosis Date Noted  . Subcutaneous mass of head 10/16/2019  . Lesion of penis 10/16/2019  . Moderate episode of recurrent major depressive disorder ([facility]) 04/08/2019  . Stress due to illness of family member 12/07/2018  . Acute stress reaction 12/07/2018  . Stressful life event affecting family 10/30/2018  . Seasonal and perennial allergic rhinitis 07/12/2018  . Allergic conjunctivitis 07/12/2018  . Oral allergy syndrome 07/12/2018  . Seasonal allergies 06/13/2018  . Hyperlipidemia 07/18/2017  . Vitamin D deficiency 07/18/2017  . Elevated serum creatinine 07/18/2017  . Low serum vitamin B12 07/18/2017  . Bilateral metatarsal stress injury 05/09/2017  . Plantar fascial fibromatosis of left foot 12/14/2016  . Ventral hernia without obstruction or gangrene 06/01/2016  . Elevated hematocrit 04/06/2016  . Plantar fasciitis, left 03/09/2016  . Left ear pain  12/02/2015  . Male hypogonadism 07/06/2015  . Eosinophilia 06/26/2015  . ED (erectile dysfunction) 04/10/2015  . Epididymal cyst 04/10/2015  . Incomplete emptying of bladder 04/10/2015  . Moderate persistent asthma 11/26/2014  . Obstructive sleep apnea 07/17/2014  . S/P UPPP (uvulopalatopharyngoplasty) 07/17/2014  . Prediabetes 07/09/2014  . Gout 07/09/2014  . Metatarsal stress fracture of left foot 07/08/2014  . Bilateral extensor hallucis longus tendinitis 06/27/2014  . Tarsometatarsal osteoarthritis of both feet 06/20/2014  . Essential hypertension, benign 11/04/2013  . Anxiety 11/04/2013  . Depression 11/04/2013  . Non morbid obesity, unspecified obesity type 11/04/2013  . GERD (gastroesophageal reflux disease) 11/04/2013    Past Surgical History:  Procedure Laterality Date  . EYE SURGERY     lasik- both   . INGUINAL HERNIA REPAIR Left 07/21/2016   Procedure: REPAIR LEFT INGUINAL HERNIA;  Surgeon: Erroll Luna, MD;  [facility];  Service: General;  Laterality: Left;  . INSERTION OF MESH Left 07/21/2016   Procedure: INSERTION OF MESH;  Surgeon: Erroll Luna, MD;  [facility];  Service: General;  Laterality: Left;  . [place]  . TONSILECTOMY/ADENOIDECTOMY WITH MYRINGOTOMY     no myringotomy   . uvula removal     multiple surgeries for sinuses due to sleep apnea  . VASECTOMY    . VENTRAL HERNIA REPAIR N/A 07/21/2016   Procedure: REPAIR  VENTRAL  HERNIA X  2;  Surgeon: Erroll Luna, MD;  [facility];  Service: General;  Laterality: N/A;       Home Medications    Prior to Admission medications   Medication Sig Start Date End Date Taking? Authorizing Provider  Albuterol Sulfate (PROAIR RESPICLICK) 629 (90 Base) MCG/ACT AEPB Inhale 2 puffs into the lungs every 4 (four) hours as needed. 08/20/19   Breeback, Royetta Car, PA-C  allopurinol (ZYLOPRIM) 300 MG tablet Take 1 tablet (300 mg total) by mouth daily. 08/20/19   Breeback, Royetta Car, PA-C    ALPRAZolam (XANAX) 0.5 MG tablet Take 1 tablet (0.5 mg total) by mouth at bedtime as needed for anxiety. 01/21/19   Breeback, Jade L, PA-C  AMBULATORY NON FORMULARY MEDICATION Continuous positive airway pressure (CPAP) machine set at 11 cm of H2O pressure, with all supplemental supplies as needed. 04/17/18   Breeback, Jade L, PA-C  benzonatate (TESSALON) 100 MG capsule Take 1-2 capsules (100-200 mg total) by mouth every 8 (eight) hours. 04/27/20   Noe Gens, PA-C  budesonide-formoterol (SYMBICORT) 160-4.5 MCG/ACT inhaler Inhale 2 puffs into the lungs 2 (two) times daily. 03/27/19   Trixie Dredge, PA-C  buPROPion (WELLBUTRIN XL) 300 MG 24 hr tablet TAKE 1 TABLET DAILY 02/06/20   Breeback, Jade L, PA-C  busPIRone (BUSPAR) 5 MG tablet TAKE 1 TABLET 3 TIMES A DAY 02/06/20   Breeback, Jade L, PA-C  doxycycline (VIBRAMYCIN) 100 MG capsule Take 1 capsule (100 mg total) by mouth 2 (two) times daily for 7 days. 04/27/20 05/04/20  Noe Gens, PA-C  EPINEPHrine (AUVI-Q) 0.3 mg/0.3 mL IJ SOAJ injection Use as directed for severe allergic reaction. Patient not taking: Reported on 08/20/2019 07/12/18   Bobbitt, Sed[place] Muta, MD  FLUoxetine (PROZAC) 20 MG tablet TAKE 4 TABLETS (=80MG )     DAILY 12/06/19   Breeback, Jade L, PA-C  ipratropium (ATROVENT) 0.06 % nasal spray Place 2 sprays into both nostrils 4 (four) times daily. 04/27/20   Noe Gens, PA-C  montelukast (SINGULAIR) 10 MG tablet Take 1 tablet (10 mg total) by mouth at bedtime. 07/12/18   Bobbitt, [place] Muta, MD  omeprazole (PRILOSEC) 40 MG capsule Take 1 capsule (40 mg total) by mouth daily. 08/20/19   Breeback, Jade L, PA-C  predniSONE (DELTASONE) 50 MG tablet One tab PO daily for 5 days. 02/05/20   Donella Stade, PA-C    Family History Family History  Problem Relation Age of Onset  . Depression Mother   . Cancer Father   . Alcohol abuse Brother   . Depression Brother   . Alcohol abuse Brother   . Depression Brother   . Allergic  rhinitis Neg Hx   . Angioedema Neg Hx   . Asthma Neg Hx   . Eczema Neg Hx   . Immunodeficiency Neg Hx   . Urticaria Neg Hx     Social History Social History   Tobacco Use  . Smoking status: Never Smoker  . Smokeless tobacco: Never Used  Vaping Use  . Vaping Use: Never used  Substance Use Topics  . Alcohol use: No  . Drug use: No     Allergies   Other   Review of Systems Review of Systems  Constitutional: Negative for chills and fever.  HENT: Positive for congestion, sinus pressure and sinus pain. Negative for ear pain, sore throat, trouble swallowing and voice change.   Respiratory: Positive for cough. Negative for shortness of breath.   Cardiovascular: Negative for chest pain  and palpitations.  Gastrointestinal: Negative for abdominal pain, diarrhea, nausea and vomiting.  Musculoskeletal: Negative for arthralgias, back pain and myalgias.  Skin: Negative for rash.  Neurological: Positive for headaches. Negative for dizziness and light-headedness.  All other systems reviewed and are negative.    Physical Exam Triage Vital Signs ED Triage Vitals  Enc Vitals Group     BP 04/27/20 1445 (!) 142/98     Pulse Rate 04/27/20 1445 79     Resp 04/27/20 1445 16     Temp 04/27/20 1445 98.9 F (37.2 C)     Temp Source 04/27/20 1445 Oral     SpO2 04/27/20 1445 98 %     Weight --      Height --      Head Circumference --      Peak Flow --      Pain Score 04/27/20 1443 0     Pain Loc --      Pain Edu? --      Excl. in [place]? --    No data found.  Updated Vital Signs BP (!) 142/98 (BP Location: Left Arm)   Pulse 79   Temp 98.9 F (37.2 C) (Oral)   Resp 16   SpO2 98%   Visual Acuity Right Eye Distance:   Left Eye Distance:   Bilateral Distance:    Right Eye Near:   Left Eye Near:    Bilateral Near:     Physical Exam Vitals and nursing note reviewed.  Constitutional:      General: He is not in acute distress.    Appearance: Normal appearance. He is  well-developed. He is not ill-appearing, toxic-appearing or diaphoretic.  HENT:     Head: Normocephalic and atraumatic.     Right Ear: Tympanic membrane and ear canal normal.     Left Ear: Tympanic membrane and ear canal normal.     Nose: Mucosal edema present.     Right Sinus: Frontal sinus tenderness present. No maxillary sinus tenderness.     Left Sinus: Frontal sinus tenderness present. No maxillary sinus tenderness.     Mouth/Throat:     Lips: Pink.     Mouth: Mucous membranes are moist.     Pharynx: Oropharynx is clear. Uvula midline.  Cardiovascular:     Rate and Rhythm: Normal rate and regular rhythm.  Pulmonary:     Effort: Pulmonary effort is normal. No respiratory distress.     Breath sounds: Normal breath sounds. No stridor. No wheezing, rhonchi or rales.  Musculoskeletal:        General: Normal range of motion.     Cervical back: Normal range of motion.  Skin:    General: Skin is warm and dry.  Neurological:     Mental Status: He is alert and oriented to person, place, and time.  Psychiatric:        Behavior: Behavior normal.      UC Treatments / Results  Labs (all labs ordered are listed, but only abnormal results are displayed) Labs Reviewed  NOVEL CORONAVIRUS, NAA   Narrative:    Performed at:  [address], [place], [place]  [id number] Lab Director: Rush Farmer MD, Phone:  [phone]  SARS-COV-2, NAA 2 DAY TAT   Narrative:    Performed at:  [address] [place], [place]  [id number] Lab Director: Rush Farmer MD, Phone:  [phone]    EKG   Radiology No results found.  Procedures Procedures (  including critical care time)  Medications Ordered in UC Medications - No data to display  Initial Impression / Assessment and Plan / UC Course  I have reviewed the triage vital signs and the nursing notes.  Pertinent labs & imaging results that were available during my care of the patient were  reviewed by me and considered in my medical decision making (see chart for details).     Encouraged symptomatic tx Will hold off on prednisone given recent Covid-19 vaccine. Prescription given for doxycycline to take if symptoms continue to worsen F/u with PCP AVS given  Final Clinical Impressions(s) / UC Diagnoses   Final diagnoses:  Upper respiratory tract infection, unspecified type  Acute non-recurrent frontal sinusitis     Discharge Instructions     Due to concern for possibly having Covid-19, it is advised that you self-isolate at home until test results come back, usually 2-3 days.  If positive, it is recommended you stay isolated for at least 10 days after symptom onset and 24 hours after last fever without taking medication (whichever is longer).  If you MUST go out, please wear a mask at all times, limit contact with others.   If you test positive, discussed when it would be safe for you to receive your second Covid-19 vaccine.  If you test Negative, be sure to get your second Covid-19 vaccine as scheduled as long as you do not have worsening symptoms- fever, chest pain, body aches, trouble breathing.      ED Prescriptions    Medication Sig Dispense Auth. Provider   ipratropium (ATROVENT) 0.06 % nasal spray Place 2 sprays into both nostrils 4 (four) times daily. 15 mL Leeroy Cha O, PA-C   doxycycline (VIBRAMYCIN) 100 MG capsule Take 1 capsule (100 mg total) by mouth 2 (two) times daily for 7 days. 14 capsule Gerarda Fraction, Oluwanifemi Susman O, PA-C   benzonatate (TESSALON) 100 MG capsule Take 1-2 capsules (100-200 mg total) by mouth every 8 (eight) hours. 21 capsule Noe Gens, [place]     PDMP not reviewed this encounter.   Noe Gens, [place] 04/30/20 [phone]

## 2020-04-27 NOTE — ED Triage Notes (Signed)
Patient presents to Urgent Care with complaints of productive cough since a few days ago. Patient reports he has this happen a few times per year, says his PCP normally prescribes abx and prednisone.

## 2020-04-27 NOTE — Discharge Instructions (Signed)
Due to concern for possibly having Covid-19, it is advised that you self-isolate at home until test results come back, usually 2-3 days.  If positive, it is recommended you stay isolated for at least 10 days after symptom onset and 24 hours after last fever without taking medication (whichever is longer).  If you MUST go out, please wear a mask at all times, limit contact with others.   If you test positive, discussed when it would be safe for you to receive your second Covid-19 vaccine.  If you test Negative, be sure to get your second Covid-19 vaccine as scheduled as long as you do not have worsening symptoms- fever, chest pain, body aches, trouble breathing.

## 2020-04-29 LAB — SARS-COV-2, NAA 2 DAY TAT

## 2020-04-29 LAB — NOVEL CORONAVIRUS, NAA: SARS-CoV-2, NAA: NOT DETECTED

## 2020-05-08 ENCOUNTER — Other Ambulatory Visit: Payer: Self-pay

## 2020-05-08 ENCOUNTER — Ambulatory Visit (INDEPENDENT_AMBULATORY_CARE_PROVIDER_SITE_OTHER): Payer: BC Managed Care – PPO | Admitting: Physician Assistant

## 2020-05-08 ENCOUNTER — Encounter: Payer: Self-pay | Admitting: Physician Assistant

## 2020-05-08 DIAGNOSIS — F419 Anxiety disorder, unspecified: Secondary | ICD-10-CM

## 2020-05-08 DIAGNOSIS — F331 Major depressive disorder, recurrent, moderate: Secondary | ICD-10-CM

## 2020-05-08 DIAGNOSIS — R059 Cough, unspecified: Secondary | ICD-10-CM

## 2020-05-08 DIAGNOSIS — J4540 Moderate persistent asthma, uncomplicated: Secondary | ICD-10-CM

## 2020-05-08 DIAGNOSIS — M109 Gout, unspecified: Secondary | ICD-10-CM

## 2020-05-08 DIAGNOSIS — R05 Cough: Secondary | ICD-10-CM | POA: Diagnosis not present

## 2020-05-08 DIAGNOSIS — F430 Acute stress reaction: Secondary | ICD-10-CM

## 2020-05-08 DIAGNOSIS — J4541 Moderate persistent asthma with (acute) exacerbation: Secondary | ICD-10-CM

## 2020-05-08 DIAGNOSIS — K219 Gastro-esophageal reflux disease without esophagitis: Secondary | ICD-10-CM

## 2020-05-08 MED ORDER — HYDROXYZINE PAMOATE 50 MG PO CAPS: 50.0000 mg | ORAL_CAPSULE | Freq: Three times a day (TID) | ORAL | 1 refills | Status: DC | PRN

## 2020-05-08 MED ORDER — BUPROPION HCL ER (XL) 300 MG PO TB24: 300.0000 mg | ORAL_TABLET | Freq: Every day | ORAL | 1 refills | Status: DC

## 2020-05-08 MED ORDER — ALLOPURINOL 300 MG PO TABS: 300.0000 mg | ORAL_TABLET | Freq: Every day | ORAL | 3 refills | Status: DC

## 2020-05-08 MED ORDER — MONTELUKAST SODIUM 10 MG PO TABS: 10.0000 mg | ORAL_TABLET | Freq: Every day | ORAL | 3 refills | Status: DC

## 2020-05-08 MED ORDER — BUDESONIDE-FORMOTEROL FUMARATE 160-4.5 MCG/ACT IN AERO: 2.0000 | INHALATION_SPRAY | Freq: Two times a day (BID) | RESPIRATORY_TRACT | 3 refills | Status: DC

## 2020-05-08 MED ORDER — BUSPIRONE HCL 5 MG PO TABS: 5.0000 mg | ORAL_TABLET | Freq: Three times a day (TID) | ORAL | 1 refills | Status: DC

## 2020-05-08 MED ORDER — PROAIR RESPICLICK 108 (90 BASE) MCG/ACT IN AEPB: 2.0000 | INHALATION_SPRAY | RESPIRATORY_TRACT | 3 refills | Status: DC | PRN

## 2020-05-08 MED ORDER — FLUOXETINE HCL 20 MG PO TABS: ORAL_TABLET | ORAL | 1 refills | Status: DC

## 2020-05-08 MED ORDER — OMEPRAZOLE 40 MG PO CPDR: 40.0000 mg | DELAYED_RELEASE_CAPSULE | Freq: Every day | ORAL | 3 refills | Status: DC

## 2020-05-08 NOTE — Progress Notes (Signed)
Subjective:    Patient ID: [facility], male    DOB: 30-Apr-1976, 44 y.o.   MRN: [id number]  HPI  Pt Pt is a 44 yo male with HTN, Gout, asthma, allergies, MDD, anxiety who presents to the clinic for medication refills.   Pt has taken another job and will lose insurance for a few months. He needs 90 day supply.   He has no concerns or compliants. He is breathing great. He has both covid vaccines. He denies any SOB, CP, palpitations, headaches, dizziness, or vision changes. He is taking his medication regularly. His mood is ok. There are ups and downs due to family stress.    .. Active Ambulatory Problems    Diagnosis Date Noted   Essential hypertension, benign 11/04/2013   Anxiety 11/04/2013   Depression 11/04/2013   Non morbid obesity, unspecified obesity type 11/04/2013   GERD (gastroesophageal reflux disease) 11/04/2013   Tarsometatarsal osteoarthritis of both feet 06/20/2014   Bilateral extensor hallucis longus tendinitis 06/27/2014   Metatarsal stress fracture of left foot 07/08/2014   Prediabetes 07/09/2014   Gout 07/09/2014   Moderate persistent asthma 11/26/2014   Eosinophilia 06/26/2015   Male hypogonadism 07/06/2015   Left ear pain 12/02/2015   Plantar fasciitis, left 03/09/2016   Elevated hematocrit 04/06/2016   Ventral hernia without obstruction or gangrene 06/01/2016   Plantar fascial fibromatosis of left foot 12/14/2016   Bilateral metatarsal stress injury 05/09/2017   Obstructive sleep apnea 07/17/2014   Hyperlipidemia 07/18/2017   Vitamin D deficiency 07/18/2017   Elevated serum creatinine 07/18/2017   Low serum vitamin B12 07/18/2017   Seasonal allergies 06/13/2018   ED (erectile dysfunction) 04/10/2015   Epididymal cyst 04/10/2015   Incomplete emptying of bladder 04/10/2015   S/P UPPP (uvulopalatopharyngoplasty) 07/17/2014   Seasonal and perennial allergic rhinitis 07/12/2018   Allergic conjunctivitis 07/12/2018   Oral  allergy syndrome 07/12/2018   Stressful life event affecting family 10/30/2018   Stress due to illness of family member 12/07/2018   Acute stress reaction 12/07/2018   Moderate episode of recurrent major depressive disorder ([place]) 04/08/2019   Subcutaneous mass of head 10/16/2019   Lesion of penis 10/16/2019   Resolved Ambulatory Problems    Diagnosis Date Noted   Tinea pedis 06/27/2014   Acute bronchitis 06/27/2014   CAP (community acquired pneumonia) 09/18/2014   Pneumonia 09/18/2014   Bronchitis, asthmatic 10/15/2014   Asthma with acute exacerbation 11/26/2014   Skin lesion of right lower extremity 03/13/2015   Past Medical History:  Diagnosis Date   Arthritis    Asthma    Hypertension    Sleep apnea      Review of Systems  All other systems reviewed and are negative.      Objective:   Physical Exam Vitals reviewed.  Constitutional:      Appearance: Normal appearance. He is obese.  HENT:     Head: Normocephalic.  Cardiovascular:     Rate and Rhythm: Normal rate and regular rhythm.     Pulses: Normal pulses.  Pulmonary:     Effort: Pulmonary effort is normal.     Breath sounds: Normal breath sounds.  Neurological:     General: No focal deficit present.     Mental Status: He is alert and oriented to person, place, and time.  Psychiatric:        Mood and Affect: Mood normal.    .. Depression screen [facility] 2/9 08/20/2019 04/08/2019 12/07/2018 05/29/2018 07/18/2017  Decreased Interest 0 2 1  0 0  Down, Depressed, Hopeless 1 3 1  0 0  PHQ - 2 Score 1 5 2  0 0  Altered sleeping 0 3 1 0 -  Tired, decreased energy 0 2 2 2  -  Change in appetite 0 1 1 2  -  Feeling bad or failure about yourself  1 3 0 0 -  Trouble concentrating 0 3 0 0 -  Moving slowly or fidgety/restless 0 0 0 0 -  Suicidal thoughts 0 1 0 0 -  PHQ-9 Score 2 18 6 4  -  Difficult doing work/chores Not difficult at all Extremely dIfficult Somewhat difficult Not difficult at all -   .. GAD  7 : Generalized Anxiety Score 08/20/2019 04/08/2019 12/07/2018 05/29/2018  Nervous, Anxious, on Edge 1 3 2 1   Control/stop worrying 1 3 2  0  Worry too much - different things 1 3 2 1   Trouble relaxing 3 3 2 1   Restless 0 3 2 0  Easily annoyed or irritable 0 1 2 0  Afraid - awful might happen 1 3 2  0  Total GAD 7 Score 7 19 14 3   Anxiety Difficulty Not difficult at all Extremely difficult Very difficult Not difficult at all           Assessment & Plan:  Marland KitchenMarland KitchenDiagnoses and all orders for this visit:  Cough -     Albuterol Sulfate (PROAIR RESPICLICK) 030 (90 Base) MCG/ACT AEPB; Inhale 2 puffs into the lungs every 4 (four) hours as needed.  Acute gout, unspecified cause, unspecified site -     allopurinol (ZYLOPRIM) 300 MG tablet; Take 1 tablet (300 mg total) by mouth daily.  Moderate persistent asthma without complication -     budesonide-formoterol (SYMBICORT) 160-4.5 MCG/ACT inhaler; Inhale 2 puffs into the lungs 2 (two) times daily.  Moderate episode of recurrent major depressive disorder (H[facility]) -     buPROPion (WELLBUTRIN XL) 300 MG 24 hr tablet; Take 1 tablet (300 mg total) by mouth daily. -     FLUoxetine (PROZAC) 20 MG tablet; TAKE 4 TABLETS (=80MG )     DAILY  Acute stress reaction -     busPIRone (BUSPAR) 5 MG tablet; Take 1 tablet (5 mg total) by mouth 3 (three) times daily.  Anxiety -     busPIRone (BUSPAR) 5 MG tablet; Take 1 tablet (5 mg total) by mouth 3 (three) times daily. -     hydrOXYzine (VISTARIL) 50 MG capsule; Take 1 capsule (50 mg total) by mouth 3 (three) times daily as needed. Reported on 10/07/2015  Moderate persistent asthma with acute exacerbation -     montelukast (SINGULAIR) 10 MG tablet; Take 1 tablet (10 mg total) by mouth at bedtime.  Gastroesophageal reflux disease, unspecified whether esophagitis present -     omeprazole (PRILOSEC) 40 MG capsule; Take 1 capsule (40 mg total) by mouth daily.   Medications refilled.  Needs lab work would like to  wait for new insurance.  PHQ/GAD stable.  Vitals look good.

## 2020-05-11 ENCOUNTER — Other Ambulatory Visit: Payer: Self-pay | Admitting: Physician Assistant

## 2020-05-11 DIAGNOSIS — R059 Cough, unspecified: Secondary | ICD-10-CM

## 2020-05-11 NOTE — Telephone Encounter (Signed)
Per pharmacy - This drug requires a Prior Authorization. Please call [phone] for Prior Authorization or consider Levalbuterol HFA/generic Ventolin HFA/generic ProAir HFA as an alternative medication. Please provide  drug/strength/directions/quantity/refills.

## 2020-05-11 NOTE — Telephone Encounter (Signed)
Ok to send whatever insurance will pay for.

## 2020-05-12 NOTE — Telephone Encounter (Signed)
Okay for change in inhaler?

## 2020-05-15 MED ORDER — ALBUTEROL SULFATE HFA 108 (90 BASE) MCG/ACT IN AERS: 2.0000 | INHALATION_SPRAY | RESPIRATORY_TRACT | 3 refills | Status: DC | PRN

## 2020-05-15 NOTE — Addendum Note (Signed)
Addended by: Towana Badger on: 05/15/2020 09:04 AM   Modules accepted: Orders

## 2020-08-18 ENCOUNTER — Other Ambulatory Visit: Payer: Self-pay

## 2020-08-18 ENCOUNTER — Ambulatory Visit (INDEPENDENT_AMBULATORY_CARE_PROVIDER_SITE_OTHER): Payer: BC Managed Care – PPO | Admitting: Physician Assistant

## 2020-08-18 VITALS — BP 125/79 | HR 79 | Ht 78.0 in | Wt 344.0 lb

## 2020-08-18 DIAGNOSIS — G4733 Obstructive sleep apnea (adult) (pediatric): Secondary | ICD-10-CM

## 2020-08-18 DIAGNOSIS — M1A9XX0 Chronic gout, unspecified, without tophus (tophi): Secondary | ICD-10-CM

## 2020-08-18 DIAGNOSIS — E291 Testicular hypofunction: Secondary | ICD-10-CM

## 2020-08-18 DIAGNOSIS — R7303 Prediabetes: Secondary | ICD-10-CM

## 2020-08-18 DIAGNOSIS — I10 Essential (primary) hypertension: Secondary | ICD-10-CM | POA: Diagnosis not present

## 2020-08-18 DIAGNOSIS — R7989 Other specified abnormal findings of blood chemistry: Secondary | ICD-10-CM

## 2020-08-18 DIAGNOSIS — Z13220 Encounter for screening for lipoid disorders: Secondary | ICD-10-CM

## 2020-08-18 DIAGNOSIS — Z1159 Encounter for screening for other viral diseases: Secondary | ICD-10-CM

## 2020-08-18 DIAGNOSIS — Z23 Encounter for immunization: Secondary | ICD-10-CM

## 2020-08-18 NOTE — Patient Instructions (Signed)
[facility] can schedule a troubleshooting appointment with you to evaluate your CPAP machine, but they state you have an unpaid balance before they can do that. You can reach their billing department at [phone] opt 1. You can then reach [facility] to schedule the appointment for troubleshooting at [phone].

## 2020-08-18 NOTE — Progress Notes (Addendum)
Established Patient Office Visit  Subjective:  Patient ID: Elijah Ford, male    DOB: 1975/11/12  Age: 44 y.o. MRN: [id number]   HPI DOMONIQUE BROUILLARD presents for medication refills. PMH of morbid obesity, HTN, gout, asthma, allergies, MDD, anxiety.  Lab work was postponed at 05/08/2020 visit by patient due to insurance issue. He is ok to have drawn now.   Needs new CPAP. It is very loud and just does not seem to be working right. He got in 2019. He is uses it every night.   His mood is good. No concerns or complaints.   He is breathing well. No SOB or cough.   No gout flares recently.   He is very complaint with medications.   Past Medical History:  Diagnosis Date  . Anxiety   . Arthritis    both feet, soreness in multiple areas, plantar fascitis, uses orthodics in both shoes   . Asthma   . Depression    counselling in place, sporadically   . GERD (gastroesophageal reflux disease)   . Hyperlipidemia   . Hypertension    was in the past   . Pneumonia 2015  . Seasonal allergies   . Sleep apnea    CPAP q night      Outpatient Medications Prior to Visit  Medication Sig Dispense Refill  . albuterol (VENTOLIN HFA) 108 (90 Base) MCG/ACT inhaler Inhale 2 puffs into the lungs every 4 (four) hours as needed for wheezing or shortness of breath. 18 g 3  . allopurinol (ZYLOPRIM) 300 MG tablet Take 1 tablet (300 mg total) by mouth daily. 90 tablet 3  . ALPRAZolam (XANAX) 0.5 MG tablet Take 1 tablet (0.5 mg total) by mouth at bedtime as needed for anxiety. 30 tablet 0  . AMBULATORY NON FORMULARY MEDICATION Continuous positive airway pressure (CPAP) machine set at 11 cm of H2O pressure, with all supplemental supplies as needed. 1 each 0  . budesonide-formoterol (SYMBICORT) 160-4.5 MCG/ACT inhaler Inhale 2 puffs into the lungs 2 (two) times daily. 3 each 3  . buPROPion (WELLBUTRIN XL) 300 MG 24 hr tablet Take 1 tablet (300 mg total) by mouth daily. 90 tablet 1  . busPIRone (BUSPAR) 5 MG  tablet Take 1 tablet (5 mg total) by mouth 3 (three) times daily. 270 tablet 1  . EPINEPHrine (AUVI-Q) 0.3 mg/0.3 mL IJ SOAJ injection Use as directed for severe allergic reaction. 2 Device 2  . FLUoxetine (PROZAC) 20 MG tablet TAKE 4 TABLETS (=80MG )     DAILY 360 tablet 1  . hydrOXYzine (VISTARIL) 50 MG capsule Take 1 capsule (50 mg total) by mouth 3 (three) times daily as needed. Reported on 10/07/2015 90 capsule 1  . ipratropium (ATROVENT) 0.06 % nasal spray Place 2 sprays into both nostrils 4 (four) times daily. 15 mL 1  . montelukast (SINGULAIR) 10 MG tablet Take 1 tablet (10 mg total) by mouth at bedtime. 90 tablet 3  . omeprazole (PRILOSEC) 40 MG capsule Take 1 capsule (40 mg total) by mouth daily. 90 capsule 3   No facility-administered medications prior to visit.    Allergies  Allergen Reactions  . Other Itching    MELONS    ROS Review of Systems  All other systems reviewed and are negative.     Objective:    Physical Exam Vitals reviewed.  Constitutional:      Appearance: Normal appearance. He is obese.  HENT:     Head: Normocephalic.  Cardiovascular:  Rate and Rhythm: Normal rate and regular rhythm.     Pulses: Normal pulses.  Pulmonary:     Effort: Pulmonary effort is normal.     Breath sounds: Normal breath sounds.  Neurological:     General: No focal deficit present.     Mental Status: He is alert and oriented to person, place, and time.  Psychiatric:        Mood and Affect: Mood normal.     BP 125/79   Pulse 79   Ht 6\' 6"  (1.981 m)   Wt (!) 344 lb (156 kg)   SpO2 98%   BMI 39.75 kg/m  Wt Readings from Last 3 Encounters:  08/18/20 (!) 344 lb (156 kg)  05/08/20 (!) 323 lb (146.5 kg)  10/15/19 (!) 330 lb (149.7 kg)    .Marland Kitchen Depression screen [facility] 2/9 08/20/2019 04/08/2019 12/07/2018 05/29/2018 07/18/2017  Decreased Interest 0 2 1 0 0  Down, Depressed, Hopeless 1 3 1  0 0  PHQ - 2 Score 1 5 2  0 0  Altered sleeping 0 3 1 0 -  Tired, decreased energy  0 2 2 2  -  Change in appetite 0 1 1 2  -  Feeling bad or failure about yourself  1 3 0 0 -  Trouble concentrating 0 3 0 0 -  Moving slowly or fidgety/restless 0 0 0 0 -  Suicidal thoughts 0 1 0 0 -  PHQ-9 Score 2 18 6 4  -  Difficult doing work/chores Not difficult at all Extremely dIfficult Somewhat difficult Not difficult at all -   .. GAD 7 : Generalized Anxiety Score 08/20/2019 04/08/2019 12/07/2018 05/29/2018  Nervous, Anxious, on Edge 1 3 2 1   Control/stop worrying 1 3 2  0  Worry too much - different things 1 3 2 1   Trouble relaxing 3 3 2 1   Restless 0 3 2 0  Easily annoyed or irritable 0 1 2 0  Afraid - awful might happen 1 3 2  0  Total GAD 7 Score 7 19 14 3   Anxiety Difficulty Not difficult at all Extremely difficult Very difficult Not difficult at all     Assessment & Plan:  .Marland KitchenSai was seen today for sleep apnea.  Diagnoses and all orders for this visit:  Essential hypertension, benign -     COMPLETE METABOLIC PANEL WITH GFR  Screening for lipid disorders -     Lipid Panel With LDL/HDL Ratio -     Lipid Panel w/reflex Direct LDL  Morbid obesity (HCC) -     COMPLETE METABOLIC PANEL WITH GFR -     Lipid Panel With LDL/HDL Ratio -     HgB A1c -     CBC w/Diff/Platelet -     TSH  Prediabetes -     COMPLETE METABOLIC PANEL WITH GFR -     HgB A1c  Male hypogonadism -     TSH -     Testosterone Total,Free,Bio, Males -     PSA  Chronic gout without tophus, unspecified cause, unspecified site -     Uric acid  Encounter for hepatitis C screening test for low risk patient -     Hepatitis C Antibody  Flu vaccine need -     Flu Vaccine QUAD 36+ mos IM  OSA (obstructive sleep apnea)   We spoke with CPAP company. UTD sleep study. He just needs to make bill up to date so they can help him again. Pt aware.   Labs printed for  screening.   PHQ/GAD look great. Does not need refills at this time.   Flu/covid vaccine UTD.       Marland KitchenVernetta Honey PA-C, have  reviewed and agree with the above documentation in it's entirety.

## 2020-08-19 ENCOUNTER — Encounter: Payer: Self-pay | Admitting: Physician Assistant

## 2020-08-29 ENCOUNTER — Encounter: Payer: Self-pay | Admitting: Physician Assistant

## 2020-08-29 DIAGNOSIS — F331 Major depressive disorder, recurrent, moderate: Secondary | ICD-10-CM

## 2020-08-29 DIAGNOSIS — K219 Gastro-esophageal reflux disease without esophagitis: Secondary | ICD-10-CM

## 2020-09-04 MED ORDER — FLUOXETINE HCL 20 MG PO TABS: ORAL_TABLET | ORAL | 0 refills | Status: DC

## 2020-09-04 MED ORDER — OMEPRAZOLE 40 MG PO CPDR: 40.0000 mg | DELAYED_RELEASE_CAPSULE | Freq: Every day | ORAL | 0 refills | Status: DC

## 2020-09-04 NOTE — Addendum Note (Signed)
Addended byAnnamaria Helling on: 09/04/2020 02:59 PM   Modules accepted: Orders

## 2020-09-09 DIAGNOSIS — G4733 Obstructive sleep apnea (adult) (pediatric): Secondary | ICD-10-CM | POA: Diagnosis not present

## 2020-09-27 ENCOUNTER — Other Ambulatory Visit: Payer: Self-pay | Admitting: Physician Assistant

## 2020-09-27 DIAGNOSIS — K219 Gastro-esophageal reflux disease without esophagitis: Secondary | ICD-10-CM

## 2020-09-27 DIAGNOSIS — F331 Major depressive disorder, recurrent, moderate: Secondary | ICD-10-CM

## 2020-10-05 ENCOUNTER — Telehealth: Payer: Self-pay | Admitting: Physician Assistant

## 2020-10-05 DIAGNOSIS — U071 COVID-19: Secondary | ICD-10-CM

## 2020-10-05 NOTE — Telephone Encounter (Signed)
Outside lab covid positive.

## 2020-10-06 ENCOUNTER — Telehealth: Payer: Self-pay | Admitting: Adult Health

## 2020-10-06 DIAGNOSIS — J00 Acute nasopharyngitis [common cold]: Secondary | ICD-10-CM | POA: Diagnosis not present

## 2020-10-06 DIAGNOSIS — Z03818 Encounter for observation for suspected exposure to other biological agents ruled out: Secondary | ICD-10-CM | POA: Diagnosis not present

## 2020-10-06 DIAGNOSIS — Z20822 Contact with and (suspected) exposure to covid-19: Secondary | ICD-10-CM | POA: Diagnosis not present

## 2020-10-06 NOTE — Telephone Encounter (Signed)
Called to discuss with patient about COVID-19 symptoms and the use of one of the available treatments for those with mild to moderate Covid symptoms and at a high risk of hospitalization.  Pt appears to qualify for outpatient treatment due to co-morbid conditions and/or a member of an at-risk group in accordance with the FDA Emergency Use Authorization.    Symptom onset: 1/17 Vaccinated:Yes  Booster? No Immunocompromised? No  Qualifiers: BMI, OSA   Talked with patient regarding possible treatment options  , wants to think about it and will call back .   Korben Carcione NP-C

## 2020-10-07 ENCOUNTER — Ambulatory Visit (HOSPITAL_COMMUNITY)
Admission: RE | Admit: 2020-10-07 | Discharge: 2020-10-07 | Disposition: A | Discharge status: Home / Self Care | Payer: BC Managed Care – PPO | Source: Ambulatory Visit | Attending: Pulmonary Disease | Admitting: Pulmonary Disease

## 2020-10-07 ENCOUNTER — Encounter: Payer: Self-pay | Admitting: Family

## 2020-10-07 ENCOUNTER — Other Ambulatory Visit: Payer: Self-pay | Admitting: Family

## 2020-10-07 DIAGNOSIS — U071 COVID-19: Secondary | ICD-10-CM

## 2020-10-07 MED ORDER — FAMOTIDINE IN NACL 20-0.9 MG/50ML-% IV SOLN: 20.0000 mg | Freq: Once | INTRAVENOUS | Status: DC | PRN

## 2020-10-07 MED ORDER — ALBUTEROL SULFATE HFA 108 (90 BASE) MCG/ACT IN AERS: 2.0000 | INHALATION_SPRAY | Freq: Once | RESPIRATORY_TRACT | Status: DC | PRN

## 2020-10-07 MED ORDER — METHYLPREDNISOLONE SODIUM SUCC 125 MG IJ SOLR: 125.0000 mg | Freq: Once | INTRAMUSCULAR | Status: DC | PRN

## 2020-10-07 MED ORDER — SODIUM CHLORIDE 0.9 % IV SOLN: INTRAVENOUS | Status: DC | PRN

## 2020-10-07 MED ORDER — EPINEPHRINE 0.3 MG/0.3ML IJ SOAJ
0.3000 mg | Freq: Once | INTRAMUSCULAR | Status: DC | PRN
Start: 2020-10-07 — End: 2020-10-08

## 2020-10-07 MED ORDER — DIPHENHYDRAMINE HCL 50 MG/ML IJ SOLN: 50.0000 mg | Freq: Once | INTRAMUSCULAR | Status: DC | PRN

## 2020-10-07 MED ORDER — SOTROVIMAB 500 MG/8ML IV SOLN
500.0000 mg | Freq: Once | INTRAVENOUS | Status: AC
  Administered 2020-10-07: 500 mg via INTRAVENOUS

## 2020-10-07 NOTE — Telephone Encounter (Signed)
I connected by phone with [name] on 10/07/2020 at 9:31 AM to discuss the potential use of a new treatment for mild to moderate COVID-19 viral infection in non-hospitalized patients.  This patient is a 45 y.o. male that meets the FDA criteria for Emergency Use Authorization of COVID monoclonal antibody sotrovimab.  Has a (+) direct SARS-CoV-2 viral test result  Has mild or moderate COVID-19   Is NOT hospitalized due to COVID-19  Is within 10 days of symptom onset  Has at least one of the high risk factor(s) for progression to severe COVID-19 and/or hospitalization as defined in EUA.  Specific high risk criteria : BMI > 25 and Chronic Lung Disease   I have spoken and communicated the following to the patient or parent/caregiver regarding COVID monoclonal antibody treatment:  1. FDA has authorized the emergency use for the treatment of mild to moderate COVID-19 in adults and pediatric patients with positive results of direct SARS-CoV-2 viral testing who are 59 years of age and older weighing at least 40 kg, and who are at high risk for progressing to severe COVID-19 and/or hospitalization.  2. The significant known and potential risks and benefits of COVID monoclonal antibody, and the extent to which such potential risks and benefits are unknown.  3. Information on available alternative treatments and the risks and benefits of those alternatives, including clinical trials.  4. Patients treated with COVID monoclonal antibody should continue to self-isolate and use infection control measures (e.g., wear mask, isolate, social distance, avoid sharing personal items, clean and disinfect "high touch" surfaces, and frequent handwashing) according to CDC guidelines.   5. The patient or parent/caregiver has the option to accept or refuse COVID monoclonal antibody treatment.  After reviewing this information with the patient, the patient has agreed to receive one of the available covid 19  monoclonal antibodies and will be provided an appropriate fact sheet prior to infusion. [name], NP 10/07/2020 9:31 AM

## 2020-10-07 NOTE — Progress Notes (Signed)
Diagnosis: COVID-19  Physician: Dr. Patrick Wright  Procedure: Cov[facility] Infusion Clinic Med: Sotrovimab infusion - Provided patient with sotrovimab fact sheet for patients, parents, and caregivers prior to infusion.   Complications: No immediate complications noted  Discharge: Discharged home    

## 2020-10-07 NOTE — Progress Notes (Signed)
Patient reviewed Fact Sheet for Patients, Parents, and Caregivers for Emergency Use Authorization (EUA) of sotrovimab for the Treatment of Coronavirus. Patient also reviewed and is agreeable to the estimated cost of treatment. Patient is agreeable to proceed.   

## 2020-10-07 NOTE — Discharge Instructions (Signed)

## 2020-10-12 ENCOUNTER — Other Ambulatory Visit: Payer: Self-pay | Admitting: Physician Assistant

## 2020-10-12 DIAGNOSIS — K219 Gastro-esophageal reflux disease without esophagitis: Secondary | ICD-10-CM

## 2020-10-18 ENCOUNTER — Encounter: Payer: Self-pay | Admitting: Physician Assistant

## 2020-10-18 DIAGNOSIS — J4540 Moderate persistent asthma, uncomplicated: Secondary | ICD-10-CM

## 2020-10-18 DIAGNOSIS — M109 Gout, unspecified: Secondary | ICD-10-CM

## 2020-10-18 DIAGNOSIS — J4541 Moderate persistent asthma with (acute) exacerbation: Secondary | ICD-10-CM

## 2020-10-18 DIAGNOSIS — F430 Acute stress reaction: Secondary | ICD-10-CM

## 2020-10-18 DIAGNOSIS — F331 Major depressive disorder, recurrent, moderate: Secondary | ICD-10-CM

## 2020-10-18 DIAGNOSIS — F419 Anxiety disorder, unspecified: Secondary | ICD-10-CM

## 2020-10-19 MED ORDER — ALLOPURINOL 300 MG PO TABS: 300.0000 mg | ORAL_TABLET | Freq: Every day | ORAL | 1 refills | Status: DC

## 2020-10-19 MED ORDER — MONTELUKAST SODIUM 10 MG PO TABS: 10.0000 mg | ORAL_TABLET | Freq: Every day | ORAL | 1 refills | Status: DC

## 2020-10-19 MED ORDER — BUSPIRONE HCL 5 MG PO TABS: 5.0000 mg | ORAL_TABLET | Freq: Three times a day (TID) | ORAL | 1 refills | Status: DC

## 2020-10-19 MED ORDER — BUPROPION HCL ER (XL) 300 MG PO TB24: 300.0000 mg | ORAL_TABLET | Freq: Every day | ORAL | 1 refills | Status: DC

## 2020-10-19 MED ORDER — BUDESONIDE-FORMOTEROL FUMARATE 160-4.5 MCG/ACT IN AERO: 2.0000 | INHALATION_SPRAY | Freq: Two times a day (BID) | RESPIRATORY_TRACT | 1 refills | Status: DC

## 2020-10-19 MED ORDER — FLUOXETINE HCL 20 MG PO CAPS: 80.0000 mg | ORAL_CAPSULE | Freq: Every day | ORAL | 1 refills | Status: DC

## 2020-10-19 MED ORDER — HYDROXYZINE PAMOATE 50 MG PO CAPS: 50.0000 mg | ORAL_CAPSULE | Freq: Three times a day (TID) | ORAL | 1 refills | Status: DC | PRN

## 2020-11-09 DIAGNOSIS — J45909 Unspecified asthma, uncomplicated: Secondary | ICD-10-CM | POA: Diagnosis not present

## 2020-11-09 DIAGNOSIS — I10 Essential (primary) hypertension: Secondary | ICD-10-CM | POA: Diagnosis not present

## 2020-11-09 DIAGNOSIS — G510 Bell's palsy: Secondary | ICD-10-CM | POA: Diagnosis not present

## 2020-11-09 DIAGNOSIS — Z79899 Other long term (current) drug therapy: Secondary | ICD-10-CM | POA: Diagnosis not present

## 2020-11-09 DIAGNOSIS — R29810 Facial weakness: Secondary | ICD-10-CM | POA: Diagnosis not present

## 2020-11-09 DIAGNOSIS — E785 Hyperlipidemia, unspecified: Secondary | ICD-10-CM | POA: Diagnosis not present

## 2020-11-09 DIAGNOSIS — K219 Gastro-esophageal reflux disease without esophagitis: Secondary | ICD-10-CM | POA: Diagnosis not present

## 2020-11-09 DIAGNOSIS — Z7952 Long term (current) use of systemic steroids: Secondary | ICD-10-CM | POA: Diagnosis not present

## 2020-11-09 DIAGNOSIS — Z91018 Allergy to other foods: Secondary | ICD-10-CM | POA: Diagnosis not present

## 2020-11-09 DIAGNOSIS — Z8616 Personal history of COVID-19: Secondary | ICD-10-CM | POA: Diagnosis not present

## 2020-11-16 ENCOUNTER — Other Ambulatory Visit: Payer: Self-pay

## 2020-11-16 ENCOUNTER — Telehealth: Payer: Self-pay | Admitting: Physician Assistant

## 2020-11-16 ENCOUNTER — Ambulatory Visit (INDEPENDENT_AMBULATORY_CARE_PROVIDER_SITE_OTHER): Payer: BC Managed Care – PPO | Admitting: Physician Assistant

## 2020-11-16 ENCOUNTER — Encounter: Payer: Self-pay | Admitting: Physician Assistant

## 2020-11-16 VITALS — BP 144/92 | HR 70 | Ht 78.0 in | Wt 343.0 lb

## 2020-11-16 DIAGNOSIS — R7303 Prediabetes: Secondary | ICD-10-CM | POA: Diagnosis not present

## 2020-11-16 DIAGNOSIS — Z1159 Encounter for screening for other viral diseases: Secondary | ICD-10-CM | POA: Diagnosis not present

## 2020-11-16 DIAGNOSIS — Z13220 Encounter for screening for lipoid disorders: Secondary | ICD-10-CM | POA: Diagnosis not present

## 2020-11-16 DIAGNOSIS — E291 Testicular hypofunction: Secondary | ICD-10-CM | POA: Diagnosis not present

## 2020-11-16 DIAGNOSIS — G510 Bell's palsy: Secondary | ICD-10-CM | POA: Diagnosis not present

## 2020-11-16 DIAGNOSIS — I10 Essential (primary) hypertension: Secondary | ICD-10-CM | POA: Diagnosis not present

## 2020-11-16 LAB — LIPID PANEL W/REFLEX DIRECT LDL
Cholesterol: 247 mg/dL — ABNORMAL HIGH (ref <200)
HDL: 45 mg/dL (ref >=40)
LDL Cholesterol (Calc): 145 mg/dL (calc) — ABNORMAL HIGH
Non-HDL Cholesterol (Calc): 202 mg/dL (calc) — ABNORMAL HIGH (ref <130)
Total CHOL/HDL Ratio: 5.5 (calc) — ABNORMAL HIGH (ref <5.0)
Triglycerides: 373 mg/dL — ABNORMAL HIGH (ref <150)

## 2020-11-16 NOTE — Patient Instructions (Signed)
 Bell's Palsy, Adult  Bell's palsy is a short-term inability to move muscles in a part of the face. The inability to move, also called paralysis, results from inflammation or compression of the seventh cranial nerve. This nerve travels along the skull and under the ear to the side of the face. This nerve is responsible for facial movements that include blinking, closing the eyes, smiling, and frowning. What are the causes? The exact cause of this condition is not known. It may be caused by an infection from a virus, such as the chickenpox (herpes zoster), Epstein-Barr, or mumps virus. What increases the risk? You are more likely to develop this condition if:  You are pregnant.  You have diabetes.  You have had a recent infection in your nose, throat, or airways.  You have a weakened body defense system (immune system).  You have had a facial injury, such as a fracture.  You have a family history of Bell's palsy. What are the signs or symptoms? Symptoms of this condition include:  Weakness on one side of the face.  Drooping eyelid and corner of the mouth.  Excessive tearing in one eye.  Difficulty closing the eyelid.  Dry eye.  Drooling.  Dry mouth.  Changes in taste.  Change in facial appearance.  Pain behind one ear.  Ringing in one or both ears.  Sensitivity to sound in one ear.  Facial twitching.  Headache.  Impaired speech.  Dizziness.  Difficulty eating or drinking. Most of the time, only one side of the face is affected. In rare cases, Bell's palsy may affect the whole face. How is this diagnosed? This condition is diagnosed based on:  Your symptoms.  Your medical history.  A physical exam. You may also have to see health care providers who specialize in disorders of the nerves (neurologist) or diseases and conditions of the eye (ophthalmologist). You may have tests, such as:  A test to check for nerve damage (electromyogram).  Imaging  studies, such as a CT scan or an MRI.  Blood tests. How is this treated? This condition affects every person differently. Sometimes symptoms go away without treatment within a couple weeks. If treatment is needed, it varies from person to person. The goal of treatment is to reduce inflammation and protect the eye from damage. Treatment for Bell's palsy may include:  Medicines, such as: ? Steroids to reduce swelling and inflammation. ? Antiviral medicines. ? Pain relievers, including aspirin, acetaminophen, or ibuprofen.  Eye drops or ointment to keep your eye moist.  Eye protection, if you cannot close your eye.  Exercises or massage to regain muscle strength and function (physical therapy). Follow these instructions at home:  Take over-the-counter and prescription medicines only as told by your health care provider.  If your eye is affected: ? Keep your eye moist with eye drops or ointment as told by your health care provider. ? Follow instructions for eye care and protection as told by your health care provider.  Do any physical therapy exercises as told by your health care provider.  Keep all follow-up visits. This is important.   Contact a health care provider if:  You have a fever or chills.  Your symptoms do not get better within 2-3 weeks, or your symptoms get worse.  Your eye is red, irritated, or painful.  You have new symptoms. Get help right away if:  You have weakness or numbness in a part of your body other than your face.  You   have trouble swallowing.  You develop neck pain or stiffness.  You develop dizziness or shortness of breath. Summary  Bell's palsy is a short-term inability to move muscles in a part of the face. The inability to move results from inflammation or compression of the facial nerve.  This condition affects every person differently. Sometimes symptoms go away without treatment within a couple weeks.  If treatment is needed, it varies  from person to person. The goal of treatment is to reduce inflammation and protect the eye from damage.  Contact your health care provider if your symptoms do not get better within 2-3 weeks, or your symptoms get worse. This information is not intended to replace advice given to you by your health care provider. Make sure you discuss any questions you have with your health care provider. Document Revised: 06/04/2020 Document Reviewed: 06/04/2020 Elsevier Patient Education  2021 Elsevier Inc.  

## 2020-11-16 NOTE — Progress Notes (Signed)
Subjective:    Patient ID: Elijah Ford, male    DOB: Aug 02, 1976, 45 y.o.   MRN: [id number]  HPI  Patient is a 45 year old male who presents to the clinic to follow-up after diagnosed with Bell's palsy on 11/09/2020.  His wife noticed a facial droop and he went to the ED.  Symptoms were classic for Bell's palsy to the right.  Patient was given prednisone and Valtrex with artificial eyedrops.  He denies any neurological symptoms.  Symptoms have not improved at all.  Patient denies any new med changes.  His mood is fairly controlled but admits to ongoing stress.  Denies any fever, chills, sore throat, sinus pressure, cough, ear pain or any sickness symptoms.  .. Active Ambulatory Problems    Diagnosis Date Noted  . Essential hypertension, benign 11/04/2013  . Anxiety 11/04/2013  . Depression 11/04/2013  . Morbid obesity ([place]) 11/04/2013  . GERD (gastroesophageal reflux disease) 11/04/2013  . Tarsometatarsal osteoarthritis of both feet 06/20/2014  . Bilateral extensor hallucis longus tendinitis 06/27/2014  . Metatarsal stress fracture of left foot 07/08/2014  . Prediabetes 07/09/2014  . Gout 07/09/2014  . Moderate persistent asthma 11/26/2014  . Eosinophilia 06/26/2015  . Male hypogonadism 07/06/2015  . Left ear pain 12/02/2015  . Plantar fasciitis, left 03/09/2016  . Elevated hematocrit 04/06/2016  . Ventral hernia without obstruction or gangrene 06/01/2016  . Plantar fascial fibromatosis of left foot 12/14/2016  . Bilateral metatarsal stress injury 05/09/2017  . OSA (obstructive sleep apnea) 07/17/2014  . Hyperlipidemia 07/18/2017  . Vitamin D deficiency 07/18/2017  . Elevated serum creatinine 07/18/2017  . Low serum vitamin B12 07/18/2017  . Seasonal allergies 06/13/2018  . ED (erectile dysfunction) 04/10/2015  . Epididymal cyst 04/10/2015  . Incomplete emptying of bladder 04/10/2015  . S/P UPPP (uvulopalatopharyngoplasty) 07/17/2014  . Seasonal and perennial allergic  rhinitis 07/12/2018  . Allergic conjunctivitis 07/12/2018  . Oral allergy syndrome 07/12/2018  . Stressful life event affecting family 10/30/2018  . Stress due to illness of family member 12/07/2018  . Acute stress reaction 12/07/2018  . Moderate episode of recurrent major depressive disorder ([place]) 04/08/2019  . Subcutaneous mass of head 10/16/2019  . Lesion of penis 10/16/2019   Resolved Ambulatory Problems    Diagnosis Date Noted  . Tinea pedis 06/27/2014  . Acute bronchitis 06/27/2014  . CAP (community acquired pneumonia) 09/18/2014  . Pneumonia 09/18/2014  . Bronchitis, asthmatic 10/15/2014  . Asthma with acute exacerbation 11/26/2014  . Skin lesion of right lower extremity 03/13/2015   Past Medical History:  Diagnosis Date  . Arthritis   . Asthma   . Hypertension   . Sleep apnea        Review of Systems  All other systems reviewed and are negative.      Objective:   Physical Exam Vitals reviewed.  Constitutional:      Appearance: Normal appearance.  HENT:     Head: Normocephalic.  Cardiovascular:     Rate and Rhythm: Normal rate and regular rhythm.     Pulses: Normal pulses.  Pulmonary:     Effort: Pulmonary effort is normal.     Breath sounds: Normal breath sounds.  Neurological:     Mental Status: He is alert and oriented to person, place, and time.     Comments: Complete cranial nerve 7 palsy.  Sensory intact.   Psychiatric:        Mood and Affect: Mood normal.  Assessment & Plan:  .Marland KitchenRhyker was seen today for hospitalization follow-up.  Diagnoses and all orders for this visit:  Bell's palsy   Patient has been adequately treated with prednisone and Valtrex.  Continue to keep the eyes lubricated with artificial tears.  Discussed most of the time this is self-limiting and resolves in 2 to 3 weeks.  Patient continues to have no neurological symptoms.  We will hold on any referrals.  Attached an article where we could consider referral  if not improving based on better outcomes if referral is made sooner for plastic reanimation surgery.  From the [place] [facility] The use of CFNG directly to neurotise the affected side represents the ideal surgical option among healthy, motivated patients with residual deficits. That free tissue transfer and, indeed, hypoglossal co-aptation are commonly performed reflects a failure of the referral process. One of the reasons for this is the failure of our specialty to engage with primary care and with patients directly to ensure that the referral process is understood to be critically time dependent. When we consider that the facial nerve is given every chance to demonstrate recovery prior to surgical intervention (usually 3-6 months), then the window of opportunity for achieving the best outcome with the fewest operations is a small one. In most cases it is missed altogether. We must strive to deliver a better service than this. All acute cases of Bell's palsy exhibiting residual symptoms after a few weeks should be considered for referral to a plastic surgical facial reanimation service. The decision when to operate may then be based on the evolving picture. We recommend referral to an otolaryngologist or neurologist when the diagnosis is in doubt (which, admittedly, may often be the case). However, regional facial reanimation services may include otolaryngology and neurology input, and it is worth clarifying this. This should be considered as the default position when managing these infrequent cases in primary care.

## 2020-11-17 ENCOUNTER — Encounter: Payer: Self-pay | Admitting: Physician Assistant

## 2020-11-17 DIAGNOSIS — E1165 Type 2 diabetes mellitus with hyperglycemia: Secondary | ICD-10-CM

## 2020-11-17 HISTORY — DX: Type 2 diabetes mellitus with hyperglycemia: E11.65

## 2020-11-17 LAB — COMPLETE METABOLIC PANEL WITH GFR
AG Ratio: 1.8 (calc) (ref 1.0–2.5)
ALT: 36 U/L (ref 9–46)
AST: 16 U/L (ref 10–40)
Albumin: 4.4 g/dL (ref 3.6–5.1)
Alkaline phosphatase (APISO): 57 U/L (ref 36–130)
BUN: 17 mg/dL (ref 7–25)
CO2: 31 mmol/L (ref 20–32)
Calcium: 9.9 mg/dL (ref 8.6–10.3)
Chloride: 102 mmol/L (ref 98–110)
Creat: 1.29 mg/dL (ref 0.60–1.35)
GFR, Est African American: 77 mL/min/{1.73_m2} (ref >=60)
GFR, Est Non African American: 67 mL/min/{1.73_m2} (ref >=60)
Globulin: 2.4 g/dL (calc) (ref 1.9–3.7)
Glucose, Bld: 134 mg/dL — ABNORMAL HIGH (ref 65–99)
Potassium: 4.3 mmol/L (ref 3.5–5.3)
Sodium: 141 mmol/L (ref 135–146)
Total Bilirubin: 0.5 mg/dL (ref 0.2–1.2)
Total Protein: 6.8 g/dL (ref 6.1–8.1)

## 2020-11-17 LAB — CBC WITH DIFFERENTIAL/PLATELET
Absolute Monocytes: 554 cells/uL (ref 200–950)
Basophils Absolute: 53 cells/uL (ref 0–200)
Basophils Relative: 0.6 %
Eosinophils Absolute: 79 cells/uL (ref 15–500)
Eosinophils Relative: 0.9 %
HCT: 47.5 % (ref 38.5–50.0)
Hemoglobin: 16.4 g/dL (ref 13.2–17.1)
Lymphs Abs: 2693 cells/uL (ref 850–3900)
MCH: 30.7 pg (ref 27.0–33.0)
MCHC: 34.5 g/dL (ref 32.0–36.0)
MCV: 88.8 fL (ref 80.0–100.0)
MPV: 9.5 fL (ref 7.5–12.5)
Monocytes Relative: 6.3 %
Neutro Abs: 5421 cells/uL (ref 1500–7800)
Neutrophils Relative %: 61.6 %
Platelets: 292 10*3/uL (ref 140–400)
RBC: 5.35 10*6/uL (ref 4.20–5.80)
RDW: 14.8 % (ref 11.0–15.0)
Total Lymphocyte: 30.6 %
WBC: 8.8 10*3/uL (ref 3.8–10.8)

## 2020-11-17 LAB — HEMOGLOBIN A1C
Hgb A1c MFr Bld: 7 % of total Hgb — ABNORMAL HIGH (ref <5.7)
Mean Plasma Glucose: 154 mg/dL
eAG (mmol/L): 8.5 mmol/L

## 2020-11-17 LAB — TESTOSTERONE TOTAL,FREE,BIO, MALES
Albumin: 4.4 g/dL (ref 3.6–5.1)
Sex Hormone Binding: 7 nmol/L — ABNORMAL LOW (ref 10–50)
Testosterone: 124 ng/dL — ABNORMAL LOW (ref 250–827)

## 2020-11-17 LAB — PSA: PSA: 0.48 ng/mL (ref <=4.0)

## 2020-11-17 LAB — URIC ACID: Uric Acid, Serum: 5.2 mg/dL (ref 4.0–8.0)

## 2020-11-17 LAB — HEPATITIS C ANTIBODY
Hepatitis C Ab: NONREACTIVE
SIGNAL TO CUT-OFF: 0.01 (ref <1.00)

## 2020-11-17 LAB — TSH: TSH: 1.5 mIU/L (ref 0.40–4.50)

## 2020-11-17 MED ORDER — TESTOSTERONE CYPIONATE 200 MG/ML IM SOLN: 200.0000 mg | INTRAMUSCULAR | 0 refills | Status: DC

## 2020-11-17 MED ORDER — "BD ECLIPSE NEEDLE 22G X 1-1/2"" MISC": 1 refills | Status: DC

## 2020-11-17 MED ORDER — METFORMIN HCL 1000 MG PO TABS: 1000.0000 mg | ORAL_TABLET | Freq: Two times a day (BID) | ORAL | 0 refills | Status: DC

## 2020-11-17 MED ORDER — OZEMPIC (0.25 OR 0.5 MG/DOSE) 2 MG/1.5ML ~~LOC~~ SOPN: 0.2500 mg | PEN_INJECTOR | SUBCUTANEOUS | 2 refills | Status: DC

## 2020-11-17 MED ORDER — BD SYRINGE LUER-LOK 3 ML MISC: 1 refills | Status: DC

## 2020-11-17 MED ORDER — ATORVASTATIN CALCIUM 20 MG PO TABS: 20.0000 mg | ORAL_TABLET | Freq: Every day | ORAL | 0 refills | Status: DC

## 2020-11-17 NOTE — Progress Notes (Signed)
[place],   Kidney and liver look great.  Hemoglobin normal.  WBC normal.  Thyroid perfect.  Uric acid suppressed and where we want it to be.  PSA nice and low.   Testosterone VERY low and suggest replacement with shots again. Did you feel better on testosterone? How long have you been off?   You have also developed diabetes. A1C is 7.0. My suggestion is to start metformin and consider the once weekly shots that can help you lose weight but also bring sugar down.  Your new LDL goal is 70. We need to start cholesterol medication to get this down.  Also don't forget the power of diet. Low sugar/Low carb and at least 150 minutes of exercise a week.  I know this is a lot. If you want a phone call to explain or discuss plan we can certainly do that. Let me know your thoughts.  Recheck A1C in 3 months.

## 2020-11-17 NOTE — Telephone Encounter (Signed)
Will you send supplies too.

## 2020-11-17 NOTE — Telephone Encounter (Signed)
Supplies sent, Testosterone pended again, printed and mail order will not accept faxed prescriptions.

## 2020-11-17 NOTE — Telephone Encounter (Signed)
Testosterone not transmitting to [facility], faxed to [facility].

## 2020-11-17 NOTE — Addendum Note (Signed)
Addended byMargarette Asal L on: 11/17/2020 11:10 AM   Modules accepted: Orders

## 2020-11-17 NOTE — Telephone Encounter (Signed)
Pended previous testosterone dosage RX. Sign if appropriate.

## 2020-11-17 NOTE — Addendum Note (Signed)
Addended byAnnamaria Helling on: 11/17/2020 02:12 PM   Modules accepted: Orders

## 2020-11-18 ENCOUNTER — Telehealth: Payer: Self-pay | Admitting: *Deleted

## 2020-11-18 NOTE — Telephone Encounter (Signed)
PA started for testosterone through [facility].  Awaiting response.

## 2020-11-24 ENCOUNTER — Other Ambulatory Visit: Payer: Self-pay | Admitting: Neurology

## 2020-11-24 MED ORDER — BD SYRINGE LUER-LOK 3 ML MISC: 1 refills | Status: AC

## 2020-11-24 MED ORDER — "BD ECLIPSE NEEDLE 22G X 1-1/2"" MISC": 1 refills | Status: DC

## 2020-11-25 NOTE — Telephone Encounter (Signed)
Reviewed chart 

## 2020-12-08 DIAGNOSIS — G4733 Obstructive sleep apnea (adult) (pediatric): Secondary | ICD-10-CM | POA: Diagnosis not present

## 2021-01-05 ENCOUNTER — Encounter: Payer: Self-pay | Admitting: Physician Assistant

## 2021-02-12 ENCOUNTER — Other Ambulatory Visit: Payer: Self-pay | Admitting: Physician Assistant

## 2021-02-26 ENCOUNTER — Other Ambulatory Visit: Payer: Self-pay | Admitting: Physician Assistant

## 2021-03-10 DIAGNOSIS — G4733 Obstructive sleep apnea (adult) (pediatric): Secondary | ICD-10-CM | POA: Diagnosis not present

## 2021-04-12 ENCOUNTER — Other Ambulatory Visit: Payer: Self-pay | Admitting: Physician Assistant

## 2021-04-12 DIAGNOSIS — K219 Gastro-esophageal reflux disease without esophagitis: Secondary | ICD-10-CM

## 2021-04-16 ENCOUNTER — Encounter: Payer: Self-pay | Admitting: Physician Assistant

## 2021-04-19 MED ORDER — TESTOSTERONE CYPIONATE 200 MG/ML IM SOLN: 200.0000 mg | INTRAMUSCULAR | 1 refills | Status: DC

## 2021-06-09 ENCOUNTER — Telehealth (INDEPENDENT_AMBULATORY_CARE_PROVIDER_SITE_OTHER): Payer: BC Managed Care – PPO | Admitting: Physician Assistant

## 2021-06-09 ENCOUNTER — Encounter: Payer: Self-pay | Admitting: Physician Assistant

## 2021-06-09 VITALS — Temp 97.8°F

## 2021-06-09 DIAGNOSIS — J329 Chronic sinusitis, unspecified: Secondary | ICD-10-CM

## 2021-06-09 DIAGNOSIS — F331 Major depressive disorder, recurrent, moderate: Secondary | ICD-10-CM | POA: Diagnosis not present

## 2021-06-09 DIAGNOSIS — M109 Gout, unspecified: Secondary | ICD-10-CM

## 2021-06-09 DIAGNOSIS — J40 Bronchitis, not specified as acute or chronic: Secondary | ICD-10-CM

## 2021-06-09 DIAGNOSIS — K219 Gastro-esophageal reflux disease without esophagitis: Secondary | ICD-10-CM | POA: Diagnosis not present

## 2021-06-09 MED ORDER — FLUOXETINE HCL 20 MG PO CAPS: 80.0000 mg | ORAL_CAPSULE | Freq: Every day | ORAL | 3 refills | Status: DC

## 2021-06-09 MED ORDER — ALLOPURINOL 300 MG PO TABS: 300.0000 mg | ORAL_TABLET | Freq: Every day | ORAL | 3 refills | Status: DC

## 2021-06-09 MED ORDER — AZITHROMYCIN 250 MG PO TABS: ORAL_TABLET | ORAL | 0 refills | Status: DC

## 2021-06-09 MED ORDER — BENZONATATE 200 MG PO CAPS: 200.0000 mg | ORAL_CAPSULE | Freq: Two times a day (BID) | ORAL | 0 refills | Status: DC | PRN

## 2021-06-09 MED ORDER — OMEPRAZOLE 40 MG PO CPDR: 40.0000 mg | DELAYED_RELEASE_CAPSULE | Freq: Every day | ORAL | 3 refills | Status: DC

## 2021-06-09 MED ORDER — BUPROPION HCL ER (XL) 300 MG PO TB24: 300.0000 mg | ORAL_TABLET | Freq: Every day | ORAL | 3 refills | Status: DC

## 2021-06-09 MED ORDER — PREDNISONE 20 MG PO TABS: ORAL_TABLET | ORAL | 0 refills | Status: DC

## 2021-06-09 NOTE — Progress Notes (Signed)
..Virtual Visit via Video Note  I connected with Elijah Ford on 06/09/21 at  7:50 AM EDT by a video enabled telemedicine application and verified that I am speaking with the correct person using two identifiers.  Location: Patient: car Provider: clinic  .Marland KitchenParticipating in visit:  Patient: Corene Cornea Provider: Iran Planas PA-C Provider in training: Lowanda Foster PA-Student    I discussed the limitations of evaluation and management by telemedicine and the availability of in person appointments. The patient expressed understanding and agreed to proceed.  History of Present Illness: Pt is a 45 yo male with asthma and hx of pneumonia who presents to the clinic with sinus pressure, drainage, headache, cough, chest tightness. Symptoms for the last week. Started in head and moving to chest. Using albuterol more frequently. Staying on symbicort. Taking tylenol cold sinus severe. No fever, chills, body aches. No sick contacts.   Needs some refills of daily medications. Mood control. No problems or concerns.   .. Active Ambulatory Problems    Diagnosis Date Noted   Essential hypertension, benign 11/04/2013   Anxiety 11/04/2013   Depression 11/04/2013   Morbid obesity ([place]) 11/04/2013   GERD (gastroesophageal reflux disease) 11/04/2013   Tarsometatarsal osteoarthritis of both feet 06/20/2014   Bilateral extensor hallucis longus tendinitis 06/27/2014   Metatarsal stress fracture of left foot 07/08/2014   Prediabetes 07/09/2014   Gout 07/09/2014   Moderate persistent asthma 11/26/2014   Eosinophilia 06/26/2015   Male hypogonadism 07/06/2015   Left ear pain 12/02/2015   Plantar fasciitis, left 03/09/2016   Elevated hematocrit 04/06/2016   Ventral hernia without obstruction or gangrene 06/01/2016   Plantar fascial fibromatosis of left foot 12/14/2016   Bilateral metatarsal stress injury 05/09/2017   OSA (obstructive sleep apnea) 07/17/2014   Hyperlipidemia LDL goal <70 07/18/2017   Vitamin  D deficiency 07/18/2017   Elevated serum creatinine 07/18/2017   Low serum vitamin B12 07/18/2017   Seasonal allergies 06/13/2018   ED (erectile dysfunction) 04/10/2015   Epididymal cyst 04/10/2015   Incomplete emptying of bladder 04/10/2015   S/P UPPP (uvulopalatopharyngoplasty) 07/17/2014   Seasonal and perennial allergic rhinitis 07/12/2018   Allergic conjunctivitis 07/12/2018   Oral allergy syndrome 07/12/2018   Stressful life event affecting family 10/30/2018   Stress due to illness of family member 12/07/2018   Acute stress reaction 12/07/2018   Moderate episode of recurrent major depressive disorder ([place]) 04/08/2019   Subcutaneous mass of head 10/16/2019   Lesion of penis 10/16/2019   Bell's palsy 11/16/2020   Type 2 diabetes mellitus with hyperglycemia ([place]) 11/17/2020   Resolved Ambulatory Problems    Diagnosis Date Noted   Tinea pedis 06/27/2014   Acute bronchitis 06/27/2014   CAP (community acquired pneumonia) 09/18/2014   Pneumonia 09/18/2014   Bronchitis, asthmatic 10/15/2014   Asthma with acute exacerbation 11/26/2014   Skin lesion of right lower extremity 03/13/2015   Past Medical History:  Diagnosis Date   Arthritis    Asthma    Hyperlipidemia    Hypertension    Sleep apnea         Observations/Objective: No acute distress No labored breathing no wheezing Productive harsh cough  .Marland Kitchen Today's Vitals   06/09/21 0804  Temp: 97.8 F (36.6 C)   There is no height or weight on file to calculate BMI.     Assessment and Plan: Marland KitchenMarland KitchenDiagnoses and all orders for this visit:  Sinobronchitis -     benzonatate (TESSALON) 200 MG capsule; Take 1 capsule (200 mg total) by  mouth 2 (two) times daily as needed for cough. -     azithromycin (ZITHROMAX Z-PAK) 250 MG tablet; Take 2 tablets (500 mg) on  Day 1,  followed by 1 tablet (250 mg) once daily on Days 2 through 5. -     predniSONE (DELTASONE) 20 MG tablet; Take 3 tablets for 3 days, take 2 tablets for 3  days, take 1 tablet for 3 days, take `1/2 tablet for 4 days.  Moderate episode of recurrent major depressive disorder (HCC) -     buPROPion (WELLBUTRIN XL) 300 MG 24 hr tablet; Take 1 tablet (300 mg total) by mouth daily. -     FLUoxetine (PROZAC) 20 MG capsule; Take 4 capsules (80 mg total) by mouth daily. TAKE 4 CAPSULES EVERY DAY  Gastroesophageal reflux disease, unspecified whether esophagitis present -     omeprazole (PRILOSEC) 40 MG capsule; Take 1 capsule (40 mg total) by mouth daily.  Acute gout, unspecified cause, unspecified site -     allopurinol (ZYLOPRIM) 300 MG tablet; Take 1 tablet (300 mg total) by mouth daily.  Treated with zpak and prednisone with tessalon pearls.  Continue symptomatic care.   GERD-controlled. Refilled omeprazole.   MDD/GAD- controlled. Refilled wellbutrin/prozac.  Gout-no recent flares. Refilled alloupurinol.    Follow Up Instructions:    I discussed the assessment and treatment plan with the patient. The patient was provided an opportunity to ask questions and all were answered. The patient agreed with the plan and demonstrated an understanding of the instructions.   The patient was advised to call back or seek an in-person evaluation if the symptoms worsen or if the condition fails to improve as anticipated.    Iran Planas, PA-C

## 2021-06-10 ENCOUNTER — Encounter: Payer: Self-pay | Admitting: Physician Assistant

## 2021-06-10 DIAGNOSIS — F331 Major depressive disorder, recurrent, moderate: Secondary | ICD-10-CM

## 2021-06-10 MED ORDER — FLUOXETINE HCL 20 MG PO CAPS: 80.0000 mg | ORAL_CAPSULE | Freq: Every day | ORAL | 0 refills | Status: DC

## 2021-08-24 ENCOUNTER — Other Ambulatory Visit: Payer: Self-pay | Admitting: Physician Assistant

## 2021-08-24 DIAGNOSIS — F419 Anxiety disorder, unspecified: Secondary | ICD-10-CM

## 2021-08-24 DIAGNOSIS — F430 Acute stress reaction: Secondary | ICD-10-CM

## 2021-09-03 ENCOUNTER — Ambulatory Visit (INDEPENDENT_AMBULATORY_CARE_PROVIDER_SITE_OTHER): Payer: BC Managed Care – PPO

## 2021-09-03 ENCOUNTER — Ambulatory Visit: Payer: BC Managed Care – PPO | Admitting: Sports Medicine

## 2021-09-03 ENCOUNTER — Other Ambulatory Visit: Payer: Self-pay

## 2021-09-03 DIAGNOSIS — M7542 Impingement syndrome of left shoulder: Secondary | ICD-10-CM

## 2021-09-03 DIAGNOSIS — M7541 Impingement syndrome of right shoulder: Secondary | ICD-10-CM | POA: Diagnosis not present

## 2021-09-03 DIAGNOSIS — M25511 Pain in right shoulder: Secondary | ICD-10-CM | POA: Diagnosis not present

## 2021-09-03 DIAGNOSIS — M25512 Pain in left shoulder: Secondary | ICD-10-CM | POA: Diagnosis not present

## 2021-09-03 MED ORDER — MELOXICAM 15 MG PO TABS: ORAL_TABLET | ORAL | 3 refills | Status: DC

## 2021-09-03 NOTE — Progress Notes (Signed)
° ° °  Procedures performed today:    None.  Independent interpretation of notes and tests performed by another provider:   None.  Brief History, Exam, Impression, and Recommendations:    Impingement syndrome of both shoulders This is a very pleasant 45 year old male, he has had several months of increasing pain both shoulders, localized over the deltoid, worse with overhead activities, abduction. On exam he has good strength but positive Hawkins signs. We discussed alternative machines and exercises in the gym to avoid impingement type positioning. We will do meloxicam daily for 2 weeks then as needed, I would like bilateral shoulder x-rays, we talked about the anatomy and the force coupling function of the rotator cuff, and I would like him to do some cuff conditioning. We can touch base again in 6 weeks after all of the above and consider injections if not sufficiently better.    ___________________________________________ Gwen Her. Dianah Field, M.D., ABFM., CAQSM. Primary Care and [place] Instructor of [employer] [facility]

## 2021-09-03 NOTE — Assessment & Plan Note (Signed)
This is a very pleasant 45 year old male, he has had several months of increasing pain both shoulders, localized over the deltoid, worse with overhead activities, abduction. On exam he has good strength but positive Hawkins signs. We discussed alternative machines and exercises in the gym to avoid impingement type positioning. We will do meloxicam daily for 2 weeks then as needed, I would like bilateral shoulder x-rays, we talked about the anatomy and the force coupling function of the rotator cuff, and I would like him to do some cuff conditioning. We can touch base again in 6 weeks after all of the above and consider injections if not sufficiently better.

## 2021-09-05 ENCOUNTER — Other Ambulatory Visit: Payer: Self-pay | Admitting: Physician Assistant

## 2021-09-05 DIAGNOSIS — F331 Major depressive disorder, recurrent, moderate: Secondary | ICD-10-CM

## 2021-09-24 ENCOUNTER — Ambulatory Visit: Payer: Self-pay

## 2021-09-24 DIAGNOSIS — Z20822 Contact with and (suspected) exposure to covid-19: Secondary | ICD-10-CM | POA: Diagnosis not present

## 2021-09-24 DIAGNOSIS — U071 COVID-19: Secondary | ICD-10-CM | POA: Diagnosis not present

## 2021-10-09 ENCOUNTER — Other Ambulatory Visit: Payer: Self-pay | Admitting: Physician Assistant

## 2021-10-11 NOTE — Telephone Encounter (Signed)
Last written 04/19/2021 #6 ml with 1 refill Last appt 06/09/2021

## 2021-11-01 ENCOUNTER — Ambulatory Visit (INDEPENDENT_AMBULATORY_CARE_PROVIDER_SITE_OTHER): Payer: BC Managed Care – PPO

## 2021-11-01 ENCOUNTER — Ambulatory Visit: Payer: BC Managed Care – PPO | Admitting: Physician Assistant

## 2021-11-01 ENCOUNTER — Other Ambulatory Visit: Payer: Self-pay

## 2021-11-01 ENCOUNTER — Encounter: Payer: Self-pay | Admitting: Physician Assistant

## 2021-11-01 VITALS — BP 136/86 | HR 67 | Ht >= 80 in | Wt 296.0 lb

## 2021-11-01 DIAGNOSIS — Z1329 Encounter for screening for other suspected endocrine disorder: Secondary | ICD-10-CM

## 2021-11-01 DIAGNOSIS — N521 Erectile dysfunction due to diseases classified elsewhere: Secondary | ICD-10-CM

## 2021-11-01 DIAGNOSIS — I10 Essential (primary) hypertension: Secondary | ICD-10-CM

## 2021-11-01 DIAGNOSIS — E538 Deficiency of other specified B group vitamins: Secondary | ICD-10-CM | POA: Diagnosis not present

## 2021-11-01 DIAGNOSIS — R052 Subacute cough: Secondary | ICD-10-CM

## 2021-11-01 DIAGNOSIS — M109 Gout, unspecified: Secondary | ICD-10-CM

## 2021-11-01 DIAGNOSIS — E1165 Type 2 diabetes mellitus with hyperglycemia: Secondary | ICD-10-CM | POA: Diagnosis not present

## 2021-11-01 DIAGNOSIS — E291 Testicular hypofunction: Secondary | ICD-10-CM

## 2021-11-01 DIAGNOSIS — E559 Vitamin D deficiency, unspecified: Secondary | ICD-10-CM

## 2021-11-01 DIAGNOSIS — R059 Cough, unspecified: Secondary | ICD-10-CM | POA: Diagnosis not present

## 2021-11-01 DIAGNOSIS — Z23 Encounter for immunization: Secondary | ICD-10-CM

## 2021-11-01 DIAGNOSIS — Z13220 Encounter for screening for lipoid disorders: Secondary | ICD-10-CM

## 2021-11-01 DIAGNOSIS — D582 Other hemoglobinopathies: Secondary | ICD-10-CM

## 2021-11-01 MED ORDER — VARDENAFIL HCL 10 MG PO TBDP: 1.0000 | ORAL_TABLET | ORAL | 5 refills | Status: DC | PRN

## 2021-11-01 NOTE — Progress Notes (Signed)
Subjective:    Patient ID: Elijah Ford, male    DOB: 1976/08/17, 46 y.o.   MRN: [id number]  HPI Pt is a 46 yo obese male with HTN, asthma, OSA, GERD, hypogonadism, T2DM, HLD who presents to the clinic for medication refills.   He is doing really good. He has lost 45lbs and feeling amazing. He is eating better and exercising regularly. He is very happy with his improvement. Denies any CP, palpitations, headaches, vision changes. SOB.   Metformin is causing GI side effects and he hates to take it. On ozempic. Reflux has worsened quite a bit. On omeprazole 43m daily. No open wounds or sores. No hypoglycemic events.   He does want medication for ED. Request to pay out of pocket for it.   Active Ambulatory Problems    Diagnosis Date Noted   Essential hypertension, benign 11/04/2013   Anxiety 11/04/2013   Depression 11/04/2013   [place]bid [place] 11/04/2013   GERD (gastroesophageal reflux disease) 11/04/2013   Tarsometatarsal osteoarthritis of both feet 06/20/2014   Bilateral extensor hallucis longus tendinitis 06/27/2014   Metatarsal stress fracture of left foot 07/08/2014   Prediabetes 07/09/2014   Gout 07/09/2014   Moderate persistent asthma 11/26/2014   Eosinophilia 06/26/2015   Male hypogonadism 07/06/2015   Left ear pain 12/02/2015   Plantar fasciitis, left 03/09/2016   Elevated hematocrit 04/06/2016   Ventral hernia without obstruction or gangrene 06/01/2016   Plantar fascial fibromatosis of left foot 12/14/2016   Bilateral metatarsal stress injury 05/09/2017   OSA (obstructive sleep apnea) 07/17/2014   Hyperlipidemia LDL goal <70 07/18/2017   Vitamin D deficiency 07/18/2017   Elevated serum creatinine 07/18/2017   Low serum vitamin B12 07/18/2017   Seasonal allergies 06/13/2018   ED (erectile dysfunction) 04/10/2015   Epididymal cyst 04/10/2015   Incomplete emptying of bladder 04/10/2015   S/P UPPP (uvulopalatopharyngoplasty) 07/17/2014   Seasonal and perennial  allergic rhinitis 07/12/2018   Allergic conjunctivitis 07/12/2018   Oral allergy syndrome 07/12/2018   Stressful life event affecting family 10/30/2018   Stress due to illness of family member 12/07/2018   Acute stress reaction 12/07/2018   Moderate episode of recurrent major depressive disorder ([place] 04/08/2019   Subcutaneous mass of head 10/16/2019   Lesion of penis 10/16/2019   Bell's palsy 11/16/2020   Type 2 diabetes mellitus with hyperglycemia ([place] 11/17/2020   Impingement syndrome of both shoulders 09/03/2021   Resolved Ambulatory Problems    Diagnosis Date Noted   Tinea pedis 06/27/2014   Acute bronchitis 06/27/2014   CAP (community acquired pneumonia) 09/18/2014   Pneumonia 09/18/2014   Bronchitis, asthmatic 10/15/2014   Asthma with acute exacerbation 11/26/2014   Skin lesion of right lower extremity 03/13/2015   Past Medical History:  Diagnosis Date   Arthritis    Asthma    Hyperlipidemia    Hypertension    Sleep apnea     Review of Systems See HPI.     Objective:   Physical Exam Vitals reviewed.  Constitutional:      Appearance: Normal appearance. He is obese.  HENT:     Head: Normocephalic.  Cardiovascular:     Rate and Rhythm: Normal rate and regular rhythm.     Pulses: Normal pulses.     Heart sounds: Normal heart sounds.  Pulmonary:     Effort: Pulmonary effort is normal.     Breath sounds: Normal breath sounds.  Abdominal:     General: Bowel sounds are normal.  Palpations: Abdomen is soft.  Musculoskeletal:     Cervical back: Neck supple.  Lymphadenopathy:     Cervical: No cervical adenopathy.  Neurological:     General: No focal deficit present.     Mental Status: He is alert.  Psychiatric:        Mood and Affect: Mood normal.   .. Results for orders placed or performed in visit on 11/01/21  PSA  Result Value Ref Range   PSA 0.80 < OR = 4.00 ng/mL  TSH  Result Value Ref Range   TSH 1.29 0.40 - 4.50 mIU/L  Lipid Panel w/reflex  Direct LDL  Result Value Ref Range   Cholesterol 256 (H) <200 mg/dL   HDL 50 > OR = 40 mg/dL   Triglycerides 183 (H) <150 mg/dL   LDL Cholesterol (Calc) 172 (H) mg/dL (calc)   Total CHOL/HDL Ratio 5.1 (H) <5.0 (calc)   Non-HDL Cholesterol (Calc) 206 (H) <130 mg/dL (calc)  COMPLETE METABOLIC PANEL WITH GFR  Result Value Ref Range   Glucose, Bld 100 (H) 65 - 99 mg/dL   BUN 14 7 - 25 mg/dL   Creat 1.66 (H) 0.60 - 1.29 mg/dL   eGFR 51 (L) > OR = 60 mL/min/1.86m   BUN/Creatinine Ratio 8 6 - 22 (calc)   Sodium 141 135 - 146 mmol/L   Potassium 5.3 3.5 - 5.3 mmol/L   Chloride 104 98 - 110 mmol/L   CO2 30 20 - 32 mmol/L   Calcium 10.6 (H) 8.6 - 10.3 mg/dL   Total Protein 7.3 6.1 - 8.1 g/dL   Albumin 4.9 3.6 - 5.1 g/dL   Globulin 2.4 1.9 - 3.7 g/dL (calc)   AG Ratio 2.0 1.0 - 2.5 (calc)   Total Bilirubin 0.5 0.2 - 1.2 mg/dL   Alkaline phosphatase (APISO) 59 36 - 130 U/L   AST 18 10 - 40 U/L   ALT 25 9 - 46 U/L  CBC with Differential/Platelet  Result Value Ref Range   WBC 7.0 3.8 - 10.8 Thousand/uL   RBC 5.95 (H) 4.20 - 5.80 Million/uL   Hemoglobin 18.2 (H) 13.2 - 17.1 g/dL   HCT 53.5 (H) 38.5 - 50.0 %   MCV 89.9 80.0 - 100.0 fL   MCH 30.6 27.0 - 33.0 pg   MCHC 34.0 32.0 - 36.0 g/dL   RDW 14.4 11.0 - 15.0 %   Platelets 313 140 - 400 Thousand/uL   MPV 9.5 7.5 - 12.5 fL   Neutro Abs 4,494 1,500 - 7,800 cells/uL   Lymphs Abs 1,722 850 - 3,900 cells/uL   Absolute Monocytes 441 200 - 950 cells/uL   Eosinophils Absolute 294 15 - 500 cells/uL   Basophils Absolute 49 0 - 200 cells/uL   Neutrophils Relative % 64.2 %   Total Lymphocyte 24.6 %   Monocytes Relative 6.3 %   Eosinophils Relative 4.2 %   Basophils Relative 0.7 %  Vitamin B12  Result Value Ref Range   Vitamin B-12 600 200 - 1,100 pg/mL  Vitamin D (25 hydroxy)  Result Value Ref Range   Vit D, 25-Hydroxy 17 (L) 30 - 100 ng/mL  Uric acid  Result Value Ref Range   Uric Acid, Serum 5.4 4.0 - 8.0 mg/dL  Hemoglobin A1c   Result Value Ref Range   Hgb A1c MFr Bld 5.8 (H) <5.7 % of total Hgb   Mean Plasma Glucose 120 mg/dL   eAG (mmol/L) 6.6 mmol/L  Testosterone Total,Free,Bio, Males  Result Value Ref  Range   Testosterone 423 250 - 827 ng/dL   Albumin 4.9 3.6 - 5.1 g/dL   Sex Hormone Binding 20 10 - 50 nmol/L   Testosterone, Free 78.7 46.0 - 224.0 pg/mL   Testosterone, Bioavailable 175.6 110.0 - 575.0 ng/dL  Iron, TIBC and Ferritin Panel  Result Value Ref Range   Iron 55 50 - 180 mcg/dL   TIBC 307 250 - 425 mcg/dL (calc)   %SAT 18 (L) 20 - 48 % (calc)   Ferritin 172 38 - 380 ng/mL           Assessment & Plan:  .Marland KitchenMikey was seen today for follow-up.  Diagnoses and all orders for this visit:  Screening for lipid disorders -     Lipid Panel w/reflex Direct LDL  Essential hypertension, benign -     COMPLETE METABOLIC PANEL WITH GFR  Type 2 diabetes mellitus with hyperglycemia, unspecified whether long term insulin use (HCC) -     CBC with Differential/Platelet -     Hemoglobin A1c  Male hypogonadism -     PSA -     CBC with Differential/Platelet -     Testosterone Total,Free,Bio, Males  Low serum vitamin B12 -     Vitamin B12  Vitamin D deficiency -     Vitamin D (25 hydroxy)  Thyroid disorder screen -     TSH  Acute gout, unspecified cause, unspecified site -     Uric acid  Subacute cough -     DG Chest 2 View; Future  Erectile dysfunction due to diseases classified elsewhere -     Vardenafil HCl 10 MG TBDP; Take 1 tablet by mouth as needed. 1 hour before intercourse  Need for influenza vaccination -     Flu Vaccine QUAD 6+ mos PF IM (Fluarix Quad PF)  Elevated hemoglobin (HCC) -     Iron, TIBC and Ferritin Panel  Other orders -     Discontinue: Vardenafil HCl 10 MG TBDP; Take 1 tablet by mouth as needed. 1 hour before intercourse   A1C to be drawn in labs.  Fasting labs ordered.  BP to goal On statin Stop metformin due to GI side effects Stay on  ozempic Increased omeprazole to twice a day.   Ongoing cough get CXR. Sounds like PND.

## 2021-11-01 NOTE — Progress Notes (Signed)
Normal CXR

## 2021-11-01 NOTE — Patient Instructions (Signed)
Increase omeprazole to 40mg  twice a day.  Stop metformin due to GI side effects.

## 2021-11-02 ENCOUNTER — Encounter: Payer: Self-pay | Admitting: Neurology

## 2021-11-02 MED ORDER — ATORVASTATIN CALCIUM 20 MG PO TABS: 20.0000 mg | ORAL_TABLET | Freq: Every day | ORAL | 1 refills | Status: DC

## 2021-11-02 NOTE — Telephone Encounter (Signed)
He is supposed to be on Lipitor 20 mg daily, doesn't sound like he is taking. Want him just to restart that dosage?  2000 units of Vitamin D?

## 2021-11-02 NOTE — Progress Notes (Signed)
Testosterone levels normal.  Uric acid levels look great.  B12 looks amazing.  Thyroid normal. PSA normal.  Cholesterol is elevated. HDL has improved some but LDL has worsened. Increase lipitor to 40mg  daily. Are you ok with me sending over?  Kidney function did drop a little. Avoid anti-inflammatories and drink plenty of water. Recheck in 3 months.  Hemoglobin is elevated as well. Add serum iron and iron stores.  A1C is 5.8 and MUCH better than 11 months ago. Great news. Continue on ozempic.  Vitamin D is very low. How much are you taking and are you taking it regularly?

## 2021-11-02 NOTE — Addendum Note (Signed)
Addended byAnnamaria Helling on: 11/02/2021 02:58 PM   Modules accepted: Orders

## 2021-11-03 ENCOUNTER — Encounter: Payer: Self-pay | Admitting: Physician Assistant

## 2021-11-03 LAB — COMPLETE METABOLIC PANEL WITH GFR
AG Ratio: 2 (calc) (ref 1.0–2.5)
ALT: 25 U/L (ref 9–46)
AST: 18 U/L (ref 10–40)
Albumin: 4.9 g/dL (ref 3.6–5.1)
Alkaline phosphatase (APISO): 59 U/L (ref 36–130)
BUN/Creatinine Ratio: 8 (calc) (ref 6–22)
BUN: 14 mg/dL (ref 7–25)
CO2: 30 mmol/L (ref 20–32)
Calcium: 10.6 mg/dL — ABNORMAL HIGH (ref 8.6–10.3)
Chloride: 104 mmol/L (ref 98–110)
Creat: 1.66 mg/dL — ABNORMAL HIGH (ref 0.60–1.29)
Globulin: 2.4 g/dL (calc) (ref 1.9–3.7)
Glucose, Bld: 100 mg/dL — ABNORMAL HIGH (ref 65–99)
Potassium: 5.3 mmol/L (ref 3.5–5.3)
Sodium: 141 mmol/L (ref 135–146)
Total Bilirubin: 0.5 mg/dL (ref 0.2–1.2)
Total Protein: 7.3 g/dL (ref 6.1–8.1)
eGFR: 51 mL/min/{1.73_m2} — ABNORMAL LOW (ref >=60)

## 2021-11-03 LAB — CBC WITH DIFFERENTIAL/PLATELET
Absolute Monocytes: 441 cells/uL (ref 200–950)
Basophils Absolute: 49 cells/uL (ref 0–200)
Basophils Relative: 0.7 %
Eosinophils Absolute: 294 cells/uL (ref 15–500)
Eosinophils Relative: 4.2 %
HCT: 53.5 % — ABNORMAL HIGH (ref 38.5–50.0)
Hemoglobin: 18.2 g/dL — ABNORMAL HIGH (ref 13.2–17.1)
Lymphs Abs: 1722 cells/uL (ref 850–3900)
MCH: 30.6 pg (ref 27.0–33.0)
MCHC: 34 g/dL (ref 32.0–36.0)
MCV: 89.9 fL (ref 80.0–100.0)
MPV: 9.5 fL (ref 7.5–12.5)
Monocytes Relative: 6.3 %
Neutro Abs: 4494 cells/uL (ref 1500–7800)
Neutrophils Relative %: 64.2 %
Platelets: 313 10*3/uL (ref 140–400)
RBC: 5.95 10*6/uL — ABNORMAL HIGH (ref 4.20–5.80)
RDW: 14.4 % (ref 11.0–15.0)
Total Lymphocyte: 24.6 %
WBC: 7 10*3/uL (ref 3.8–10.8)

## 2021-11-03 LAB — TESTOSTERONE TOTAL,FREE,BIO, MALES
Albumin: 4.9 g/dL (ref 3.6–5.1)
Sex Hormone Binding: 20 nmol/L (ref 10–50)
Testosterone, Bioavailable: 175.6 ng/dL (ref 110.0–575.0)
Testosterone, Free: 78.7 pg/mL (ref 46.0–224.0)
Testosterone: 423 ng/dL (ref 250–827)

## 2021-11-03 LAB — IRON,TIBC AND FERRITIN PANEL
%SAT: 18 % (calc) — ABNORMAL LOW (ref 20–48)
Ferritin: 172 ng/mL (ref 38–380)
Iron: 55 ug/dL (ref 50–180)
TIBC: 307 mcg/dL (calc) (ref 250–425)

## 2021-11-03 LAB — LIPID PANEL W/REFLEX DIRECT LDL
Cholesterol: 256 mg/dL — ABNORMAL HIGH (ref <200)
HDL: 50 mg/dL (ref >=40)
LDL Cholesterol (Calc): 172 mg/dL (calc) — ABNORMAL HIGH
Non-HDL Cholesterol (Calc): 206 mg/dL (calc) — ABNORMAL HIGH (ref <130)
Total CHOL/HDL Ratio: 5.1 (calc) — ABNORMAL HIGH (ref <5.0)
Triglycerides: 183 mg/dL — ABNORMAL HIGH (ref <150)

## 2021-11-03 LAB — VITAMIN B12: Vitamin B-12: 600 pg/mL (ref 200–1100)

## 2021-11-03 LAB — PSA: PSA: 0.8 ng/mL (ref <=4.00)

## 2021-11-03 LAB — HEMOGLOBIN A1C
Hgb A1c MFr Bld: 5.8 % of total Hgb — ABNORMAL HIGH (ref <5.7)
Mean Plasma Glucose: 120 mg/dL
eAG (mmol/L): 6.6 mmol/L

## 2021-11-03 LAB — TSH: TSH: 1.29 mIU/L (ref 0.40–4.50)

## 2021-11-03 LAB — URIC ACID: Uric Acid, Serum: 5.4 mg/dL (ref 4.0–8.0)

## 2021-11-03 LAB — VITAMIN D 25 HYDROXY (VIT D DEFICIENCY, FRACTURES): Vit D, 25-Hydroxy: 17 ng/mL — ABNORMAL LOW (ref 30–100)

## 2021-11-05 NOTE — Progress Notes (Signed)
Iron panel is within normal range. Its likely the testosterone making your hematocrit and hemoglobin go up some. Could you inject instead of 88mL every 2 weeks .54mL every 2 weeks?

## 2022-01-14 ENCOUNTER — Other Ambulatory Visit: Payer: Self-pay | Admitting: Physician Assistant

## 2022-01-17 ENCOUNTER — Other Ambulatory Visit: Payer: Self-pay | Admitting: Neurology

## 2022-01-17 MED ORDER — "BD ECLIPSE NEEDLE 22G X 1-1/2"" MISC": 3 refills | Status: AC

## 2022-01-24 MED ORDER — BD ECLIPSE NEEDLE 21G X 1-1/2" MISC
1.0000 | 0 refills | Status: AC
Start: 2022-01-24 — End: ?

## 2022-01-24 NOTE — Progress Notes (Signed)
[facility] called and left vm stating 22 g needles have been discontinued. Can order 21 g o 23 g. Since injecting testosterone 21 g needle sent to [facility].  ?

## 2022-01-24 NOTE — Addendum Note (Signed)
Addended byAnnamaria Helling on: 01/24/2022 04:05 PM ? ? Modules accepted: Orders ? ?

## 2022-01-31 ENCOUNTER — Ambulatory Visit (INDEPENDENT_AMBULATORY_CARE_PROVIDER_SITE_OTHER): Payer: BC Managed Care – PPO | Admitting: Physician Assistant

## 2022-01-31 VITALS — BP 121/78 | HR 78 | Ht >= 80 in | Wt 305.0 lb

## 2022-01-31 DIAGNOSIS — E785 Hyperlipidemia, unspecified: Secondary | ICD-10-CM

## 2022-01-31 DIAGNOSIS — I10 Essential (primary) hypertension: Secondary | ICD-10-CM | POA: Diagnosis not present

## 2022-01-31 DIAGNOSIS — G4733 Obstructive sleep apnea (adult) (pediatric): Secondary | ICD-10-CM

## 2022-01-31 DIAGNOSIS — Z0000 Encounter for general adult medical examination without abnormal findings: Secondary | ICD-10-CM | POA: Diagnosis not present

## 2022-01-31 DIAGNOSIS — Z6833 Body mass index (BMI) 33.0-33.9, adult: Secondary | ICD-10-CM

## 2022-01-31 DIAGNOSIS — E559 Vitamin D deficiency, unspecified: Secondary | ICD-10-CM | POA: Diagnosis not present

## 2022-01-31 DIAGNOSIS — E1165 Type 2 diabetes mellitus with hyperglycemia: Secondary | ICD-10-CM | POA: Diagnosis not present

## 2022-01-31 DIAGNOSIS — Z1211 Encounter for screening for malignant neoplasm of colon: Secondary | ICD-10-CM | POA: Diagnosis not present

## 2022-01-31 DIAGNOSIS — E6609 Other obesity due to excess calories: Secondary | ICD-10-CM

## 2022-01-31 DIAGNOSIS — E66811 Obesity, class 1: Secondary | ICD-10-CM

## 2022-01-31 LAB — POCT UA - MICROALBUMIN
Albumin/Creatinine Ratio, Urine, POC: <30
Creatinine, POC: 200 mg/dL
Microalbumin Ur, POC: 10 mg/L

## 2022-01-31 MED ORDER — SEMAGLUTIDE (2 MG/DOSE) 8 MG/3ML ~~LOC~~ SOPN: 1.0000 mg | PEN_INJECTOR | SUBCUTANEOUS | 2 refills | Status: DC

## 2022-01-31 NOTE — Patient Instructions (Signed)
Health Maintenance, Male Adopting a healthy lifestyle and getting preventive care are important in promoting health and wellness. Ask your health care provider about: The right schedule for you to have regular tests and exams. Things you can do on your own to prevent diseases and keep yourself healthy. What should I know about diet, weight, and exercise? Eat a healthy diet  Eat a diet that includes plenty of vegetables, fruits, low-fat dairy products, and lean protein. Do not eat a lot of foods that are high in solid fats, added sugars, or sodium. Maintain a healthy weight Body mass index (BMI) is a measurement that can be used to identify possible weight problems. It estimates body fat based on height and weight. Your health care provider can help determine your BMI and help you achieve or maintain a healthy weight. Get regular exercise Get regular exercise. This is one of the most important things you can do for your health. Most adults should: Exercise for at least 150 minutes each week. The exercise should increase your heart rate and make you sweat (moderate-intensity exercise). Do strengthening exercises at least twice a week. This is in addition to the moderate-intensity exercise. Spend less time sitting. Even light physical activity can be beneficial. Watch cholesterol and blood lipids Have your blood tested for lipids and cholesterol at 47 years of age, then have this test every 5 years. You may need to have your cholesterol levels checked more often if: Your lipid or cholesterol levels are high. You are older than 47 years of age. You are at high risk for heart disease. What should I know about cancer screening? Many types of cancers can be detected early and may often be prevented. Depending on your health history and family history, you may need to have cancer screening at various ages. This may include screening for: Colorectal cancer. Prostate cancer. Skin cancer. Lung  cancer. What should I know about heart disease, diabetes, and high blood pressure? Blood pressure and heart disease High blood pressure causes heart disease and increases the risk of stroke. This is more likely to develop in people who have high blood pressure readings or are overweight. Talk with your health care provider about your target blood pressure readings. Have your blood pressure checked: Every 3-5 years if you are 18-39 years of age. Every year if you are 40 years old or older. If you are between the ages of 65 and 75 and are a current or former smoker, ask your health care provider if you should have a one-time screening for abdominal aortic aneurysm (AAA). Diabetes Have regular diabetes screenings. This checks your fasting blood sugar level. Have the screening done: Once every three years after age 45 if you are at a normal weight and have a low risk for diabetes. More often and at a younger age if you are overweight or have a high risk for diabetes. What should I know about preventing infection? Hepatitis B If you have a higher risk for hepatitis B, you should be screened for this virus. Talk with your health care provider to find out if you are at risk for hepatitis B infection. Hepatitis C Blood testing is recommended for: Everyone born from 1945 through 1965. Anyone with known risk factors for hepatitis C. Sexually transmitted infections (STIs) You should be screened each year for STIs, including gonorrhea and chlamydia, if: You are sexually active and are younger than 47 years of age. You are older than 47 years of age and your   health care provider tells you that you are at risk for this type of infection. Your sexual activity has changed since you were last screened, and you are at increased risk for chlamydia or gonorrhea. Ask your health care provider if you are at risk. Ask your health care provider about whether you are at high risk for HIV. Your health care provider  may recommend a prescription medicine to help prevent HIV infection. If you choose to take medicine to prevent HIV, you should first get tested for HIV. You should then be tested every 3 months for as long as you are taking the medicine. Follow these instructions at home: Alcohol use Do not drink alcohol if your health care provider tells you not to drink. If you drink alcohol: Limit how much you have to 0-2 drinks a day. Know how much alcohol is in your drink. In the [place] one drink equals one 12 oz bottle of beer (355 mL), one 5 oz glass of wine (148 mL), or one 1 oz glass of hard liquor (44 mL). Lifestyle Do not use any products that contain nicotine or tobacco. These products include cigarettes, chewing tobacco, and vaping devices, such as e-cigarettes. If you need help quitting, ask your health care provider. Do not use street drugs. Do not share needles. Ask your health care provider for help if you need support or information about quitting drugs. General instructions Schedule regular health, dental, and eye exams. Stay current with your vaccines. Tell your health care provider if: You often feel depressed. You have ever been abused or do not feel safe at home. Summary Adopting a healthy lifestyle and getting preventive care are important in promoting health and wellness. Follow your health care provider's instructions about healthy diet, exercising, and getting tested or screened for diseases. Follow your health care provider's instructions on monitoring your cholesterol and blood pressure. This information is not intended to replace advice given to you by your health care provider. Make sure you discuss any questions you have with your health care provider. Document Revised: 01/25/2021 Document Reviewed: 01/25/2021 Elsevier Patient Education  2023 [employer].  

## 2022-01-31 NOTE — Progress Notes (Signed)
? ?Complete physical exam ? ?Patient: Elijah Ford   DOB: July 14, 1976   45 y.o. Male  MRN: [id number] ? ?Subjective:  ?  ?Chief Complaint  ?Patient presents with  ? Annual Exam  ? ? ?Elijah Ford is a 46 y.o. male who presents today for a complete physical exam. He reports consuming a general diet. Gym/ health club routine includes cardio and mod to heavy weightlifting. He generally feels well. He reports sleeping well. He does not have additional problems to discuss today.  ? ? ?Most recent fall risk assessment: ? ?  01/31/2022  ?  2:02 PM  ?Fall Risk   ?Falls in the past year? 0  ?Number falls in past yr: 0  ?Injury with Fall? 0  ? ?  ?Most recent depression screenings: ? ?  01/31/2022  ?  2:02 PM 11/01/2021  ?  9:49 AM  ?PHQ 2/9 Scores  ?PHQ - 2 Score 1 0  ? ? ?Vision:Within last year and Dental: No current dental problems and Receives regular dental care ? ?Patient Active Problem List  ? Diagnosis Date Noted  ? Impingement syndrome of both shoulders 09/03/2021  ? Type 2 diabetes mellitus with hyperglycemia ([place]) 11/17/2020  ? Bell's palsy 11/16/2020  ? Subcutaneous mass of head 10/16/2019  ? Lesion of penis 10/16/2019  ? Moderate episode of recurrent major depressive disorder ([place]) 04/08/2019  ? Stress due to illness of family member 12/07/2018  ? Acute stress reaction 12/07/2018  ? Stressful life event affecting family 10/30/2018  ? Seasonal and perennial allergic rhinitis 07/12/2018  ? Allergic conjunctivitis 07/12/2018  ? Oral allergy syndrome 07/12/2018  ? Seasonal allergies 06/13/2018  ? Hyperlipidemia LDL goal <70 07/18/2017  ? Vitamin D deficiency 07/18/2017  ? Elevated serum creatinine 07/18/2017  ? Low serum vitamin B12 07/18/2017  ? Bilateral metatarsal stress injury 05/09/2017  ? Plantar fascial fibromatosis of left foot 12/14/2016  ? Ventral hernia without obstruction or gangrene 06/01/2016  ? Elevated hematocrit 04/06/2016  ? Plantar fasciitis, left 03/09/2016  ? Left ear pain 12/02/2015  ? Male  hypogonadism 07/06/2015  ? Eosinophilia 06/26/2015  ? ED (erectile dysfunction) 04/10/2015  ? Epididymal cyst 04/10/2015  ? Incomplete emptying of bladder 04/10/2015  ? Moderate persistent asthma 11/26/2014  ? OSA (obstructive sleep apnea) 07/17/2014  ? S/P UPPP (uvulopalatopharyngoplasty) 07/17/2014  ? Prediabetes 07/09/2014  ? Gout 07/09/2014  ? Metatarsal stress fracture of left foot 07/08/2014  ? Bilateral extensor hallucis longus tendinitis 06/27/2014  ? Tarsometatarsal osteoarthritis of both feet 06/20/2014  ? Essential hypertension, benign 11/04/2013  ? Anxiety 11/04/2013  ? Depression 11/04/2013  ? Class 1 obesity due to excess calories with serious comorbidity and body mass index (BMI) of 33.0 to 33.9 in adult 11/04/2013  ? GERD (gastroesophageal reflux disease) 11/04/2013  ? ?Past Medical History:  ?Diagnosis Date  ? Anxiety   ? Arthritis   ? both feet, soreness in multiple areas, plantar fascitis, uses orthodics in both shoes   ? Asthma   ? Depression   ? counselling in place, sporadically   ? GERD (gastroesophageal reflux disease)   ? Hyperlipidemia   ? Hypertension   ? was in the past   ? Pneumonia 2015  ? Seasonal allergies   ? Sleep apnea   ? CPAP q night   ? Type 2 diabetes mellitus with hyperglycemia ([place]) 11/17/2020  ? ?Family History  ?Problem Relation Age of Onset  ? Depression Mother   ? Cancer Father   ? Alcohol  abuse Brother   ? Depression Brother   ? Alcohol abuse Brother   ? Depression Brother   ? Allergic rhinitis Neg Hx   ? Angioedema Neg Hx   ? Asthma Neg Hx   ? Eczema Neg Hx   ? Immunodeficiency Neg Hx   ? Urticaria Neg Hx   ? ?Allergies  ?Allergen Reactions  ? Other Itching  ?  MELONS  ? ?  ? ?Patient Care Team: ?Lavada Mesi as PCP - General (Family Medicine)  ? ?Outpatient Medications Prior to Visit  ?Medication Sig  ? albuterol (VENTOLIN HFA) 108 (90 Base) MCG/ACT inhaler Inhale 2 puffs into the lungs every 4 (four) hours as needed for wheezing or shortness of breath.  ?  allopurinol (ZYLOPRIM) 300 MG tablet Take 1 tablet (300 mg total) by mouth daily.  ? AMBULATORY NON FORMULARY MEDICATION Continuous positive airway pressure (CPAP) machine set at 11 cm of H2O pressure, with all supplemental supplies as needed.  ? B-D 3CC LUER-LOK SYR 18GX1-1/2 18G X 1-1/2" 3 ML MISC USE AS DIRECTED TO DRAW TESTOSTERONE FROM VIAL EVERY 14 DAYS  ? budesonide-formoterol (SYMBICORT) 160-4.5 MCG/ACT inhaler Inhale 2 puffs into the lungs 2 (two) times daily.  ? buPROPion (WELLBUTRIN XL) 300 MG 24 hr tablet Take 1 tablet (300 mg total) by mouth daily.  ? busPIRone (BUSPAR) 5 MG tablet TAKE 1 TABLET THREE TIMES A DAY  ? EPINEPHrine (AUVI-Q) 0.3 mg/0.3 mL IJ SOAJ injection Use as directed for severe allergic reaction.  ? FLUoxetine (PROZAC) 20 MG capsule TAKE 4 CAPSULES (80 MG TOTAL) BY MOUTH DAILY.  ? hydrOXYzine (VISTARIL) 50 MG capsule Take 1 capsule (50 mg total) by mouth 3 (three) times daily as needed. Reported on 10/07/2015  ? ipratropium (ATROVENT) 0.06 % nasal spray Place 2 sprays into both nostrils 4 (four) times daily.  ? meloxicam (MOBIC) 15 MG tablet One tab PO qAM with a meal for 2 weeks, then daily prn pain.  ? montelukast (SINGULAIR) 10 MG tablet Take 1 tablet (10 mg total) by mouth at bedtime.  ? NEEDLE, DISP, 21 G (BD ECLIPSE NEEDLE) 21G X 1-1/2" MISC 1 Device by Does not apply route every 14 (fourteen) days.  ? NEEDLE, DISP, 22 G (BD ECLIPSE NEEDLE) 22G X 1-1/2" MISC Use to inject testosterone every 14 days  ? omeprazole (PRILOSEC) 40 MG capsule Take 1 capsule (40 mg total) by mouth daily.  ? Syringe, Disposable, (B-D SYRINGE LUER-LOK 3CC) 3 ML MISC Use to inject testosterone every 14 days  ? testosterone cypionate (DEPOTESTOSTERONE CYPIONATE) 200 MG/ML injection INJECT 1 ML (200 MG TOTAL) INTO THE MUSCLE EVERY 14 (FOURTEEN) DAYS. PLEASE INCLUDE SYRINGES AND 22G 1.5" NEEDLES  ? Vardenafil HCl 10 MG TBDP Take 1 tablet by mouth as needed. 1 hour before intercourse  ? [DISCONTINUED]  atorvastatin (LIPITOR) 20 MG tablet Take 1 tablet (20 mg total) by mouth daily.  ? [DISCONTINUED] Semaglutide,0.25 or 0.'5MG'$ /DOS, (OZEMPIC, 0.25 OR 0.5 MG/DOSE,) 2 MG/1.5ML SOPN Inject 0.25 mg into the skin once a week.  ? ?No facility-administered medications prior to visit.  ? ? ?ROS ? ? ? ? ?   ?Objective:  ? ?  ?BP 121/78   Pulse 78   Ht '6\' 8"'$  (2.032 m)   Wt (!) 305 lb (138.3 kg)   SpO2 99%   BMI 33.51 kg/m?  ?BP Readings from Last 3 Encounters:  ?01/31/22 121/78  ?11/01/21 136/86  ?11/16/20 (!) 144/92  ? ?Wt Readings from Last 3 Encounters:  ?  01/31/22 (!) 305 lb (138.3 kg)  ?11/01/21 296 lb (134.3 kg)  ?11/16/20 (!) 343 lb (155.6 kg)  ? ?  ? ?Physical Exam  ?BP 121/78   Pulse 78   Ht '6\' 8"'$  (2.032 m)   Wt (!) 305 lb (138.3 kg)   SpO2 99%   BMI 33.51 kg/m?  ? ?General Appearance:    Alert, cooperative, no distress, appears stated age  ?Head:    Normocephalic, without obvious abnormality, atraumatic  ?Eyes:    PERRL, conjunctiva/corneas clear, EOM's intact, fundi  ?  benign, both eyes       ?Ears:    Normal TM's and external ear canals, both ears  ?Nose:   Nares normal, septum midline, mucosa normal, no drainage    or sinus tenderness  ?Throat:   Lips, mucosa, and tongue normal; teeth and gums normal  ?Neck:   Supple, symmetrical, trachea midline, no adenopathy;     ?  thyroid:  No enlargement/tenderness/nodules; no carotid ?  bruit or JVD  ?Back:     Symmetric, no curvature, ROM normal, no CVA tenderness  ?Lungs:     Clear to auscultation bilaterally, respirations unlabored  ?Chest wall:    No tenderness or deformity  ?Heart:    Regular rate and rhythm, S1 and S2 normal, no murmur, rub   or gallop  ?Abdomen:     Soft, non-tender, bowel sounds active all four quadrants,  ?  no masses, no organomegaly  ?   ?   ?Extremities:   Extremities normal, atraumatic, no cyanosis or edema  ?Pulses:   2+ and symmetric all extremities  ?Skin:   Skin color, texture, turgor normal, no rashes or lesions  ?Lymph nodes:    Cervical, supraclavicular, and axillary nodes normal  ?Neurologic:   CNII-XII intact. Normal strength, sensation and reflexes    ?  throughout  ?Marland Kitchen. ? ?  01/31/2022  ?  2:02 PM 11/01/2021  ?  9:49 AM 11/16/2020  ?  9:15 AM 08/20/2019  ?

## 2022-02-01 ENCOUNTER — Other Ambulatory Visit: Payer: Self-pay | Admitting: Physician Assistant

## 2022-02-01 ENCOUNTER — Encounter: Payer: Self-pay | Admitting: Physician Assistant

## 2022-02-01 LAB — COMPLETE METABOLIC PANEL WITH GFR
AG Ratio: 1.9 (calc) (ref 1.0–2.5)
ALT: 24 U/L (ref 9–46)
AST: 21 U/L (ref 10–40)
Albumin: 4.7 g/dL (ref 3.6–5.1)
Alkaline phosphatase (APISO): 54 U/L (ref 36–130)
BUN/Creatinine Ratio: 9 (calc) (ref 6–22)
BUN: 12 mg/dL (ref 7–25)
CO2: 27 mmol/L (ref 20–32)
Calcium: 9.8 mg/dL (ref 8.6–10.3)
Chloride: 103 mmol/L (ref 98–110)
Creat: 1.36 mg/dL — ABNORMAL HIGH (ref 0.60–1.29)
Globulin: 2.5 g/dL (calc) (ref 1.9–3.7)
Glucose, Bld: 98 mg/dL (ref 65–99)
Potassium: 4.3 mmol/L (ref 3.5–5.3)
Sodium: 139 mmol/L (ref 135–146)
Total Bilirubin: 0.8 mg/dL (ref 0.2–1.2)
Total Protein: 7.2 g/dL (ref 6.1–8.1)
eGFR: 65 mL/min/{1.73_m2} (ref >=60)

## 2022-02-01 LAB — HEMOGLOBIN A1C
Hgb A1c MFr Bld: 6.1 % of total Hgb — ABNORMAL HIGH (ref <5.7)
Mean Plasma Glucose: 128 mg/dL
eAG (mmol/L): 7.1 mmol/L

## 2022-02-01 LAB — LIPID PANEL W/REFLEX DIRECT LDL
Cholesterol: 231 mg/dL — ABNORMAL HIGH (ref <200)
HDL: 55 mg/dL (ref >=40)
LDL Cholesterol (Calc): 146 mg/dL (calc) — ABNORMAL HIGH
Non-HDL Cholesterol (Calc): 176 mg/dL (calc) — ABNORMAL HIGH (ref <130)
Total CHOL/HDL Ratio: 4.2 (calc) (ref <5.0)
Triglycerides: 166 mg/dL — ABNORMAL HIGH (ref <150)

## 2022-02-01 LAB — VITAMIN D 25 HYDROXY (VIT D DEFICIENCY, FRACTURES): Vit D, 25-Hydroxy: 31 ng/mL (ref 30–100)

## 2022-02-01 MED ORDER — ATORVASTATIN CALCIUM 40 MG PO TABS: 40.0000 mg | ORAL_TABLET | Freq: Every day | ORAL | 1 refills | Status: DC

## 2022-02-01 NOTE — Progress Notes (Signed)
Vitamin D better than 3 months ago. How much are you taking(not on med list)?  ? ?A1C is up some. Ozempic was increased at appt. Continue to avoid sugars/carbs.  ? ?Kidney function improved since last visit.  ? ?Cholesterol better but not to goal of LDL under 70. Increase lipitor '40mg'$  and recheck in 6 months.  ?

## 2022-02-02 ENCOUNTER — Encounter: Payer: Self-pay | Admitting: Physician Assistant

## 2022-02-02 ENCOUNTER — Other Ambulatory Visit: Payer: Self-pay

## 2022-02-02 MED ORDER — VITAMIN D3 50 MCG (2000 UT) PO CAPS: 2000.0000 [IU] | ORAL_CAPSULE | Freq: Every day | ORAL | 3 refills | Status: AC

## 2022-02-21 ENCOUNTER — Other Ambulatory Visit: Payer: Self-pay | Admitting: Physician Assistant

## 2022-02-21 DIAGNOSIS — F419 Anxiety disorder, unspecified: Secondary | ICD-10-CM

## 2022-02-21 DIAGNOSIS — F430 Acute stress reaction: Secondary | ICD-10-CM

## 2022-03-12 ENCOUNTER — Other Ambulatory Visit: Payer: Self-pay | Admitting: Physician Assistant

## 2022-03-12 DIAGNOSIS — F331 Major depressive disorder, recurrent, moderate: Secondary | ICD-10-CM

## 2022-03-14 ENCOUNTER — Other Ambulatory Visit: Payer: Self-pay | Admitting: Neurology

## 2022-03-14 MED ORDER — ONDANSETRON HCL 8 MG PO TABS: 8.0000 mg | ORAL_TABLET | Freq: Three times a day (TID) | ORAL | 0 refills | Status: DC | PRN

## 2022-03-21 DIAGNOSIS — Z1211 Encounter for screening for malignant neoplasm of colon: Secondary | ICD-10-CM | POA: Diagnosis not present

## 2022-03-21 DIAGNOSIS — K631 Perforation of intestine (nontraumatic): Secondary | ICD-10-CM | POA: Diagnosis not present

## 2022-03-21 DIAGNOSIS — G4733 Obstructive sleep apnea (adult) (pediatric): Secondary | ICD-10-CM | POA: Diagnosis not present

## 2022-03-21 DIAGNOSIS — K828 Other specified diseases of gallbladder: Secondary | ICD-10-CM | POA: Diagnosis not present

## 2022-03-21 DIAGNOSIS — N179 Acute kidney failure, unspecified: Secondary | ICD-10-CM | POA: Diagnosis not present

## 2022-03-21 DIAGNOSIS — Z6833 Body mass index (BMI) 33.0-33.9, adult: Secondary | ICD-10-CM | POA: Diagnosis not present

## 2022-03-21 DIAGNOSIS — K219 Gastro-esophageal reflux disease without esophagitis: Secondary | ICD-10-CM | POA: Diagnosis not present

## 2022-03-21 DIAGNOSIS — I10 Essential (primary) hypertension: Secondary | ICD-10-CM | POA: Diagnosis not present

## 2022-03-21 DIAGNOSIS — Z932 Ileostomy status: Secondary | ICD-10-CM | POA: Diagnosis not present

## 2022-03-21 DIAGNOSIS — J45909 Unspecified asthma, uncomplicated: Secondary | ICD-10-CM | POA: Diagnosis not present

## 2022-03-21 DIAGNOSIS — E785 Hyperlipidemia, unspecified: Secondary | ICD-10-CM | POA: Diagnosis not present

## 2022-03-21 DIAGNOSIS — K6819 Other retroperitoneal abscess: Secondary | ICD-10-CM | POA: Diagnosis not present

## 2022-03-21 DIAGNOSIS — D72829 Elevated white blood cell count, unspecified: Secondary | ICD-10-CM | POA: Diagnosis not present

## 2022-03-21 DIAGNOSIS — R861 Abnormal level of hormones in specimens from male genital organs: Secondary | ICD-10-CM | POA: Diagnosis not present

## 2022-03-21 DIAGNOSIS — E669 Obesity, unspecified: Secondary | ICD-10-CM | POA: Diagnosis not present

## 2022-03-21 DIAGNOSIS — Z8719 Personal history of other diseases of the digestive system: Secondary | ICD-10-CM | POA: Diagnosis not present

## 2022-03-21 DIAGNOSIS — R1084 Generalized abdominal pain: Secondary | ICD-10-CM | POA: Diagnosis not present

## 2022-03-21 DIAGNOSIS — K6389 Other specified diseases of intestine: Secondary | ICD-10-CM | POA: Diagnosis not present

## 2022-03-21 DIAGNOSIS — R638 Other symptoms and signs concerning food and fluid intake: Secondary | ICD-10-CM | POA: Diagnosis not present

## 2022-03-21 DIAGNOSIS — D122 Benign neoplasm of ascending colon: Secondary | ICD-10-CM | POA: Diagnosis not present

## 2022-03-21 DIAGNOSIS — R112 Nausea with vomiting, unspecified: Secondary | ICD-10-CM | POA: Diagnosis not present

## 2022-03-21 DIAGNOSIS — D120 Benign neoplasm of cecum: Secondary | ICD-10-CM | POA: Diagnosis not present

## 2022-03-21 DIAGNOSIS — E7800 Pure hypercholesterolemia, unspecified: Secondary | ICD-10-CM | POA: Diagnosis not present

## 2022-03-21 DIAGNOSIS — E119 Type 2 diabetes mellitus without complications: Secondary | ICD-10-CM | POA: Diagnosis not present

## 2022-03-21 DIAGNOSIS — K9172 Accidental puncture and laceration of a digestive system organ or structure during other procedure: Secondary | ICD-10-CM | POA: Diagnosis not present

## 2022-03-21 DIAGNOSIS — K9171 Accidental puncture and laceration of a digestive system organ or structure during a digestive system procedure: Secondary | ICD-10-CM | POA: Diagnosis not present

## 2022-03-21 DIAGNOSIS — R143 Flatulence: Secondary | ICD-10-CM | POA: Diagnosis not present

## 2022-03-21 DIAGNOSIS — E870 Hyperosmolality and hypernatremia: Secondary | ICD-10-CM | POA: Diagnosis not present

## 2022-03-21 DIAGNOSIS — K5730 Diverticulosis of large intestine without perforation or abscess without bleeding: Secondary | ICD-10-CM | POA: Diagnosis not present

## 2022-03-21 DIAGNOSIS — K91840 Postprocedural hemorrhage and hematoma of a digestive system organ or structure following a digestive system procedure: Secondary | ICD-10-CM | POA: Diagnosis not present

## 2022-03-21 DIAGNOSIS — K760 Fatty (change of) liver, not elsewhere classified: Secondary | ICD-10-CM | POA: Diagnosis not present

## 2022-03-21 DIAGNOSIS — R1031 Right lower quadrant pain: Secondary | ICD-10-CM | POA: Diagnosis not present

## 2022-03-21 LAB — HM COLONOSCOPY

## 2022-04-05 DIAGNOSIS — Z4802 Encounter for removal of sutures: Secondary | ICD-10-CM | POA: Diagnosis not present

## 2022-04-05 DIAGNOSIS — Z09 Encounter for follow-up examination after completed treatment for conditions other than malignant neoplasm: Secondary | ICD-10-CM | POA: Diagnosis not present

## 2022-04-05 DIAGNOSIS — Z5189 Encounter for other specified aftercare: Secondary | ICD-10-CM | POA: Diagnosis not present

## 2022-04-06 ENCOUNTER — Ambulatory Visit: Payer: BC Managed Care – PPO | Admitting: Physician Assistant

## 2022-04-06 VITALS — BP 112/66 | HR 80 | Ht >= 80 in | Wt 283.0 lb

## 2022-04-06 DIAGNOSIS — K631 Perforation of intestine (nontraumatic): Secondary | ICD-10-CM | POA: Diagnosis not present

## 2022-04-06 DIAGNOSIS — D126 Benign neoplasm of colon, unspecified: Secondary | ICD-10-CM | POA: Diagnosis not present

## 2022-04-06 DIAGNOSIS — E1165 Type 2 diabetes mellitus with hyperglycemia: Secondary | ICD-10-CM

## 2022-04-06 NOTE — Progress Notes (Addendum)
Acute Office Visit  Subjective:     Patient ID: Elijah Ford, male    DOB: 04-Jul-1976, 46 y.o.   MRN: [id number]  Chief Complaint  Patient presents with   Hospitalization Follow-up    S/p surgery    HPI Patient is in today for hospital follow up after colonoscopy and with complication of perforation and then emergency colectomy on 7/3.  Dr. Cindee Salt was gastroenterologist. He did have 2 pre-cancerous polyps removed.   His stools are soft and regular without melena or hematochezia. His diet is regular. His abdomen is tender and he is still weak but improving. He should go back to work August 22nd.   He did stop ozempic. He wonders when to restart. He is not checking sugars.   He is using CPAP nightly with great benefit. He does feel like he may need a new machine or something adjusted because not working as well and making loud noises.    .. Active Ambulatory Problems    Diagnosis Date Noted   Essential hypertension, benign 11/04/2013   Anxiety 11/04/2013   Depression 11/04/2013   Class 1 obesity due to excess calories with serious comorbidity and body mass index (BMI) of 33.0 to 33.9 in adult 11/04/2013   GERD (gastroesophageal reflux disease) 11/04/2013   Tarsometatarsal osteoarthritis of both feet 06/20/2014   Bilateral extensor hallucis longus tendinitis 06/27/2014   Metatarsal stress fracture of left foot 07/08/2014   Prediabetes 07/09/2014   Gout 07/09/2014   Moderate persistent asthma 11/26/2014   Eosinophilia 06/26/2015   Male hypogonadism 07/06/2015   Left ear pain 12/02/2015   Plantar fasciitis, left 03/09/2016   Elevated hematocrit 04/06/2016   Ventral hernia without obstruction or gangrene 06/01/2016   Plantar fascial fibromatosis of left foot 12/14/2016   Bilateral metatarsal stress injury 05/09/2017   OSA (obstructive sleep apnea) 07/17/2014   Hyperlipidemia LDL goal <70 07/18/2017   Vitamin D deficiency 07/18/2017   Elevated serum creatinine 07/18/2017    Low serum vitamin B12 07/18/2017   Seasonal allergies 06/13/2018   ED (erectile dysfunction) 04/10/2015   Epididymal cyst 04/10/2015   Incomplete emptying of bladder 04/10/2015   S/P UPPP (uvulopalatopharyngoplasty) 07/17/2014   Seasonal and perennial allergic rhinitis 07/12/2018   Allergic conjunctivitis 07/12/2018   Oral allergy syndrome 07/12/2018   Stressful life event affecting family 10/30/2018   Stress due to illness of family member 12/07/2018   Acute stress reaction 12/07/2018   Moderate episode of recurrent major depressive disorder ([place]) 04/08/2019   Subcutaneous mass of head 10/16/2019   Lesion of penis 10/16/2019   Bell's palsy 11/16/2020   Type 2 diabetes mellitus with hyperglycemia ([place]) 11/17/2020   Impingement syndrome of both shoulders 09/03/2021   Perforation of colon ([place]) 04/06/2022   Adenomatous polyp of colon 04/08/2022   Resolved Ambulatory Problems    Diagnosis Date Noted   Tinea pedis 06/27/2014   Acute bronchitis 06/27/2014   CAP (community acquired pneumonia) 09/18/2014   Pneumonia 09/18/2014   Bronchitis, asthmatic 10/15/2014   Asthma with acute exacerbation 11/26/2014   Skin lesion of right lower extremity 03/13/2015   Past Medical History:  Diagnosis Date   Arthritis    Asthma    Hyperlipidemia    Hypertension    Sleep apnea      ROS See HPI.      Objective:    BP 112/66   Pulse 80   Ht '6\' 8"'$  (2.032 m)   Wt 283 lb (128.4 kg)   SpO2 96%  BMI 31.09 kg/m  BP Readings from Last 3 Encounters:  04/06/22 112/66  01/31/22 121/78  11/01/21 136/86   Wt Readings from Last 3 Encounters:  04/06/22 283 lb (128.4 kg)  01/31/22 (!) 305 lb (138.3 kg)  11/01/21 296 lb (134.3 kg)      Physical Exam Constitutional:      Appearance: Normal appearance. He is obese.  HENT:     Head: Normocephalic.  Cardiovascular:     Rate and Rhythm: Normal rate.  Pulmonary:     Effort: Pulmonary effort is normal.  Abdominal:     General: There is  no distension.     Palpations: Abdomen is soft. There is no mass.     Tenderness: There is abdominal tenderness. There is no right CVA tenderness, left CVA tenderness, guarding or rebound.     Comments: Generalized tenderness over incision area.   Musculoskeletal:     Right lower leg: No edema.     Left lower leg: No edema.  Neurological:     General: No focal deficit present.     Mental Status: He is alert and oriented to person, place, and time.  Psychiatric:        Mood and Affect: Mood normal.           Assessment & Plan:  .Elijah Ford was seen today for hospitalization follow-up.  Diagnoses and all orders for this visit:  Perforation of colon ([place])  Type 2 diabetes mellitus with hyperglycemia, unspecified whether long term insulin use ([facility])  Adenomatous polyp of colon, unspecified part of colon  Pt seems to be doing overall well.  Bowel movements back to normal with no blood. His abdominal incisions are healing.  He just followed up with GI.  Glucose stayed great in hospital and off ozempic. Concerned about re-starting too soon due to constipation risk Continue to check morning sugars to stay under 130.  Hold off on start until at least 4 weeks post-op and then start .'25mg'$  dose.  Gave sample today. Follow up 3 months with A1C.  Needs new CPAP machine due to noises and feeling like not working as well. Order placed.   Elijah Planas, PA-C

## 2022-04-06 NOTE — Patient Instructions (Signed)
Stop ozempic for 4 weeks post op then start back .'5mg'$  weekly then '1mg'$  weekly.

## 2022-04-08 ENCOUNTER — Encounter: Payer: Self-pay | Admitting: Physician Assistant

## 2022-04-08 DIAGNOSIS — D126 Benign neoplasm of colon, unspecified: Secondary | ICD-10-CM | POA: Insufficient documentation

## 2022-04-10 ENCOUNTER — Other Ambulatory Visit: Payer: Self-pay | Admitting: Physician Assistant

## 2022-04-11 NOTE — Telephone Encounter (Signed)
Last written 10/11/2021 - 6 ml with one refill Injecting 1 ml every two weeks Last appt 04/06/2022

## 2022-04-28 ENCOUNTER — Encounter: Payer: Self-pay | Admitting: Physician Assistant

## 2022-05-02 ENCOUNTER — Telehealth: Payer: Self-pay | Admitting: Physician Assistant

## 2022-05-02 NOTE — Telephone Encounter (Signed)
Elijah Ford    I received a fax stating Elijah Ford needed to schedule a Follow up appointment with you for his CPAP I messaged Elijah Ford to have him call me to schedule and she had asked if you were able to order his new machine yet due to his being broken.    Please advise Elijah Ford

## 2022-05-02 NOTE — Telephone Encounter (Signed)
I don't think machine is old enough for new one unless it is broken, in that case he will probably need to contact the company about getting a replacement.

## 2022-05-03 MED ORDER — AMBULATORY NON FORMULARY MEDICATION: 0 refills | Status: AC

## 2022-05-03 NOTE — Telephone Encounter (Signed)
Messaged [facility] and let them know RX in Epic.

## 2022-05-03 NOTE — Telephone Encounter (Signed)
I sent Elijah Ford a message and let her know they can reach out to the company for a replacement. Thank you for your help with this.

## 2022-05-03 NOTE — Telephone Encounter (Signed)
Message sent to [facility] letting them know order in Epic.

## 2022-05-05 ENCOUNTER — Other Ambulatory Visit: Payer: Self-pay | Admitting: Neurology

## 2022-05-05 DIAGNOSIS — N521 Erectile dysfunction due to diseases classified elsewhere: Secondary | ICD-10-CM

## 2022-05-05 MED ORDER — VARDENAFIL HCL 10 MG PO TBDP: 1.0000 | ORAL_TABLET | ORAL | 1 refills | Status: DC | PRN

## 2022-05-12 ENCOUNTER — Other Ambulatory Visit: Payer: Self-pay | Admitting: Physician Assistant

## 2022-05-12 DIAGNOSIS — M109 Gout, unspecified: Secondary | ICD-10-CM

## 2022-05-24 ENCOUNTER — Encounter: Payer: Self-pay | Admitting: Neurology

## 2022-05-24 NOTE — Telephone Encounter (Signed)
Messages with [facility]:   RE: CPAP Received: 2 days ago Gonzella Lex sent to [place], [place] he received a loaner on 04-29-22 in the [place] office. He was issued his current machine on 04-18-18.   We rent the loaner to the patient for $40.00 monthly for 10 months then the patient owns it.   He is eligible for a new machine 04-19-2023 through his insurance.   Warranties on these new machines are 2 years.  His warranty expired 04-18-20        Previous Messages    ----- Message -----  From: Annamaria Helling, CMA  Sent: 05/16/2022   8:15 AM EDT  To: Orrin Brigham Hepler  Subject: RE: CPAP                                       His machine is having issues, is there a way to get his fixed or something? See previous message. He was given a Designer[employer] for now but what is the long term plan for patients who have issues with machines from your company?  ----- Message -----  From: Gonzella Lex  Sent: 05/10/2022   2:03 PM EDT  To: Annamaria Helling, CMA  Subject: RE: CPAP                                       He is not eligible for a new machine until 04-19-2023. If he wants to he can purchase one out of pocket.    ----- Message -----  From: Marchelle Gearing  Sent: 05/05/2022  11:59 AM EDT  To: Vanessa Ralphs; Melanie Renee Hepler; *  Subject: FW: CPAP                                       Helix Lafontaine I am no longer processing for [facility]. Please in the future send to [facility] Alert please process this.   ----- Message -----  From: Annamaria Helling, CMA  Sent: 05/03/2022  11:06 AM EDT  To: Vanessa Ralphs; Marchelle Gearing  Subject: CPAP                                           Hi,   Patient is having issues with a broken CPAP. States he was given a Materials engineer but needs a new order for CPAP. Order in epic. Please let us know if you need something else. Thanks!   Dewitt Hoes

## 2022-06-27 ENCOUNTER — Other Ambulatory Visit: Payer: Self-pay | Admitting: Physician Assistant

## 2022-06-27 DIAGNOSIS — F331 Major depressive disorder, recurrent, moderate: Secondary | ICD-10-CM

## 2022-07-03 ENCOUNTER — Other Ambulatory Visit: Payer: Self-pay | Admitting: Physician Assistant

## 2022-07-03 DIAGNOSIS — M109 Gout, unspecified: Secondary | ICD-10-CM

## 2022-07-03 DIAGNOSIS — K219 Gastro-esophageal reflux disease without esophagitis: Secondary | ICD-10-CM

## 2022-08-05 ENCOUNTER — Other Ambulatory Visit: Payer: Self-pay | Admitting: Physician Assistant

## 2022-08-05 MED ORDER — RYBELSUS 7 MG PO TABS: 7.0000 mg | ORAL_TABLET | Freq: Every day | ORAL | 0 refills | Status: DC

## 2022-08-05 NOTE — Progress Notes (Signed)
Not been able to get ozempic weekly injection. Switched to oral semaglutide.

## 2022-08-10 ENCOUNTER — Telehealth: Payer: Self-pay

## 2022-08-10 NOTE — Telephone Encounter (Addendum)
Initiated Prior authorization [id number] '7MG'$  tablets Via: Covermymeds Case/Key:[id number] Status: approved  as of 08/10/22 Reason:Effective from 08/10/2022 through 08/09/2023. Notified Pt via: Mychart

## 2022-09-26 ENCOUNTER — Other Ambulatory Visit: Payer: Self-pay | Admitting: Physician Assistant

## 2022-09-26 DIAGNOSIS — Z20828 Contact with and (suspected) exposure to other viral communicable diseases: Secondary | ICD-10-CM

## 2022-09-26 DIAGNOSIS — R6889 Other general symptoms and signs: Secondary | ICD-10-CM

## 2022-09-26 MED ORDER — OSELTAMIVIR PHOSPHATE 75 MG PO CAPS: 75.0000 mg | ORAL_CAPSULE | Freq: Two times a day (BID) | ORAL | 0 refills | Status: DC

## 2022-09-26 NOTE — Progress Notes (Signed)
Exposure to flu with flu like symptoms. Sent tamiflu.

## 2022-10-14 ENCOUNTER — Encounter: Payer: Self-pay | Admitting: Physician Assistant

## 2022-10-14 ENCOUNTER — Other Ambulatory Visit (HOSPITAL_BASED_OUTPATIENT_CLINIC_OR_DEPARTMENT_OTHER): Payer: Self-pay

## 2022-10-14 DIAGNOSIS — E1165 Type 2 diabetes mellitus with hyperglycemia: Secondary | ICD-10-CM

## 2022-10-14 MED ORDER — SEMAGLUTIDE (2 MG/DOSE) 8 MG/3ML ~~LOC~~ SOPN: 1.0000 mg | PEN_INJECTOR | SUBCUTANEOUS | 1 refills | Status: DC

## 2022-10-14 MED ORDER — SEMAGLUTIDE (2 MG/DOSE) 8 MG/3ML ~~LOC~~ SOPN
1.0000 mg | PEN_INJECTOR | SUBCUTANEOUS | 1 refills | Status: DC
  Filled 2022-10-14 (×2): qty 9, 84d supply, fill #0

## 2022-10-14 MED ORDER — SEMAGLUTIDE (2 MG/DOSE) 8 MG/3ML ~~LOC~~ SOPN
1.0000 mg | PEN_INJECTOR | SUBCUTANEOUS | 1 refills | Status: DC
  Filled 2022-10-14 – 2022-11-01 (×3): qty 9, 84d supply, fill #0

## 2022-10-14 NOTE — Addendum Note (Signed)
Addended by: Narda Rutherford on: 10/14/2022 02:11 PM   Modules accepted: Orders

## 2022-10-14 NOTE — Addendum Note (Signed)
Addended by: Donella Stade on: 10/14/2022 01:51 PM   Modules accepted: Orders

## 2022-10-17 ENCOUNTER — Other Ambulatory Visit (HOSPITAL_BASED_OUTPATIENT_CLINIC_OR_DEPARTMENT_OTHER): Payer: Self-pay

## 2022-10-18 ENCOUNTER — Other Ambulatory Visit (HOSPITAL_BASED_OUTPATIENT_CLINIC_OR_DEPARTMENT_OTHER): Payer: Self-pay

## 2022-10-20 ENCOUNTER — Other Ambulatory Visit (HOSPITAL_BASED_OUTPATIENT_CLINIC_OR_DEPARTMENT_OTHER): Payer: Self-pay

## 2022-10-24 ENCOUNTER — Other Ambulatory Visit (HOSPITAL_BASED_OUTPATIENT_CLINIC_OR_DEPARTMENT_OTHER): Payer: Self-pay

## 2022-10-25 ENCOUNTER — Other Ambulatory Visit (HOSPITAL_BASED_OUTPATIENT_CLINIC_OR_DEPARTMENT_OTHER): Payer: Self-pay

## 2022-10-25 ENCOUNTER — Telehealth: Payer: Self-pay

## 2022-10-25 NOTE — Telephone Encounter (Addendum)
Initiated Prior authorization [id number] (2 MG/DOSE) 8MG/3ML pen-injectors Via: Covermymeds Case/Key: Status: approved  as of 10/25/22 Reason:Effective from 10/25/2022 through 10/24/2023.    Notified Pt via: Mychart

## 2022-10-27 ENCOUNTER — Encounter: Payer: Self-pay | Admitting: Physician Assistant

## 2022-10-27 ENCOUNTER — Other Ambulatory Visit (HOSPITAL_BASED_OUTPATIENT_CLINIC_OR_DEPARTMENT_OTHER): Payer: Self-pay

## 2022-10-28 MED ORDER — NIRMATRELVIR/RITONAVIR (PAXLOVID)TABLET: 3.0000 | ORAL_TABLET | Freq: Two times a day (BID) | ORAL | 0 refills | Status: AC

## 2022-11-01 ENCOUNTER — Other Ambulatory Visit (HOSPITAL_BASED_OUTPATIENT_CLINIC_OR_DEPARTMENT_OTHER): Payer: Self-pay

## 2022-11-03 ENCOUNTER — Other Ambulatory Visit (HOSPITAL_BASED_OUTPATIENT_CLINIC_OR_DEPARTMENT_OTHER): Payer: Self-pay

## 2022-11-03 ENCOUNTER — Telehealth: Payer: Self-pay | Admitting: Neurology

## 2022-11-03 NOTE — Telephone Encounter (Signed)
Elijah Ford - received the following message from pharmacy:  Good morning, The Rx recently sent over for this pt's Ozempic is 36m pens with 137mdirections. Could you resend with the 72m54mirections? I see in the notes that 72mg37m the dose discussed with the patient, but i cannot change the order. This clarification was originally requested last week and again on Tuesday this week. Thanks   Is patient supposed to be taking 1 mg or 2 mg dosage?

## 2022-11-04 ENCOUNTER — Other Ambulatory Visit (HOSPITAL_BASED_OUTPATIENT_CLINIC_OR_DEPARTMENT_OTHER): Payer: Self-pay

## 2022-11-04 MED ORDER — SEMAGLUTIDE (2 MG/DOSE) 8 MG/3ML ~~LOC~~ SOPN
2.0000 mg | PEN_INJECTOR | SUBCUTANEOUS | 2 refills | Status: DC
  Filled 2022-11-04: qty 3, 28d supply, fill #0

## 2022-11-04 NOTE — Telephone Encounter (Signed)
28m weekly dose.

## 2022-11-04 NOTE — Telephone Encounter (Signed)
Prescription sent

## 2022-11-15 ENCOUNTER — Encounter: Payer: Self-pay | Admitting: Physician Assistant

## 2022-11-16 ENCOUNTER — Other Ambulatory Visit: Payer: Self-pay | Admitting: Neurology

## 2022-11-16 ENCOUNTER — Other Ambulatory Visit (HOSPITAL_BASED_OUTPATIENT_CLINIC_OR_DEPARTMENT_OTHER): Payer: Self-pay

## 2022-11-16 MED ORDER — SEMAGLUTIDE (2 MG/DOSE) 8 MG/3ML ~~LOC~~ SOPN
2.0000 mg | PEN_INJECTOR | SUBCUTANEOUS | 0 refills | Status: DC
  Filled 2022-11-16 – 2022-12-05 (×2): qty 9, 84d supply, fill #0

## 2022-11-27 ENCOUNTER — Other Ambulatory Visit: Payer: Self-pay | Admitting: Physician Assistant

## 2022-11-27 DIAGNOSIS — F331 Major depressive disorder, recurrent, moderate: Secondary | ICD-10-CM

## 2022-11-30 ENCOUNTER — Encounter: Payer: Self-pay | Admitting: Physician Assistant

## 2022-11-30 ENCOUNTER — Telehealth: Payer: Self-pay

## 2022-11-30 ENCOUNTER — Ambulatory Visit (INDEPENDENT_AMBULATORY_CARE_PROVIDER_SITE_OTHER): Payer: BC Managed Care – PPO | Admitting: Physician Assistant

## 2022-11-30 ENCOUNTER — Other Ambulatory Visit: Payer: Self-pay | Admitting: Physician Assistant

## 2022-11-30 VITALS — BP 127/85 | HR 82 | Ht >= 80 in | Wt 303.0 lb

## 2022-11-30 DIAGNOSIS — K631 Perforation of intestine (nontraumatic): Secondary | ICD-10-CM

## 2022-11-30 DIAGNOSIS — E785 Hyperlipidemia, unspecified: Secondary | ICD-10-CM

## 2022-11-30 DIAGNOSIS — K625 Hemorrhage of anus and rectum: Secondary | ICD-10-CM | POA: Diagnosis not present

## 2022-11-30 DIAGNOSIS — E1165 Type 2 diabetes mellitus with hyperglycemia: Secondary | ICD-10-CM

## 2022-11-30 DIAGNOSIS — Z13220 Encounter for screening for lipoid disorders: Secondary | ICD-10-CM | POA: Diagnosis not present

## 2022-11-30 DIAGNOSIS — F331 Major depressive disorder, recurrent, moderate: Secondary | ICD-10-CM | POA: Diagnosis not present

## 2022-11-30 DIAGNOSIS — E559 Vitamin D deficiency, unspecified: Secondary | ICD-10-CM

## 2022-11-30 LAB — POCT GLYCOSYLATED HEMOGLOBIN (HGB A1C): Hemoglobin A1C: 5.6 % (ref 4.0–5.6)

## 2022-11-30 MED ORDER — BUPROPION HCL ER (XL) 300 MG PO TB24: 300.0000 mg | ORAL_TABLET | Freq: Every day | ORAL | 1 refills | Status: DC

## 2022-11-30 MED ORDER — CARIPRAZINE HCL 1.5 MG PO CAPS: 1.5000 mg | ORAL_CAPSULE | Freq: Every day | ORAL | 1 refills | Status: DC

## 2022-11-30 MED ORDER — HYDROCORTISONE ACETATE 25 MG RE SUPP: 25.0000 mg | Freq: Two times a day (BID) | RECTAL | 1 refills | Status: AC | PRN

## 2022-11-30 NOTE — Telephone Encounter (Signed)
Authorization Expiration Date: November 30, 2023

## 2022-11-30 NOTE — Progress Notes (Signed)
hy  Established Patient Office Visit  Subjective   Patient ID: Elijah Ford, male    DOB: 11-08-1975  Age: 47 y.o. MRN: [id number]  Chief Complaint  Patient presents with   Follow-up   Depression   Diabetes    HPI Pt is a 47 yo obese male with T2DM, HTN, Asthma, GERD, OSA, hypogonadism, MDD who presents to the clinic with his wife for new rectal bleeding.   He has been having bright red blood in stool for the last month off and on. Hx of colon perforation back in September of 2023 but no problems since. No fever, chills, abdominal pain. No pain with bowel movements.   He has been struggling with depression. He feels irrational fears like he is going to lose his job etc. He has had some thoughts of how he could hurt himself but not taken any action in those steps. He is in counseling weekly. Not changed any medications.    Active Ambulatory Problems    Diagnosis Date Noted   Essential hypertension, benign 11/04/2013   Anxiety 11/04/2013   Depression 11/04/2013   Class 1 obesity due to excess calories with serious comorbidity and body mass index (BMI) of 33.0 to 33.9 in adult 11/04/2013   GERD (gastroesophageal reflux disease) 11/04/2013   Tarsometatarsal osteoarthritis of both feet 06/20/2014   Bilateral extensor hallucis longus tendinitis 06/27/2014   Metatarsal stress fracture of left foot 07/08/2014   Prediabetes 07/09/2014   Gout 07/09/2014   Moderate persistent asthma 11/26/2014   Eosinophilia 06/26/2015   Male hypogonadism 07/06/2015   Left ear pain 12/02/2015   Plantar fasciitis, left 03/09/2016   Elevated hematocrit 04/06/2016   Ventral hernia without obstruction or gangrene 06/01/2016   Plantar fascial fibromatosis of left foot 12/14/2016   Bilateral metatarsal stress injury 05/09/2017   OSA (obstructive sleep apnea) 07/17/2014   Hyperlipidemia LDL goal <70 07/18/2017   Vitamin D deficiency 07/18/2017   Elevated serum creatinine 07/18/2017   Low serum vitamin B12  07/18/2017   Seasonal allergies 06/13/2018   ED (erectile dysfunction) 04/10/2015   Epididymal cyst 04/10/2015   Incomplete emptying of bladder 04/10/2015   S/P UPPP (uvulopalatopharyngoplasty) 07/17/2014   Seasonal and perennial allergic rhinitis 07/12/2018   Allergic conjunctivitis 07/12/2018   Oral allergy syndrome 07/12/2018   Stressful life event affecting family 10/30/2018   Stress due to illness of family member 12/07/2018   Acute stress reaction 12/07/2018   Moderate episode of recurrent major depressive disorder ([place]) 04/08/2019   Subcutaneous mass of head 10/16/2019   Lesion of penis 10/16/2019   Bell's palsy 11/16/2020   Type 2 diabetes mellitus with hyperglycemia ([place]) 11/17/2020   Impingement syndrome of both shoulders 09/03/2021   Perforation of colon ([place]) 04/06/2022   Adenomatous polyp of colon 04/08/2022   Rectal bleeding 11/30/2022   Resolved Ambulatory Problems    Diagnosis Date Noted   Tinea pedis 06/27/2014   Acute bronchitis 06/27/2014   CAP (community acquired pneumonia) 09/18/2014   Pneumonia 09/18/2014   Bronchitis, asthmatic 10/15/2014   Asthma with acute exacerbation 11/26/2014   Skin lesion of right lower extremity 03/13/2015   Past Medical History:  Diagnosis Date   Arthritis    Asthma    Hyperlipidemia    Hypertension    Sleep apnea     ROS See HPI.    Objective:     BP 127/85   Pulse 82   Ht 6\' 8"  (2.032 m)   Wt (!) 303 lb (137.4 kg)  SpO2 97%   BMI 33.29 kg/m  BP Readings from Last 3 Encounters:  11/30/22 127/85  04/06/22 112/66  01/31/22 121/78   Wt Readings from Last 3 Encounters:  11/30/22 (!) 303 lb (137.4 kg)  04/06/22 283 lb (128.4 kg)  01/31/22 (!) 305 lb (138.3 kg)    ..    11/30/2022   11:09 AM 04/06/2022    3:17 PM 01/31/2022    2:02 PM 11/01/2021    9:49 AM 11/16/2020    9:15 AM  Depression screen PHQ 2/9  Decreased Interest 2 0 0  0  Down, Depressed, Hopeless 2 0 1 0 1  PHQ - 2 Score 4 0 1 0 1  Altered  sleeping 0    1  Tired, decreased energy 1    1  Change in appetite 0    0  Feeling bad or failure about yourself  2    0  Trouble concentrating 1    0  Moving slowly or fidgety/restless 0    0  Suicidal thoughts 2    0  PHQ-9 Score 10    3  Difficult doing work/chores Somewhat difficult    Not difficult at all   .Marland Kitchen    11/30/2022   11:09 AM 11/16/2020    9:16 AM 08/20/2019    9:47 AM 04/08/2019   10:44 AM  GAD 7 : Generalized Anxiety Score  Nervous, Anxious, on Edge 2 1 1 3   Control/stop worrying 2 0 1 3  Worry too much - different things 2 0 1 3  Trouble relaxing 2 1 3 3   Restless 1 1 0 3  Easily annoyed or irritable 0 0 0 1  Afraid - awful might happen 2 0 1 3  Total GAD 7 Score 11 3 7 19   Anxiety Difficulty Somewhat difficult Not difficult at all Not difficult at all Extremely difficult     Results for orders placed or performed in visit on 11/30/22  CBC w/Diff/Platelet  Result Value Ref Range   WBC 5.5 3.8 - 10.8 Thousand/uL   RBC 6.09 (H) 4.20 - 5.80 Million/uL   Hemoglobin 18.8 (H) 13.2 - 17.1 g/dL   HCT 53.9 (H) 38.5 - 50.0 %   MCV 88.5 80.0 - 100.0 fL   MCH 30.9 27.0 - 33.0 pg   MCHC 34.9 32.0 - 36.0 g/dL   RDW 13.9 11.0 - 15.0 %   Platelets 311 140 - 400 Thousand/uL   MPV 9.3 7.5 - 12.5 fL   Neutro Abs 3,097 1,500 - 7,800 cells/uL   Lymphs Abs 1,782 850 - 3,900 cells/uL   Absolute Monocytes 341 200 - 950 cells/uL   Eosinophils Absolute 220 15 - 500 cells/uL   Basophils Absolute 61 0 - 200 cells/uL   Neutrophils Relative % 56.3 %   Total Lymphocyte 32.4 %   Monocytes Relative 6.2 %   Eosinophils Relative 4.0 %   Basophils Relative 1.1 %  Fe+TIBC+Fer  Result Value Ref Range   Iron 95 50 - 180 mcg/dL   TIBC 280 250 - 425 mcg/dL (calc)   %SAT 34 20 - 48 % (calc)   Ferritin 181 38 - 380 ng/mL  COMPLETE METABOLIC PANEL WITH GFR  Result Value Ref Range   Glucose, Bld 87 65 - 99 mg/dL   BUN 13 7 - 25 mg/dL   Creat 1.54 (H) 0.60 - 1.29 mg/dL   eGFR 56 (L) >  OR = 60 mL/min/1.59m2   BUN/Creatinine Ratio 8 6 -  22 (calc)   Sodium 142 135 - 146 mmol/L   Potassium 4.7 3.5 - 5.3 mmol/L   Chloride 105 98 - 110 mmol/L   CO2 25 20 - 32 mmol/L   Calcium 10.5 (H) 8.6 - 10.3 mg/dL   Total Protein 7.7 6.1 - 8.1 g/dL   Albumin 5.0 3.6 - 5.1 g/dL   Globulin 2.7 1.9 - 3.7 g/dL (calc)   AG Ratio 1.9 1.0 - 2.5 (calc)   Total Bilirubin 0.8 0.2 - 1.2 mg/dL   Alkaline phosphatase (APISO) 49 36 - 130 U/L   AST 18 10 - 40 U/L   ALT 24 9 - 46 U/L  TSH  Result Value Ref Range   TSH 0.91 0.40 - 4.50 mIU/L  VITAMIN D 25 Hydroxy (Vit-D Deficiency, Fractures)  Result Value Ref Range   Vit D, 25-Hydroxy 104 (H) 30 - 100 ng/mL  Lipid Panel w/reflex Direct LDL  Result Value Ref Range   Cholesterol 196 <200 mg/dL   HDL 53 > OR = 40 mg/dL   Triglycerides 133 <150 mg/dL   LDL Cholesterol (Calc) 118 (H) mg/dL (calc)   Total CHOL/HDL Ratio 3.7 <5.0 (calc)   Non-HDL Cholesterol (Calc) 143 (H) <130 mg/dL (calc)  POCT glycosylated hemoglobin (Hb A1C)  Result Value Ref Range   Hemoglobin A1C 5.6 4.0 - 5.6 %   HbA1c POC (<> result, manual entry)     HbA1c, POC (prediabetic range)     HbA1c, POC (controlled diabetic range)       Physical Exam Vitals reviewed.  Constitutional:      Appearance: Normal appearance.  HENT:     Head: Normocephalic.  Cardiovascular:     Rate and Rhythm: Normal rate and regular rhythm.  Pulmonary:     Effort: Pulmonary effort is normal.     Breath sounds: Normal breath sounds.  Musculoskeletal:     Right lower leg: No edema.     Left lower leg: No edema.  Neurological:     General: No focal deficit present.     Mental Status: He is alert and oriented to person, place, and time.  Psychiatric:        Mood and Affect: Mood normal.      The 10-year ASCVD risk score (Arnett DK, et al., 2019) is: 4.3%    Assessment & Plan:  .Marland KitchenAnhad was seen today for follow-up, depression and diabetes.  Diagnoses and all orders for this  visit:  Type 2 diabetes mellitus with hyperglycemia, unspecified whether long term insulin use (HCC) -     POCT glycosylated hemoglobin (Hb A1C) -     TSH  Moderate episode of recurrent major depressive disorder (HCC) -     cariprazine (VRAYLAR) 1.5 MG capsule; Take 1 capsule (1.5 mg total) by mouth daily. -     buPROPion (WELLBUTRIN XL) 300 MG 24 hr tablet; Take 1 tablet (300 mg total) by mouth daily.  Screening for lipid disorders -     Lipid Panel w/reflex Direct LDL  Hyperlipidemia LDL goal <70  Perforation of colon (HCC)  Rectal bleeding -     CBC w/Diff/Platelet -     Fe+TIBC+Fer -     COMPLETE METABOLIC PANEL WITH GFR -     Lipid Panel w/reflex Direct LDL -     hydrocortisone (ANUSOL-HC) 25 MG suppository; Place 1 suppository (25 mg total) rectally 2 (two) times daily as needed for hemorrhoids.  Vitamin D deficiency -     VITAMIN D 25 Hydroxy (  Vit-D Deficiency, Fractures)   Labs ordered for screening purposes.  A1C to goal today.  PHQ not to goal.  Added vraylar and cut back to 3 tablets of prozac Discussed SE of vraylar Follow up in 4 -6 weeks Continue with weekly counseling  Will check hemoglobin to make sure no worrisome blood loss Vitals reassuring ? Internal hemorrhoids Start anusol suppositories as needed Get back in with GI for evaluation    Iran Planas, PA-C

## 2022-11-30 NOTE — Patient Instructions (Addendum)
Get labs Start vraylar Make appt with GI.   Cariprazine Capsules What is this medication? CARIPRAZINE (car i PRA zeen) treats schizophrenia and bipolar disorder. It may also be used with antidepressant medication to treat depression. It works by balancing the levels of dopamine and serotonin in your brain, substances that help regulate mood, behaviors, and thoughts. It belongs to a group of medications called antipsychotics. Antipsychotic medications can be used to treat several kinds of mental health conditions. This medicine may be used for other purposes; ask your health care provider or pharmacist if you have questions. COMMON BRAND NAME(S): VRAYLAR What should I tell my care team before I take this medication? They need to know if you have any of these conditions: Dementia Diabetes Difficulty swallowing Have trouble controlling your muscles Heart disease High cholesterol History of breast cancer History of stroke Kidney disease Liver disease Low blood counts, like low white cell, platelet, or red cell counts Low blood pressure Parkinson's disease Seizures Suicidal thoughts, plans or attempt; a previous suicide attempt by you or a family member An unusual or allergic reaction to cariprazine, other medications, foods, dyes, or preservatives Pregnant or trying to get pregnant Breast-feeding How should I use this medication? Take this medication by mouth with a glass of water. Follow the directions on the prescription label. You may take it with or without food. Take your medication at regular intervals. Do not take it more often than directed. Do not stop taking except on your care team's advice. A special MedGuide will be given to you by the pharmacist with each prescription and refill. Be sure to read this information carefully each time. Talk to your care team about the use of this medication in children. Special care may be needed. Overdosage: If you think you have taken too  much of this medicine contact a poison control center or emergency room at once. NOTE: This medicine is only for you. Do not share this medicine with others. What if I miss a dose? If you miss a dose, take it as soon as you can. If it is almost time for your next dose, take only that dose. Do not take double or extra doses. What may interact with this medication? Do not take this medication with any of the following: Metoclopramide This medication may also interact with the following: Antihistamines for allergy, cough, and cold Carbamazepine Certain medications for anxiety or sleep Certain medications for depression like amitriptyline, fluoxetine, sertraline Certain medications for fungal infections like itraconazole, ketoconazole General anesthetics like halothane, isoflurane, methoxyflurane, propofol Levodopa or other medications for Parkinson's disease Medications for blood pressure Medications for seizures Medications that relax muscles for surgery Narcotic medications for pain Phenothiazines like chlorpromazine, prochlorperazine, thioridazine Rifampin This list may not describe all possible interactions. Give your health care provider a list of all the medicines, herbs, non-prescription drugs, or dietary supplements you use. Also tell them if you smoke, drink alcohol, or use illegal drugs. Some items may interact with your medicine. What should I watch for while using this medication? Visit your care team for regular checks on your progress. Tell your care team if symptoms do not start to get better or if they get worse. Do not stop taking except on your care team's advice. You may develop a severe reaction. Your care team will tell you how much medication to take. Patients and their families should watch out for new or worsening depression or thoughts of suicide. Also watch out for sudden changes in feelings  such as feeling anxious, agitated, panicky, irritable, hostile, aggressive,  impulsive, severely restless, overly excited and hyperactive, or not being able to sleep. If this happens, especially at the beginning of treatment or after a change in dose, call your care team. You may get dizzy or drowsy. Do not drive, use machinery, or do anything that needs mental alertness until you know how this medication affects you. Do not stand or sit up quickly, especially if you are an older patient. This reduces the risk of dizzy or fainting spells. Alcohol may interfere with the effect of this medication. Avoid alcoholic drinks. This medication may cause dry eyes and blurred vision. If you wear contact lenses you may feel some discomfort. Lubricating drops may help. See your eye doctor if the problem does not go away or is severe. This medication may increase blood sugar. Ask your care team if changes in diet or medications are needed if you have diabetes. This medication can cause problems with controlling your body temperature. It can lower the response of your body to cold temperatures. If possible, stay indoors during cold weather. If you must go outdoors, wear warm clothes. It can also lower the response of your body to heat. Do not overheat. Do not over-exercise. Stay out of the sun when possible. If you must be in the sun, wear cool clothing. Drink plenty of water. If you have trouble controlling your body temperature, call your care team right away. Women should inform their care team if they wish to become pregnant or think they might be pregnant. The effects of this medication on an unborn child are not known. A registry is available to monitor pregnancy outcomes in pregnant women exposed to this medication or similar medications. Talk to your care team or pharmacist for more information. What side effects may I notice from receiving this medication? Side effects that you should report to your care team as soon as possible: Allergic reactions--skin rash, itching, hives, swelling of  the face, lips, tongue, or throat High blood sugar (hyperglycemia)--increased thirst or amount of urine, unusual weakness or fatigue, blurry vision High fever, stiff muscles, increased sweating, fast or irregular heartbeat, and confusion, which may be signs of neuroleptic malignant syndrome Infection--fever, chills, cough, or sore throat Low blood pressure--dizziness, feeling faint or lightheaded, blurry vision Pain or trouble swallowing Seizures Stroke--sudden numbness or weakness of the face, arm, or leg, trouble speaking, confusion, trouble walking, loss of balance or coordination, dizziness, severe headache, change in vision Thoughts of suicide or self-harm, worsening mood, feelings of depression Uncontrolled and repetitive body movements, muscle stiffness or spasms, tremors or shaking, loss of balance or coordination, restlessness, shuffling walk, which may be signs of extrapyramidal symptoms (EPS) Side effects that usually do not require medical attention (report to your care team if they continue or are bothersome): Constipation Dizziness Drowsiness Nausea Trouble sleeping Upset stomach Vomiting This list may not describe all possible side effects. Call your doctor for medical advice about side effects. You may report side effects to FDA at 1-800-FDA-1088. Where should I keep my medication? Keep out of the reach of children. Store at room temperature between 15 and 30 degrees C (59 and 86 degrees F). Protect from light. Throw away any unused medication after the expiration date. NOTE: This sheet is a summary. It may not cover all possible information. If you have questions about this medicine, talk to your doctor, pharmacist, or health care provider.  2023 Elsevier/Gold Standard (2021-09-23 00:00:00)

## 2022-11-30 NOTE — Telephone Encounter (Signed)
Please get PA.

## 2022-11-30 NOTE — Telephone Encounter (Signed)
Initiated Prior authorization [id number] 1.'5MG'$  capsules Via: Covermymeds Case/Key:[id number] Status: approved as of 11/30/22 Reason: Authorization Expiration Date: November 30, 2023 Notified Pt via: Pharmacist, community

## 2022-12-01 LAB — IRON,TIBC AND FERRITIN PANEL
%SAT: 34 % (calc) (ref 20–48)
Ferritin: 181 ng/mL (ref 38–380)
Iron: 95 ug/dL (ref 50–180)
TIBC: 280 mcg/dL (calc) (ref 250–425)

## 2022-12-01 LAB — CBC WITH DIFFERENTIAL/PLATELET
Absolute Monocytes: 341 cells/uL (ref 200–950)
Basophils Absolute: 61 cells/uL (ref 0–200)
Basophils Relative: 1.1 %
Eosinophils Absolute: 220 cells/uL (ref 15–500)
Eosinophils Relative: 4 %
HCT: 53.9 % — ABNORMAL HIGH (ref 38.5–50.0)
Hemoglobin: 18.8 g/dL — ABNORMAL HIGH (ref 13.2–17.1)
Lymphs Abs: 1782 cells/uL (ref 850–3900)
MCH: 30.9 pg (ref 27.0–33.0)
MCHC: 34.9 g/dL (ref 32.0–36.0)
MCV: 88.5 fL (ref 80.0–100.0)
MPV: 9.3 fL (ref 7.5–12.5)
Monocytes Relative: 6.2 %
Neutro Abs: 3097 cells/uL (ref 1500–7800)
Neutrophils Relative %: 56.3 %
Platelets: 311 10*3/uL (ref 140–400)
RBC: 6.09 10*6/uL — ABNORMAL HIGH (ref 4.20–5.80)
RDW: 13.9 % (ref 11.0–15.0)
Total Lymphocyte: 32.4 %
WBC: 5.5 10*3/uL (ref 3.8–10.8)

## 2022-12-01 LAB — COMPLETE METABOLIC PANEL WITH GFR
AG Ratio: 1.9 (calc) (ref 1.0–2.5)
ALT: 24 U/L (ref 9–46)
AST: 18 U/L (ref 10–40)
Albumin: 5 g/dL (ref 3.6–5.1)
Alkaline phosphatase (APISO): 49 U/L (ref 36–130)
BUN/Creatinine Ratio: 8 (calc) (ref 6–22)
BUN: 13 mg/dL (ref 7–25)
CO2: 25 mmol/L (ref 20–32)
Calcium: 10.5 mg/dL — ABNORMAL HIGH (ref 8.6–10.3)
Chloride: 105 mmol/L (ref 98–110)
Creat: 1.54 mg/dL — ABNORMAL HIGH (ref 0.60–1.29)
Globulin: 2.7 g/dL (calc) (ref 1.9–3.7)
Glucose, Bld: 87 mg/dL (ref 65–99)
Potassium: 4.7 mmol/L (ref 3.5–5.3)
Sodium: 142 mmol/L (ref 135–146)
Total Bilirubin: 0.8 mg/dL (ref 0.2–1.2)
Total Protein: 7.7 g/dL (ref 6.1–8.1)
eGFR: 56 mL/min/{1.73_m2} — ABNORMAL LOW (ref >=60)

## 2022-12-01 LAB — TSH: TSH: 0.91 mIU/L (ref 0.40–4.50)

## 2022-12-01 LAB — LIPID PANEL W/REFLEX DIRECT LDL
Cholesterol: 196 mg/dL (ref <200)
HDL: 53 mg/dL (ref >=40)
LDL Cholesterol (Calc): 118 mg/dL (calc) — ABNORMAL HIGH
Non-HDL Cholesterol (Calc): 143 mg/dL (calc) — ABNORMAL HIGH (ref <130)
Total CHOL/HDL Ratio: 3.7 (calc) (ref <5.0)
Triglycerides: 133 mg/dL (ref <150)

## 2022-12-01 LAB — VITAMIN D 25 HYDROXY (VIT D DEFICIENCY, FRACTURES): Vit D, 25-Hydroxy: 104 ng/mL — ABNORMAL HIGH (ref 30–100)

## 2022-12-02 NOTE — Progress Notes (Signed)
Taveion,   Thyroid looks good.  Serum iron and ferritin normal.  LDL, bad cholesterol, much better.  TG in normal range.  Vitamin D too high. Back off to 1,000 units daily.  GFR decreased some, stay hydrated and recheck in 4 weeks.  Your hemoglobin and hematocrit are actually showing "too much, too thick" A lot of times this is due to testosterone. Decrease shot to 175mg  or .22mL every 2 weeks. Recheck in 4weeks.

## 2022-12-05 ENCOUNTER — Encounter: Payer: Self-pay | Admitting: Physician Assistant

## 2022-12-06 ENCOUNTER — Other Ambulatory Visit (HOSPITAL_BASED_OUTPATIENT_CLINIC_OR_DEPARTMENT_OTHER): Payer: Self-pay

## 2022-12-06 ENCOUNTER — Other Ambulatory Visit: Payer: Self-pay

## 2022-12-12 ENCOUNTER — Encounter: Payer: Self-pay | Admitting: Physician Assistant

## 2022-12-12 DIAGNOSIS — K625 Hemorrhage of anus and rectum: Secondary | ICD-10-CM

## 2022-12-16 ENCOUNTER — Other Ambulatory Visit (HOSPITAL_BASED_OUTPATIENT_CLINIC_OR_DEPARTMENT_OTHER): Payer: Self-pay

## 2022-12-22 DIAGNOSIS — Z9049 Acquired absence of other specified parts of digestive tract: Secondary | ICD-10-CM | POA: Diagnosis not present

## 2022-12-22 DIAGNOSIS — K631 Perforation of intestine (nontraumatic): Secondary | ICD-10-CM | POA: Diagnosis not present

## 2022-12-22 DIAGNOSIS — Z86010 Personal history of colonic polyps: Secondary | ICD-10-CM | POA: Diagnosis not present

## 2022-12-22 DIAGNOSIS — K625 Hemorrhage of anus and rectum: Secondary | ICD-10-CM | POA: Diagnosis not present

## 2022-12-23 ENCOUNTER — Encounter: Payer: Self-pay | Admitting: Physician Assistant

## 2022-12-23 DIAGNOSIS — R059 Cough, unspecified: Secondary | ICD-10-CM

## 2022-12-26 ENCOUNTER — Other Ambulatory Visit: Payer: Self-pay | Admitting: Family Medicine

## 2022-12-26 DIAGNOSIS — F331 Major depressive disorder, recurrent, moderate: Secondary | ICD-10-CM

## 2022-12-26 MED ORDER — ALBUTEROL SULFATE HFA 108 (90 BASE) MCG/ACT IN AERS
2.0000 | INHALATION_SPRAY | RESPIRATORY_TRACT | 3 refills | Status: DC | PRN
Start: 2022-12-26 — End: 2023-09-15

## 2022-12-29 DIAGNOSIS — K5730 Diverticulosis of large intestine without perforation or abscess without bleeding: Secondary | ICD-10-CM | POA: Diagnosis not present

## 2022-12-29 DIAGNOSIS — K648 Other hemorrhoids: Secondary | ICD-10-CM | POA: Diagnosis not present

## 2022-12-29 DIAGNOSIS — K635 Polyp of colon: Secondary | ICD-10-CM | POA: Diagnosis not present

## 2022-12-29 DIAGNOSIS — D124 Benign neoplasm of descending colon: Secondary | ICD-10-CM | POA: Diagnosis not present

## 2022-12-29 DIAGNOSIS — D125 Benign neoplasm of sigmoid colon: Secondary | ICD-10-CM | POA: Diagnosis not present

## 2022-12-29 DIAGNOSIS — K921 Melena: Secondary | ICD-10-CM | POA: Diagnosis not present

## 2022-12-29 LAB — HM COLONOSCOPY

## 2023-01-19 ENCOUNTER — Other Ambulatory Visit: Payer: Self-pay | Admitting: Physician Assistant

## 2023-02-07 ENCOUNTER — Other Ambulatory Visit: Payer: Self-pay | Admitting: Physician Assistant

## 2023-02-07 DIAGNOSIS — F331 Major depressive disorder, recurrent, moderate: Secondary | ICD-10-CM

## 2023-02-10 ENCOUNTER — Encounter: Payer: Self-pay | Admitting: Physician Assistant

## 2023-02-19 ENCOUNTER — Other Ambulatory Visit: Payer: Self-pay | Admitting: Family Medicine

## 2023-02-19 DIAGNOSIS — E291 Testicular hypofunction: Secondary | ICD-10-CM

## 2023-02-22 ENCOUNTER — Other Ambulatory Visit: Payer: Self-pay | Admitting: Physician Assistant

## 2023-02-22 DIAGNOSIS — F430 Acute stress reaction: Secondary | ICD-10-CM

## 2023-02-22 DIAGNOSIS — F419 Anxiety disorder, unspecified: Secondary | ICD-10-CM

## 2023-02-22 NOTE — Telephone Encounter (Signed)
Called and left message advising of the need for lab work. Orders printed.

## 2023-02-26 ENCOUNTER — Other Ambulatory Visit: Payer: Self-pay | Admitting: Physician Assistant

## 2023-02-26 DIAGNOSIS — F331 Major depressive disorder, recurrent, moderate: Secondary | ICD-10-CM

## 2023-03-07 ENCOUNTER — Encounter: Payer: Self-pay | Admitting: Physician Assistant

## 2023-03-11 ENCOUNTER — Other Ambulatory Visit: Payer: Self-pay | Admitting: Physician Assistant

## 2023-03-20 ENCOUNTER — Encounter: Payer: Self-pay | Admitting: Physician Assistant

## 2023-03-21 ENCOUNTER — Telehealth: Payer: Self-pay

## 2023-03-21 NOTE — Telephone Encounter (Addendum)
Initiated Prior authorization [id number] Cypionate 200MG /ML intramuscular solution  Via: Covermymeds Case/Key:[id number] Status: denied as of 03/21/23 Reason:Denied. The request for continuation of coverage of Testosterone Cypionate is denied. This medication may be approved for continued treatment when the member has had a positive clinical response to therapy (improvement in symptoms of low testosterone, increased energy, testosterone levels have improved, etc.). Also, the member must have had a testosterone level checked in the past year. In this case, it is unknown if the member has had a positive response to this medication and a testosterone level checked in the past year. Utilization management guidelines used: [employer] [facility] [place] Standard Utilization Management Criteria Notified Pt via: Mychart

## 2023-04-10 ENCOUNTER — Other Ambulatory Visit: Payer: Self-pay | Admitting: Physician Assistant

## 2023-04-12 ENCOUNTER — Telehealth: Payer: Self-pay

## 2023-04-12 ENCOUNTER — Other Ambulatory Visit: Payer: Self-pay | Admitting: Family Medicine

## 2023-04-12 ENCOUNTER — Other Ambulatory Visit (HOSPITAL_BASED_OUTPATIENT_CLINIC_OR_DEPARTMENT_OTHER): Payer: Self-pay

## 2023-04-12 MED ORDER — OZEMPIC (2 MG/DOSE) 8 MG/3ML ~~LOC~~ SOPN
2.0000 mg | PEN_INJECTOR | SUBCUTANEOUS | 0 refills | Status: DC
  Filled 2023-04-12: qty 9, 84d supply, fill #0

## 2023-04-12 NOTE — Telephone Encounter (Signed)
A PA required for Testosterone.

## 2023-04-14 NOTE — Telephone Encounter (Signed)
Thank you :)

## 2023-04-19 NOTE — Telephone Encounter (Signed)
Patient paid with [employer].

## 2023-05-11 ENCOUNTER — Other Ambulatory Visit: Payer: Self-pay | Admitting: Physician Assistant

## 2023-05-11 DIAGNOSIS — F331 Major depressive disorder, recurrent, moderate: Secondary | ICD-10-CM

## 2023-05-18 ENCOUNTER — Other Ambulatory Visit: Payer: Self-pay | Admitting: Physician Assistant

## 2023-05-18 DIAGNOSIS — F331 Major depressive disorder, recurrent, moderate: Secondary | ICD-10-CM

## 2023-05-30 ENCOUNTER — Other Ambulatory Visit: Payer: Self-pay | Admitting: Physician Assistant

## 2023-05-30 DIAGNOSIS — F331 Major depressive disorder, recurrent, moderate: Secondary | ICD-10-CM

## 2023-05-30 DIAGNOSIS — K219 Gastro-esophageal reflux disease without esophagitis: Secondary | ICD-10-CM

## 2023-06-02 DIAGNOSIS — G4733 Obstructive sleep apnea (adult) (pediatric): Secondary | ICD-10-CM | POA: Diagnosis not present

## 2023-06-06 ENCOUNTER — Other Ambulatory Visit: Payer: Self-pay | Admitting: Physician Assistant

## 2023-06-06 DIAGNOSIS — N521 Erectile dysfunction due to diseases classified elsewhere: Secondary | ICD-10-CM

## 2023-07-02 DIAGNOSIS — G4733 Obstructive sleep apnea (adult) (pediatric): Secondary | ICD-10-CM | POA: Diagnosis not present

## 2023-07-14 ENCOUNTER — Other Ambulatory Visit: Payer: Self-pay | Admitting: Physician Assistant

## 2023-07-25 ENCOUNTER — Other Ambulatory Visit: Payer: Self-pay | Admitting: Physician Assistant

## 2023-07-30 ENCOUNTER — Other Ambulatory Visit: Payer: Self-pay | Admitting: Physician Assistant

## 2023-07-30 DIAGNOSIS — F331 Major depressive disorder, recurrent, moderate: Secondary | ICD-10-CM

## 2023-08-02 DIAGNOSIS — G4733 Obstructive sleep apnea (adult) (pediatric): Secondary | ICD-10-CM | POA: Diagnosis not present

## 2023-08-24 ENCOUNTER — Other Ambulatory Visit: Payer: Self-pay | Admitting: Physician Assistant

## 2023-08-24 DIAGNOSIS — F331 Major depressive disorder, recurrent, moderate: Secondary | ICD-10-CM

## 2023-09-15 ENCOUNTER — Encounter: Payer: Self-pay | Admitting: Physician Assistant

## 2023-09-15 ENCOUNTER — Other Ambulatory Visit: Payer: Self-pay

## 2023-09-15 DIAGNOSIS — R059 Cough, unspecified: Secondary | ICD-10-CM

## 2023-09-15 MED ORDER — ALBUTEROL SULFATE HFA 108 (90 BASE) MCG/ACT IN AERS: 2.0000 | INHALATION_SPRAY | RESPIRATORY_TRACT | 1 refills | Status: DC | PRN

## 2023-09-22 ENCOUNTER — Encounter: Payer: Self-pay | Admitting: Physician Assistant

## 2023-09-22 ENCOUNTER — Ambulatory Visit: Payer: BC Managed Care – PPO | Admitting: Physician Assistant

## 2023-09-22 VITALS — BP 131/86 | HR 98 | Temp 98.8°F | Resp 18 | Ht >= 80 in | Wt 309.0 lb

## 2023-09-22 DIAGNOSIS — J069 Acute upper respiratory infection, unspecified: Secondary | ICD-10-CM

## 2023-09-22 DIAGNOSIS — J0140 Acute pansinusitis, unspecified: Secondary | ICD-10-CM

## 2023-09-22 DIAGNOSIS — R6889 Other general symptoms and signs: Secondary | ICD-10-CM

## 2023-09-22 LAB — POCT INFLUENZA A/B
Influenza A, POC: NEGATIVE
Influenza B, POC: NEGATIVE

## 2023-09-22 LAB — POCT RAPID STREP A (OFFICE): Rapid Strep A Screen: NEGATIVE

## 2023-09-22 LAB — POC COVID19 BINAXNOW: SARS Coronavirus 2 Ag: NEGATIVE

## 2023-09-22 MED ORDER — HYDROCOD POLI-CHLORPHE POLI ER 10-8 MG/5ML PO SUER: 5.0000 mL | Freq: Two times a day (BID) | ORAL | 0 refills | Status: DC | PRN

## 2023-09-22 MED ORDER — FLUTICASONE PROPIONATE 50 MCG/ACT NA SUSP
2.0000 | Freq: Every day | NASAL | 0 refills | Status: DC
Start: 2023-09-22 — End: 2023-10-19

## 2023-09-22 MED ORDER — AZITHROMYCIN 250 MG PO TABS
ORAL_TABLET | ORAL | 0 refills | Status: DC
Start: 2023-09-22 — End: 2023-11-20

## 2023-09-22 NOTE — Patient Instructions (Signed)

## 2023-09-22 NOTE — Progress Notes (Signed)
 Acute Office Visit  Subjective:     Patient ID: Elijah Ford, male    DOB: [birth year], 48 y.o.   MRN: [id number]  Chief Complaint  Patient presents with   Cough    Body chills    HPI Patient is in today for 2 days of body aches, chills, sinus pressure, ear fullness and congestion. He took mucinex  with minimal relief this morning. He denies any fever or shortness of breath. Hx of asthma. Concerned about developing into something more or moving to lungs.   He is not vaccinated for flu or covid.   .. Active Ambulatory Problems    Diagnosis Date Noted   Essential hypertension, benign 11/04/2013   Anxiety 11/04/2013   Depression 11/04/2013   Class 1 obesity due to excess calories with serious comorbidity and body mass index (BMI) of 33.0 to 33.9 in adult 11/04/2013   GERD (gastroesophageal reflux disease) 11/04/2013   Tarsometatarsal osteoarthritis of both feet 06/20/2014   Bilateral extensor hallucis longus tendinitis 06/27/2014   Metatarsal stress fracture of left foot 07/08/2014   Prediabetes 07/09/2014   Gout 07/09/2014   Moderate persistent asthma 11/26/2014   Eosinophilia 06/26/2015   Male hypogonadism 07/06/2015   Left ear pain 12/02/2015   Plantar fasciitis, left 03/09/2016   Elevated hematocrit 04/06/2016   Ventral hernia without obstruction or gangrene 06/01/2016   Plantar fascial fibromatosis of left foot 12/14/2016   Bilateral metatarsal stress injury 05/09/2017   OSA (obstructive sleep apnea) 07/17/2014   Hyperlipidemia LDL goal <70 07/18/2017   Vitamin D  deficiency 07/18/2017   Elevated serum creatinine 07/18/2017   Low serum vitamin B12 07/18/2017   Seasonal allergies 06/13/2018   ED (erectile dysfunction) 04/10/2015   Epididymal cyst 04/10/2015   Incomplete emptying of bladder 04/10/2015   S/P UPPP (uvulopalatopharyngoplasty) 07/17/2014   Seasonal and perennial allergic rhinitis 07/12/2018   Allergic conjunctivitis 07/12/2018   Oral allergy syndrome  07/12/2018   Stressful life event affecting family 10/30/2018   Stress due to illness of family member 12/07/2018   Acute stress reaction 12/07/2018   Moderate episode of recurrent major depressive disorder (HCC) 04/08/2019   Subcutaneous mass of head 10/16/2019   Lesion of penis 10/16/2019   Bell's palsy 11/16/2020   Type 2 diabetes mellitus with hyperglycemia (HCC) 11/17/2020   Impingement syndrome of both shoulders 09/03/2021   Perforation of colon (HCC) 04/06/2022   Adenomatous polyp of colon 04/08/2022   Rectal bleeding 11/30/2022   Resolved Ambulatory Problems    Diagnosis Date Noted   Tinea pedis 06/27/2014   Acute bronchitis 06/27/2014   CAP (community acquired pneumonia) 09/18/2014   Pneumonia 09/18/2014   Bronchitis, asthmatic 10/15/2014   Asthma with acute exacerbation 11/26/2014   Skin lesion of right lower extremity 03/13/2015   Past Medical History:  Diagnosis Date   Arthritis    Asthma    Hyperlipidemia    Hypertension    Sleep apnea      ROS See HPI.      Objective:    BP 131/86   Pulse 98   Temp 98.8 F (37.1 C) (Oral)   Resp 18   Ht 6' 8 (2.032 m)   Wt (!) 309 lb (140.2 kg)   SpO2 99%   BMI 33.95 kg/m  BP Readings from Last 3 Encounters:  09/22/23 131/86  11/30/22 127/85  04/06/22 112/66   Wt Readings from Last 3 Encounters:  09/22/23 (!) 309 lb (140.2 kg)  11/30/22 (!) 303 lb (137.4 kg)  04/06/22 283 lb (128.4 kg)    .SABRA Results for orders placed or performed in visit on 09/22/23  POC COVID-19   Collection Time: 09/22/23  9:26 AM  Result Value Ref Range   SARS Coronavirus 2 Ag Negative Negative  POCT Influenza A/B   Collection Time: 09/22/23  9:26 AM  Result Value Ref Range   Influenza A, POC Negative Negative   Influenza B, POC Negative Negative  POCT rapid strep A   Collection Time: 09/22/23  9:27 AM  Result Value Ref Range   Rapid Strep A Screen Negative Negative     Physical Exam Constitutional:      Appearance:  Normal appearance. He is obese.  HENT:     Head: Normocephalic.     Right Ear: Ear canal and external ear normal. There is no impacted cerumen.     Left Ear: Ear canal and external ear normal. There is no impacted cerumen.     Ears:     Comments: Dull erythematous and bulging TM's bilaterally.     Mouth/Throat:     Mouth: Mucous membranes are moist.     Comments: Uvula removed.  Eyes:     Conjunctiva/sclera: Conjunctivae normal.  Cardiovascular:     Rate and Rhythm: Normal rate and regular rhythm.  Pulmonary:     Effort: Pulmonary effort is normal.     Breath sounds: Normal breath sounds. No wheezing, rhonchi or rales.  Musculoskeletal:     Cervical back: Normal range of motion and neck supple. No tenderness.  Lymphadenopathy:     Cervical: No cervical adenopathy.  Neurological:     General: No focal deficit present.     Mental Status: He is alert and oriented to person, place, and time.  Psychiatric:        Mood and Affect: Mood normal.          Assessment & Plan:  .SABRAZaylen was seen today for cough.  Diagnoses and all orders for this visit:  Viral URI with cough -     fluticasone  (FLONASE ) 50 MCG/ACT nasal spray; Place 2 sprays into both nostrils daily. -     chlorpheniramine-HYDROcodone  (TUSSIONEX) 10-8 MG/5ML; Take 5 mLs by mouth every 12 (twelve) hours as needed.  Flu-like symptoms -     POCT rapid strep A -     POC COVID-19 -     POCT Influenza A/B  Acute non-recurrent pansinusitis -     azithromycin  (ZITHROMAX  Z-PAK) 250 MG tablet; Take 2 tablets (500 mg) on  Day 1,  followed by 1 tablet (250 mg) once daily on Days 2 through 5. -     fluticasone  (FLONASE ) 50 MCG/ACT nasal spray; Place 2 sprays into both nostrils daily.   Viral symptoms, concerned about progression into sinusitis Strep, flu, covid negative Continue symptomatic care Start flonase  Add zpak after 24-48 hours of symptoms Continue to use albuterol  nebulizer if having more SOB/chest  tightness Tussionex for cough Follow up as needed if symptoms persist or worsen  Elijah Bologna, PA-C

## 2023-10-15 ENCOUNTER — Other Ambulatory Visit: Payer: Self-pay | Admitting: Physician Assistant

## 2023-10-15 DIAGNOSIS — J0140 Acute pansinusitis, unspecified: Secondary | ICD-10-CM

## 2023-10-15 DIAGNOSIS — J069 Acute upper respiratory infection, unspecified: Secondary | ICD-10-CM

## 2023-10-15 DIAGNOSIS — F331 Major depressive disorder, recurrent, moderate: Secondary | ICD-10-CM

## 2023-11-10 ENCOUNTER — Other Ambulatory Visit: Payer: Self-pay | Admitting: Physician Assistant

## 2023-11-10 DIAGNOSIS — R059 Cough, unspecified: Secondary | ICD-10-CM

## 2023-11-14 ENCOUNTER — Encounter: Payer: Self-pay | Admitting: Physician Assistant

## 2023-11-15 ENCOUNTER — Other Ambulatory Visit: Payer: Self-pay | Admitting: Physician Assistant

## 2023-11-15 DIAGNOSIS — J0140 Acute pansinusitis, unspecified: Secondary | ICD-10-CM

## 2023-11-15 DIAGNOSIS — J069 Acute upper respiratory infection, unspecified: Secondary | ICD-10-CM

## 2023-11-20 ENCOUNTER — Ambulatory Visit (INDEPENDENT_AMBULATORY_CARE_PROVIDER_SITE_OTHER): Payer: BC Managed Care – PPO | Admitting: Physician Assistant

## 2023-11-20 VITALS — BP 122/80 | HR 76

## 2023-11-20 DIAGNOSIS — Z125 Encounter for screening for malignant neoplasm of prostate: Secondary | ICD-10-CM | POA: Diagnosis not present

## 2023-11-20 DIAGNOSIS — Z13220 Encounter for screening for lipoid disorders: Secondary | ICD-10-CM

## 2023-11-20 DIAGNOSIS — I10 Essential (primary) hypertension: Secondary | ICD-10-CM | POA: Diagnosis not present

## 2023-11-20 DIAGNOSIS — E785 Hyperlipidemia, unspecified: Secondary | ICD-10-CM | POA: Diagnosis not present

## 2023-11-20 DIAGNOSIS — Z0000 Encounter for general adult medical examination without abnormal findings: Secondary | ICD-10-CM

## 2023-11-20 DIAGNOSIS — Z131 Encounter for screening for diabetes mellitus: Secondary | ICD-10-CM

## 2023-11-20 DIAGNOSIS — F331 Major depressive disorder, recurrent, moderate: Secondary | ICD-10-CM

## 2023-11-20 DIAGNOSIS — E291 Testicular hypofunction: Secondary | ICD-10-CM

## 2023-11-20 DIAGNOSIS — R7303 Prediabetes: Secondary | ICD-10-CM | POA: Diagnosis not present

## 2023-11-20 DIAGNOSIS — Z23 Encounter for immunization: Secondary | ICD-10-CM

## 2023-11-20 NOTE — Progress Notes (Unsigned)
 Complete physical exam  Patient: Elijah Ford   DOB: [birth year]   48 y.o. Male  MRN: [id number]  Subjective:    Chief Complaint  Patient presents with   Annual Exam    Elijah Ford is a 48 y.o. male who presents today for a complete physical exam. He reports consuming a general diet. The patient does not participate in regular exercise at present. He generally feels well. He reports sleeping fairly well. He does have additional problems to discuss today.    Most recent fall risk assessment:    09/22/2023    9:18 AM  Fall Risk   Falls in the past year? 0  Number falls in past yr: 0  Injury with Fall? 0     Most recent depression screenings:    11/21/2023    7:43 AM 09/22/2023    9:18 AM  PHQ 2/9 Scores  PHQ - 2 Score 0 0  PHQ- 9 Score 0     Vision:Not within last year  and Dental: No current dental problems and Receives regular dental care  Patient Active Problem List   Diagnosis Date Noted   Rectal bleeding 11/30/2022   Adenomatous polyp of colon 04/08/2022   Perforation of colon (HCC) 04/06/2022   Impingement syndrome of both shoulders 09/03/2021   Type 2 diabetes mellitus with hyperglycemia (HCC) 11/17/2020   Bell's palsy 11/16/2020   Subcutaneous mass of head 10/16/2019   Lesion of penis 10/16/2019   Moderate episode of recurrent major depressive disorder (HCC) 04/08/2019   Stress due to illness of family member 12/07/2018   Acute stress reaction 12/07/2018   Stressful life event affecting family 10/30/2018   Seasonal and perennial allergic rhinitis 07/12/2018   Allergic conjunctivitis 07/12/2018   Oral allergy syndrome 07/12/2018   Seasonal allergies 06/13/2018   Hyperlipidemia LDL goal <70 07/18/2017   Vitamin D deficiency 07/18/2017   Elevated serum creatinine 07/18/2017   Low serum vitamin B12 07/18/2017   Bilateral metatarsal stress injury 05/09/2017   Plantar fascial fibromatosis of left foot 12/14/2016   Ventral hernia without obstruction or  gangrene 06/01/2016   Elevated hematocrit 04/06/2016   Plantar fasciitis, left 03/09/2016   Left ear pain 12/02/2015   Male hypogonadism 07/06/2015   Eosinophilia 06/26/2015   ED (erectile dysfunction) 04/10/2015   Epididymal cyst 04/10/2015   Incomplete emptying of bladder 04/10/2015   Moderate persistent asthma 11/26/2014   OSA (obstructive sleep apnea) 07/17/2014   S/P UPPP (uvulopalatopharyngoplasty) 07/17/2014   Prediabetes 07/09/2014   Gout 07/09/2014   Metatarsal stress fracture of left foot 07/08/2014   Bilateral extensor hallucis longus tendinitis 06/27/2014   Tarsometatarsal osteoarthritis of both feet 06/20/2014   Essential hypertension, benign 11/04/2013   Anxiety 11/04/2013   Depression 11/04/2013   Class 1 obesity due to excess calories with serious comorbidity and body mass index (BMI) of 33.0 to 33.9 in adult 11/04/2013   GERD (gastroesophageal reflux disease) 11/04/2013   Past Medical History:  Diagnosis Date   Anxiety    Arthritis    both feet, soreness in multiple areas, plantar fascitis, uses orthodics in both shoes    Asthma    Depression    counselling in place, sporadically    GERD (gastroesophageal reflux disease)    Hyperlipidemia    Hypertension    was in the past    Pneumonia 2015   Seasonal allergies    Sleep apnea    CPAP q night    Type 2 diabetes mellitus with  hyperglycemia (HCC) 11/17/2020   Past Surgical History:  Procedure Laterality Date   EYE SURGERY     lasik- both    INGUINAL HERNIA REPAIR Left 07/21/2016   Procedure: REPAIR LEFT INGUINAL HERNIA;  Surgeon: Harriette Bouillon, MD;  Location: [facility] OR;  Service: General;  Laterality: Left;   INSERTION OF MESH Left 07/21/2016   Procedure: INSERTION OF MESH;  Surgeon: Harriette Bouillon, MD;  Location: [facility] OR;  Service: General;  Laterality: Left;   TESTICLE TORSION REDUCTION  1980   TONSILECTOMY/ADENOIDECTOMY WITH MYRINGOTOMY     no myringotomy    uvula removal     multiple surgeries for sinuses  due to sleep apnea   VASECTOMY     VENTRAL HERNIA REPAIR N/A 07/21/2016   Procedure: REPAIR  VENTRAL  HERNIA X 2;  Surgeon: Harriette Bouillon, MD;  Location: [facility] OR;  Service: General;  Laterality: N/A;   Social History   Tobacco Use   Smoking status: Never   Smokeless tobacco: Never  Vaping Use   Vaping status: Never Used  Substance Use Topics   Alcohol use: No   Drug use: No   Family History  Problem Relation Age of Onset   Depression Mother    Cancer Father    Alcohol abuse Brother    Depression Brother    Alcohol abuse Brother    Depression Brother    Allergic rhinitis Neg Hx    Angioedema Neg Hx    Asthma Neg Hx    Eczema Neg Hx    Immunodeficiency Neg Hx    Urticaria Neg Hx    Allergies  Allergen Reactions   Other Itching    MELONS   Patient Care Team: Nolene Ebbs as PCP - General (Family Medicine)   Outpatient Medications Prior to Visit  Medication Sig   allopurinol (ZYLOPRIM) 300 MG tablet TAKE 1 TABLET BY MOUTH EVERY DAY   AMBULATORY NON FORMULARY MEDICATION Continuous positive airway pressure (CPAP) machine set at 11 cm of H2O pressure, with all supplemental supplies as needed.   B-D 3CC LUER-LOK SYR 18GX1-1/2 18G X 1-1/2" 3 ML MISC USE AS DIRECTED TO DRAW TESTOSTERONE FROM VIAL EVERY 14 DAYS   Cholecalciferol (VITAMIN D3) 50 MCG (2000 UT) capsule Take 1 capsule (2,000 Units total) by mouth daily.   EPINEPHrine (AUVI-Q) 0.3 mg/0.3 mL IJ SOAJ injection Use as directed for severe allergic reaction.   hydrocortisone (ANUSOL-HC) 25 MG suppository Place 1 suppository (25 mg total) rectally 2 (two) times daily as needed for hemorrhoids.   NEEDLE, DISP, 21 G (BD ECLIPSE NEEDLE) 21G X 1-1/2" MISC 1 Device by Does not apply route every 14 (fourteen) days.   NEEDLE, DISP, 22 G (BD ECLIPSE NEEDLE) 22G X 1-1/2" MISC Use to inject testosterone every 14 days   omeprazole (PRILOSEC) 40 MG capsule TAKE 1 CAPSULE (40 MG TOTAL) BY MOUTH DAILY.   Semaglutide, 2 MG/DOSE,  (OZEMPIC, 2 MG/DOSE,) 8 MG/3ML SOPN Inject 2 mg as directed once a week.   Syringe, Disposable, (B-D SYRINGE LUER-LOK 3CC) 3 ML MISC Use to inject testosterone every 14 days   Vardenafil HCl 10 MG TBDP PLACE 1 TABLET ON TONGUE AS NEEDED, 1 HOUR BEFORE INTERCOURSE   VENTOLIN HFA 108 (90 Base) MCG/ACT inhaler INHALE 2 PUFFS INTO THE LUNGS EVERY 4 HOURS AS NEEDED FOR WHEEZING OR SHORTNESS OF BREATH.   [DISCONTINUED] atorvastatin (LIPITOR) 40 MG tablet TAKE 1 TABLET BY MOUTH EVERY DAY   [DISCONTINUED] azithromycin (ZITHROMAX Z-PAK) 250 MG tablet Take  2 tablets (500 mg) on  Day 1,  followed by 1 tablet (250 mg) once daily on Days 2 through 5.   [DISCONTINUED] buPROPion (WELLBUTRIN XL) 300 MG 24 hr tablet TAKE 1 TABLET BY MOUTH EVERY DAY   [DISCONTINUED] chlorpheniramine-HYDROcodone (TUSSIONEX) 10-8 MG/5ML Take 5 mLs by mouth every 12 (twelve) hours as needed.   [DISCONTINUED] FLUoxetine (PROZAC) 20 MG capsule TAKE 4 CAPSULES (80 MG TOTAL) BY MOUTH DAILY. NEEDS APPT   [DISCONTINUED] fluticasone (FLONASE) 50 MCG/ACT nasal spray SPRAY 2 SPRAYS INTO EACH NOSTRIL EVERY DAY   [DISCONTINUED] hydrOXYzine (VISTARIL) 50 MG capsule Take 1 capsule (50 mg total) by mouth 3 (three) times daily as needed. Reported on 10/07/2015   [DISCONTINUED] testosterone cypionate (DEPOTESTOSTERONE CYPIONATE) 200 MG/ML injection INJECT 1 ML INTO THE MUSCLE EVERY 14 DAYS. PLEASE INCLUDE SYRINGES AND 22G 1.5" NEEDLES   [DISCONTINUED] VRAYLAR 1.5 MG capsule TAKE 1 CAPSULE BY MOUTH EVERY DAY   No facility-administered medications prior to visit.   Review of Systems  All other systems reviewed and are negative.     Objective:     BP 122/80 (BP Location: Right Arm, Patient Position: Sitting, Cuff Size: Normal)   Pulse 76   SpO2 100%  BP Readings from Last 3 Encounters:  11/20/23 122/80  09/22/23 131/86  11/30/22 127/85   Wt Readings from Last 3 Encounters:  09/22/23 (!) 309 lb (140.2 kg)  11/30/22 (!) 303 lb (137.4 kg)   04/06/22 283 lb (128.4 kg)           Assessment & Plan:    Routine Health Maintenance and Physical Exam  Immunization History  Administered Date(s) Administered   Influenza Inj Mdck Quad Pf 06/22/2022   Influenza,inj,Quad PF,6+ Mos 07/06/2015, 06/01/2016, 07/12/2017, 05/29/2018, 08/18/2020, 11/01/2021   Influenza-Unspecified 06/16/2014   PFIZER(Purple Top)SARS-COV-2 Vaccination 04/16/2020, 05/07/2020   PNEUMOCOCCAL CONJUGATE-20 11/20/2023   PPD Test 12/31/2014   Tdap 11/20/2023    Health Maintenance  Topic Date Due   OPHTHALMOLOGY EXAM  Never done   Diabetic kidney evaluation - Urine ACR  02/01/2023   FOOT EXAM  02/01/2023   COVID-19 Vaccine (3 - 2024-25 season) 12/06/2023 (Originally 05/21/2023)   INFLUENZA VACCINE  12/18/2023 (Originally 04/20/2023)   HEMOGLOBIN A1C  05/22/2024   Diabetic kidney evaluation - eGFR measurement  11/19/2024   Colonoscopy  12/28/2025   DTaP/Tdap/Td (2 - Td or Tdap) 11/19/2033   Pneumococcal Vaccine 45-49 Years old  Completed   Hepatitis C Screening  Completed   HIV Screening  Completed   HPV VACCINES  Aged Out    Discussed health benefits of physical activity, and encouraged him to engage in regular exercise appropriate for his age and condition. Elijah Ford was seen today for annual exam.  Diagnoses and all orders for this visit:  Routine physical examination -     PSA -     Basic Metabolic Panel (BMET) -     Lipid panel -     CBC -     Hemoglobin A1c  Prostate cancer screening -     PSA  Screening for lipid disorders -     Lipid panel  Screening for diabetes mellitus -     Basic Metabolic Panel (BMET)  Prediabetes -     Hemoglobin A1c  Hyperlipidemia LDL goal <70 -     Lipid panel -     atorvastatin (LIPITOR) 40 MG tablet; Take 1 tablet (40 mg total) by mouth daily.  Essential hypertension, benign -     Basic  Metabolic Panel (BMET)  Encounter for immunization -     Tdap vaccine greater than or equal to 7yo IM -      Pneumococcal conjugate vaccine 20-valent  Moderate episode of recurrent major depressive disorder (HCC) -     buPROPion (WELLBUTRIN XL) 300 MG 24 hr tablet; Take 1 tablet (300 mg total) by mouth daily. -     FLUoxetine (PROZAC) 20 MG capsule; Take 4 capsules (80 mg total) by mouth daily. -     cariprazine (VRAYLAR) 1.5 MG capsule; Take 1 capsule (1.5 mg total) by mouth daily.  Male hypogonadism -     testosterone cypionate (DEPOTESTOSTERONE CYPIONATE) 200 MG/ML injection; Inject 1 mL (200 mg total) into the muscle every 14 (fourteen) days. INJECT 1 ML INTO THE MUSCLE EVERY 14 DAYS. PLEASE INCLUDE SYRINGES AND 22G 1.5" NEEDLES   Fasting lab ordered today PHQ no concerns, refilled medications Vitals look great Declined covid 19 but received Tdap and pneumonia vaccine today Reminder to schedule DM eye exam Could not get urine today for microalbumin     Tandy Gaw, PA-C

## 2023-11-20 NOTE — Patient Instructions (Signed)
 Health Maintenance, Male  Adopting a healthy lifestyle and getting preventive care are important in promoting health and wellness. Ask your health care provider about:  The right schedule for you to have regular tests and exams.  Things you can do on your own to prevent diseases and keep yourself healthy.  What should I know about diet, weight, and exercise?  Eat a healthy diet    Eat a diet that includes plenty of vegetables, fruits, low-fat dairy products, and lean protein.  Do not eat a lot of foods that are high in solid fats, added sugars, or sodium.  Maintain a healthy weight  Body mass index (BMI) is a measurement that can be used to identify possible weight problems. It estimates body fat based on height and weight. Your health care provider can help determine your BMI and help you achieve or maintain a healthy weight.  Get regular exercise  Get regular exercise. This is one of the most important things you can do for your health. Most adults should:  Exercise for at least 150 minutes each week. The exercise should increase your heart rate and make you sweat (moderate-intensity exercise).  Do strengthening exercises at least twice a week. This is in addition to the moderate-intensity exercise.  Spend less time sitting. Even light physical activity can be beneficial.  Watch cholesterol and blood lipids  Have your blood tested for lipids and cholesterol at 49 years of age, then have this test every 5 years.  You may need to have your cholesterol levels checked more often if:  Your lipid or cholesterol levels are high.  You are older than 49 years of age.  You are at high risk for heart disease.  What should I know about cancer screening?  Many types of cancers can be detected early and may often be prevented. Depending on your health history and family history, you may need to have cancer screening at various ages. This may include screening for:  Colorectal cancer.  Prostate cancer.  Skin cancer.  Lung  cancer.  What should I know about heart disease, diabetes, and high blood pressure?  Blood pressure and heart disease  High blood pressure causes heart disease and increases the risk of stroke. This is more likely to develop in people who have high blood pressure readings or are overweight.  Talk with your health care provider about your target blood pressure readings.  Have your blood pressure checked:  Every 3-5 years if you are 9-95 years of age.  Every year if you are 85 years old or older.  If you are between the ages of 29 and 29 and are a current or former smoker, ask your health care provider if you should have a one-time screening for abdominal aortic aneurysm (AAA).  Diabetes  Have regular diabetes screenings. This checks your fasting blood sugar level. Have the screening done:  Once every three years after age 23 if you are at a normal weight and have a low risk for diabetes.  More often and at a younger age if you are overweight or have a high risk for diabetes.  What should I know about preventing infection?  Hepatitis B  If you have a higher risk for hepatitis B, you should be screened for this virus. Talk with your health care provider to find out if you are at risk for hepatitis B infection.  Hepatitis C  Blood testing is recommended for:  Everyone born from [birth year] through 1966.  Anyone  with known risk factors for hepatitis C.  Sexually transmitted infections (STIs)  You should be screened each year for STIs, including gonorrhea and chlamydia, if:  You are sexually active and are younger than 49 years of age.  You are older than 49 years of age and your health care provider tells you that you are at risk for this type of infection.  Your sexual activity has changed since you were last screened, and you are at increased risk for chlamydia or gonorrhea. Ask your health care provider if you are at risk.  Ask your health care provider about whether you are at high risk for HIV. Your health care provider  may recommend a prescription medicine to help prevent HIV infection. If you choose to take medicine to prevent HIV, you should first get tested for HIV. You should then be tested every 3 months for as long as you are taking the medicine.  Follow these instructions at home:  Alcohol use  Do not drink alcohol if your health care provider tells you not to drink.  If you drink alcohol:  Limit how much you have to 0-2 drinks a day.  Know how much alcohol is in your drink. In the [place] one drink equals one 12 oz bottle of beer (355 mL), one 5 oz glass of wine (148 mL), or one 1 oz glass of hard liquor (44 mL).  Lifestyle  Do not use any products that contain nicotine or tobacco. These products include cigarettes, chewing tobacco, and vaping devices, such as e-cigarettes. If you need help quitting, ask your health care provider.  Do not use street drugs.  Do not share needles.  Ask your health care provider for help if you need support or information about quitting drugs.  General instructions  Schedule regular health, dental, and eye exams.  Stay current with your vaccines.  Tell your health care provider if:  You often feel depressed.  You have ever been abused or do not feel safe at home.  Summary  Adopting a healthy lifestyle and getting preventive care are important in promoting health and wellness.  Follow your health care provider's instructions about healthy diet, exercising, and getting tested or screened for diseases.  Follow your health care provider's instructions on monitoring your cholesterol and blood pressure.  This information is not intended to replace advice given to you by your health care provider. Make sure you discuss any questions you have with your health care provider.  Document Revised: 01/25/2021 Document Reviewed: 01/25/2021  Elsevier Patient Education  2024 [employer].

## 2023-11-21 ENCOUNTER — Encounter: Payer: Self-pay | Admitting: Physician Assistant

## 2023-11-21 LAB — LIPID PANEL
Chol/HDL Ratio: 5.1 ratio — ABNORMAL HIGH (ref 0.0–5.0)
Cholesterol, Total: 219 mg/dL — ABNORMAL HIGH (ref 100–199)
HDL: 43 mg/dL (ref >39)
LDL Chol Calc (NIH): 137 mg/dL — ABNORMAL HIGH (ref 0–99)
Triglycerides: 216 mg/dL — ABNORMAL HIGH (ref 0–149)
VLDL Cholesterol Cal: 39 mg/dL (ref 5–40)

## 2023-11-21 LAB — BASIC METABOLIC PANEL
BUN/Creatinine Ratio: 10 (ref 9–20)
BUN: 14 mg/dL (ref 6–24)
CO2: 24 mmol/L (ref 20–29)
Calcium: 9.7 mg/dL (ref 8.7–10.2)
Chloride: 103 mmol/L (ref 96–106)
Creatinine, Ser: 1.44 mg/dL — ABNORMAL HIGH (ref 0.76–1.27)
Glucose: 92 mg/dL (ref 70–99)
Potassium: 4.4 mmol/L (ref 3.5–5.2)
Sodium: 140 mmol/L (ref 134–144)
eGFR: 60 mL/min/{1.73_m2} (ref >59)

## 2023-11-21 LAB — CBC
Hematocrit: 53.1 % — ABNORMAL HIGH (ref 37.5–51.0)
Hemoglobin: 18.3 g/dL — ABNORMAL HIGH (ref 13.0–17.7)
MCH: 30.7 pg (ref 26.6–33.0)
MCHC: 34.5 g/dL (ref 31.5–35.7)
MCV: 89 fL (ref 79–97)
Platelets: 261 10*3/uL (ref 150–450)
RBC: 5.97 x10E6/uL — ABNORMAL HIGH (ref 4.14–5.80)
RDW: 14.1 % (ref 11.6–15.4)
WBC: 6 10*3/uL (ref 3.4–10.8)

## 2023-11-21 LAB — HEMOGLOBIN A1C
Est. average glucose Bld gHb Est-mCnc: 128 mg/dL
Hgb A1c MFr Bld: 6.1 % — ABNORMAL HIGH (ref 4.8–5.6)

## 2023-11-21 LAB — PSA: Prostate Specific Ag, Serum: 1.1 ng/mL (ref 0.0–4.0)

## 2023-11-21 MED ORDER — BUPROPION HCL ER (XL) 300 MG PO TB24: 300.0000 mg | ORAL_TABLET | Freq: Every day | ORAL | 1 refills | Status: DC

## 2023-11-21 MED ORDER — CARIPRAZINE HCL 1.5 MG PO CAPS: 1.5000 mg | ORAL_CAPSULE | Freq: Every day | ORAL | 1 refills | Status: DC

## 2023-11-21 MED ORDER — ATORVASTATIN CALCIUM 40 MG PO TABS: 40.0000 mg | ORAL_TABLET | Freq: Every day | ORAL | 3 refills | Status: AC

## 2023-11-21 MED ORDER — FLUOXETINE HCL 20 MG PO CAPS: 80.0000 mg | ORAL_CAPSULE | Freq: Every day | ORAL | 1 refills | Status: DC

## 2023-11-21 MED ORDER — TESTOSTERONE CYPIONATE 200 MG/ML IM SOLN: 200.0000 mg | INTRAMUSCULAR | 1 refills | Status: AC

## 2023-11-21 NOTE — Progress Notes (Signed)
 Elijah Ford,   Kidney function a little better.  Prostate looks great.   Hematocrit and hemoglobin still up some. Did you just have testosterone shot? I certainly believe that is what is causing the elevation.   Sugars up some A1C up to 6.1 pre-diabetes range. Are you still taking ozempic 2mg ?   Cholesterol still not optimal. Are you taking lipitor daily?   Marland KitchenMarland KitchenThe 10-year ASCVD risk score (Arnett DK, et al., 2019) is: 6.7%   Values used to calculate the score:     Age: 48 years     Sex: Male     Is Non-Hispanic African American: No     Diabetic: Yes     Tobacco smoker: No     Systolic Blood Pressure: 122 mmHg     Is BP treated: No     HDL Cholesterol: 43 mg/dL     Total Cholesterol: 219 mg/dL

## 2023-12-05 ENCOUNTER — Encounter: Payer: Self-pay | Admitting: Physician Assistant

## 2023-12-05 DIAGNOSIS — F331 Major depressive disorder, recurrent, moderate: Secondary | ICD-10-CM

## 2023-12-06 ENCOUNTER — Other Ambulatory Visit (HOSPITAL_BASED_OUTPATIENT_CLINIC_OR_DEPARTMENT_OTHER): Payer: Self-pay

## 2023-12-06 MED ORDER — CARIPRAZINE HCL 1.5 MG PO CAPS
1.5000 mg | ORAL_CAPSULE | Freq: Every day | ORAL | 1 refills | Status: DC
  Filled 2023-12-06 – 2024-02-28 (×2): qty 90, 90d supply, fill #0
  Filled 2024-06-09: qty 90, 90d supply, fill #1

## 2023-12-15 ENCOUNTER — Other Ambulatory Visit (HOSPITAL_COMMUNITY): Payer: Self-pay

## 2023-12-15 ENCOUNTER — Telehealth: Payer: Self-pay | Admitting: Pharmacy Technician

## 2023-12-15 NOTE — Telephone Encounter (Signed)
 Pharmacy Patient Advocate Encounter   Received notification from CoverMyMeds that prior authorization for Testosterone Cypionate 200MG /ML intramuscular solution is required/requested.   Insurance verification completed.   The patient is insured through [facility] .   Per test claim: PT DOES NOT HAVE LABS TO SUPPORT PA. PREVIOUS CHART NOTES STATES MED IS FILLED USING [facility]. VERIFIED WITH PHARMACY PT USES [facility] AND GETS 2 VIALS FOR $32.00.

## 2023-12-17 ENCOUNTER — Other Ambulatory Visit: Payer: Self-pay | Admitting: Physician Assistant

## 2023-12-17 DIAGNOSIS — J0140 Acute pansinusitis, unspecified: Secondary | ICD-10-CM

## 2023-12-17 DIAGNOSIS — J069 Acute upper respiratory infection, unspecified: Secondary | ICD-10-CM

## 2024-01-23 ENCOUNTER — Other Ambulatory Visit: Payer: Self-pay | Admitting: Physician Assistant

## 2024-01-24 ENCOUNTER — Other Ambulatory Visit (HOSPITAL_BASED_OUTPATIENT_CLINIC_OR_DEPARTMENT_OTHER): Payer: Self-pay

## 2024-01-24 MED ORDER — OZEMPIC (2 MG/DOSE) 8 MG/3ML ~~LOC~~ SOPN
2.0000 mg | PEN_INJECTOR | SUBCUTANEOUS | 0 refills | Status: DC
Start: 2024-01-24 — End: 2024-07-16
  Filled 2024-01-24: qty 3, 28d supply, fill #0
  Filled 2024-02-28: qty 3, 28d supply, fill #1
  Filled 2024-03-29 – 2024-04-19 (×2): qty 3, 28d supply, fill #2

## 2024-01-25 ENCOUNTER — Telehealth: Payer: Self-pay

## 2024-01-25 ENCOUNTER — Other Ambulatory Visit (HOSPITAL_BASED_OUTPATIENT_CLINIC_OR_DEPARTMENT_OTHER): Payer: Self-pay

## 2024-01-25 NOTE — Telephone Encounter (Signed)
 Pharmacy Patient Advocate Encounter   Received notification from CoverMyMeds that prior authorization for Ozempic  (2 MG/DOSE) 8MG /3ML pen-injectors is required/requested.   Insurance verification completed.   The patient is insured through [facility] .   Per test claim: PA required; PA submitted to above mentioned insurance via CoverMyMeds Key/confirmation #/EOC [id number] Status is pending

## 2024-01-26 ENCOUNTER — Other Ambulatory Visit (HOSPITAL_COMMUNITY): Payer: Self-pay

## 2024-01-26 NOTE — Telephone Encounter (Signed)
 Pharmacy Patient Advocate Encounter  Received notification from [facility] that Prior Authorization for Ozempic  (2 MG/DOSE) 8MG /3ML pen-injectors has been APPROVED from 01/25/2024 to 01/24/2025. Ran test claim, Copay is $25.00. This test claim was processed through [facility]- copay amounts may vary at other pharmacies due to pharmacy/plan contracts, or as the patient moves through the different stages of their insurance plan.   PA #/Case ID/Reference #: Key: [id number]

## 2024-01-29 ENCOUNTER — Other Ambulatory Visit: Payer: Self-pay

## 2024-01-29 ENCOUNTER — Other Ambulatory Visit (HOSPITAL_BASED_OUTPATIENT_CLINIC_OR_DEPARTMENT_OTHER): Payer: Self-pay

## 2024-02-28 ENCOUNTER — Other Ambulatory Visit (HOSPITAL_BASED_OUTPATIENT_CLINIC_OR_DEPARTMENT_OTHER): Payer: Self-pay

## 2024-03-26 DIAGNOSIS — G4733 Obstructive sleep apnea (adult) (pediatric): Secondary | ICD-10-CM | POA: Diagnosis not present

## 2024-04-05 ENCOUNTER — Other Ambulatory Visit: Payer: Self-pay | Admitting: Physician Assistant

## 2024-04-05 DIAGNOSIS — R059 Cough, unspecified: Secondary | ICD-10-CM

## 2024-04-09 ENCOUNTER — Other Ambulatory Visit (HOSPITAL_BASED_OUTPATIENT_CLINIC_OR_DEPARTMENT_OTHER): Payer: Self-pay

## 2024-04-19 ENCOUNTER — Other Ambulatory Visit (HOSPITAL_BASED_OUTPATIENT_CLINIC_OR_DEPARTMENT_OTHER): Payer: Self-pay

## 2024-04-26 DIAGNOSIS — G4733 Obstructive sleep apnea (adult) (pediatric): Secondary | ICD-10-CM | POA: Diagnosis not present

## 2024-05-21 ENCOUNTER — Encounter: Payer: Self-pay | Admitting: Sports Medicine

## 2024-05-27 DIAGNOSIS — G4733 Obstructive sleep apnea (adult) (pediatric): Secondary | ICD-10-CM | POA: Diagnosis not present

## 2024-06-21 ENCOUNTER — Encounter: Payer: Self-pay | Admitting: Physician Assistant

## 2024-06-21 MED ORDER — SCOPOLAMINE 1 MG/3DAYS TD PT72: 1.0000 | MEDICATED_PATCH | TRANSDERMAL | 0 refills | Status: DC

## 2024-06-26 ENCOUNTER — Other Ambulatory Visit: Payer: Self-pay | Admitting: Physician Assistant

## 2024-06-26 MED ORDER — AZITHROMYCIN 250 MG PO TABS: ORAL_TABLET | ORAL | 0 refills | Status: AC

## 2024-06-26 NOTE — Progress Notes (Signed)
 Leaving for cruise in 2 days. Sick contact with grandchildren and wife. Starting with URI symptoms. Start tylenol  cold/sinus severe and flonase . Use zpak if not improving.

## 2024-07-16 ENCOUNTER — Other Ambulatory Visit: Payer: Self-pay | Admitting: Physician Assistant

## 2024-07-17 ENCOUNTER — Other Ambulatory Visit (HOSPITAL_BASED_OUTPATIENT_CLINIC_OR_DEPARTMENT_OTHER): Payer: Self-pay

## 2024-07-17 MED ORDER — OZEMPIC (2 MG/DOSE) 8 MG/3ML ~~LOC~~ SOPN
2.0000 mg | PEN_INJECTOR | SUBCUTANEOUS | 0 refills | Status: AC
  Filled 2024-07-17 (×2): qty 9, 84d supply, fill #0

## 2024-07-18 ENCOUNTER — Other Ambulatory Visit: Payer: Self-pay | Admitting: Physician Assistant

## 2024-08-02 ENCOUNTER — Telehealth: Payer: Self-pay

## 2024-08-02 NOTE — Telephone Encounter (Signed)
 Attempted to contact patient in regards to scheduling a diabetic follow-up appointment. Unfortunately I was sent to voicemail. I was able to leave a message requesting a call back to our office and advising a MyChart message would also be sent in regards to the voice message.

## 2024-08-23 ENCOUNTER — Other Ambulatory Visit: Payer: Self-pay | Admitting: Physician Assistant

## 2024-08-23 DIAGNOSIS — K219 Gastro-esophageal reflux disease without esophagitis: Secondary | ICD-10-CM

## 2024-09-10 ENCOUNTER — Other Ambulatory Visit: Payer: Self-pay | Admitting: Physician Assistant

## 2024-09-10 DIAGNOSIS — F331 Major depressive disorder, recurrent, moderate: Secondary | ICD-10-CM

## 2024-09-17 ENCOUNTER — Other Ambulatory Visit: Payer: Self-pay | Admitting: Physician Assistant

## 2024-09-17 DIAGNOSIS — F331 Major depressive disorder, recurrent, moderate: Secondary | ICD-10-CM

## 2024-09-20 ENCOUNTER — Other Ambulatory Visit (HOSPITAL_BASED_OUTPATIENT_CLINIC_OR_DEPARTMENT_OTHER): Payer: Self-pay

## 2024-09-20 MED ORDER — CARIPRAZINE HCL 1.5 MG PO CAPS
1.5000 mg | ORAL_CAPSULE | Freq: Every day | ORAL | 1 refills | Status: AC
  Filled 2024-09-20: qty 90, 90d supply, fill #0

## 2024-09-25 ENCOUNTER — Encounter: Payer: Self-pay | Admitting: Physician Assistant
